# Patient Record
Sex: Female | Born: 1945
Health system: Southern US, Community
[De-identification: ages and names within clinical notes are randomized; demographics above are authoritative.]

## PROBLEM LIST (undated history)

## (undated) DIAGNOSIS — E785 Hyperlipidemia, unspecified: Secondary | ICD-10-CM

## (undated) DIAGNOSIS — M4800 Spinal stenosis, site unspecified: Secondary | ICD-10-CM

## (undated) DIAGNOSIS — I10 Essential (primary) hypertension: Secondary | ICD-10-CM

## (undated) DIAGNOSIS — F419 Anxiety disorder, unspecified: Secondary | ICD-10-CM

## (undated) DIAGNOSIS — E119 Type 2 diabetes mellitus without complications: Secondary | ICD-10-CM

## (undated) DIAGNOSIS — E039 Hypothyroidism, unspecified: Secondary | ICD-10-CM

## (undated) HISTORY — DX: Essential (primary) hypertension: I10

## (undated) HISTORY — DX: Hyperlipidemia, unspecified: E78.5

## (undated) HISTORY — DX: Type 2 diabetes mellitus without complications: E11.9

## (undated) HISTORY — DX: Spinal stenosis, site unspecified: M48.00

## (undated) HISTORY — DX: Hypothyroidism, unspecified: E03.9

## (undated) HISTORY — PX: TONSILLECTOMY: SUR1361

## (undated) HISTORY — DX: Anxiety disorder, unspecified: F41.9

## (undated) HISTORY — PX: APPENDECTOMY: SHX54

## (undated) HISTORY — PX: SALIVARY GLAND SURGERY: SHX768

---

## 1998-07-13 ENCOUNTER — Other Ambulatory Visit: Admission: RE | Admit: 1998-07-13 | Discharge: 1998-07-13 | Payer: Self-pay | Admitting: Obstetrics & Gynecology

## 2000-07-22 ENCOUNTER — Other Ambulatory Visit: Admission: RE | Admit: 2000-07-22 | Discharge: 2000-07-22 | Payer: Self-pay | Admitting: Obstetrics & Gynecology

## 2002-10-07 ENCOUNTER — Other Ambulatory Visit: Admission: RE | Admit: 2002-10-07 | Discharge: 2002-10-07 | Payer: Self-pay | Admitting: Gastroenterology

## 2003-12-04 ENCOUNTER — Other Ambulatory Visit: Admission: RE | Admit: 2003-12-04 | Discharge: 2003-12-04 | Payer: Self-pay | Admitting: Obstetrics & Gynecology

## 2005-12-09 ENCOUNTER — Encounter: Admission: RE | Admit: 2005-12-09 | Discharge: 2005-12-09 | Payer: Self-pay | Admitting: Internal Medicine

## 2006-01-09 ENCOUNTER — Encounter: Admission: RE | Admit: 2006-01-09 | Discharge: 2006-01-09 | Payer: Self-pay | Admitting: Neurosurgery

## 2006-01-27 ENCOUNTER — Encounter: Admission: RE | Admit: 2006-01-27 | Discharge: 2006-01-27 | Payer: Self-pay | Admitting: Internal Medicine

## 2006-02-12 ENCOUNTER — Encounter: Admission: RE | Admit: 2006-02-12 | Discharge: 2006-02-12 | Payer: Self-pay | Admitting: Internal Medicine

## 2006-07-13 ENCOUNTER — Encounter: Admission: RE | Admit: 2006-07-13 | Discharge: 2006-10-11 | Payer: Self-pay | Admitting: Internal Medicine

## 2006-08-26 ENCOUNTER — Encounter: Admission: RE | Admit: 2006-08-26 | Discharge: 2006-08-26 | Payer: Self-pay | Admitting: Neurosurgery

## 2006-10-06 ENCOUNTER — Encounter: Admission: RE | Admit: 2006-10-06 | Discharge: 2006-10-06 | Payer: Self-pay | Admitting: Neurosurgery

## 2007-10-12 ENCOUNTER — Emergency Department (HOSPITAL_COMMUNITY): Admission: EM | Admit: 2007-10-12 | Discharge: 2007-10-12 | Payer: Self-pay | Admitting: Emergency Medicine

## 2009-10-16 ENCOUNTER — Ambulatory Visit (HOSPITAL_COMMUNITY): Admission: RE | Admit: 2009-10-16 | Discharge: 2009-10-16 | Payer: Self-pay | Admitting: Dermatology

## 2010-01-19 ENCOUNTER — Encounter: Admission: RE | Admit: 2010-01-19 | Discharge: 2010-01-19 | Payer: Self-pay | Admitting: Rheumatology

## 2010-06-14 ENCOUNTER — Encounter: Admission: RE | Admit: 2010-06-14 | Discharge: 2010-06-14 | Payer: Self-pay | Admitting: Internal Medicine

## 2010-06-24 ENCOUNTER — Encounter: Admission: RE | Admit: 2010-06-24 | Discharge: 2010-06-24 | Payer: Self-pay | Admitting: Internal Medicine

## 2010-07-17 ENCOUNTER — Ambulatory Visit: Payer: Self-pay | Admitting: Obstetrics and Gynecology

## 2011-05-01 ENCOUNTER — Inpatient Hospital Stay (INDEPENDENT_AMBULATORY_CARE_PROVIDER_SITE_OTHER)
Admission: RE | Admit: 2011-05-01 | Discharge: 2011-05-01 | Disposition: A | Discharge status: Home / Self Care | Payer: Self-pay | Source: Ambulatory Visit | Attending: Emergency Medicine | Admitting: Emergency Medicine

## 2011-05-01 DIAGNOSIS — S139XXA Sprain of joints and ligaments of unspecified parts of neck, initial encounter: Secondary | ICD-10-CM

## 2011-05-01 DIAGNOSIS — M461 Sacroiliitis, not elsewhere classified: Secondary | ICD-10-CM

## 2011-12-18 ENCOUNTER — Other Ambulatory Visit (HOSPITAL_COMMUNITY): Payer: Self-pay | Admitting: Internal Medicine

## 2011-12-18 DIAGNOSIS — J449 Chronic obstructive pulmonary disease, unspecified: Secondary | ICD-10-CM

## 2011-12-25 ENCOUNTER — Ambulatory Visit (HOSPITAL_COMMUNITY)
Admission: RE | Admit: 2011-12-25 | Discharge: 2011-12-25 | Disposition: A | Discharge status: Home / Self Care | Payer: Medicare Other | Source: Ambulatory Visit | Attending: Internal Medicine | Admitting: Internal Medicine

## 2011-12-25 ENCOUNTER — Encounter (HOSPITAL_COMMUNITY): Payer: Self-pay

## 2011-12-25 DIAGNOSIS — J449 Chronic obstructive pulmonary disease, unspecified: Secondary | ICD-10-CM | POA: Insufficient documentation

## 2011-12-25 DIAGNOSIS — J4489 Other specified chronic obstructive pulmonary disease: Secondary | ICD-10-CM | POA: Insufficient documentation

## 2011-12-25 DIAGNOSIS — F17200 Nicotine dependence, unspecified, uncomplicated: Secondary | ICD-10-CM | POA: Insufficient documentation

## 2012-02-24 ENCOUNTER — Other Ambulatory Visit: Payer: Self-pay | Admitting: Gastroenterology

## 2012-08-13 ENCOUNTER — Other Ambulatory Visit: Payer: Self-pay | Admitting: Internal Medicine

## 2012-08-13 DIAGNOSIS — M549 Dorsalgia, unspecified: Secondary | ICD-10-CM

## 2012-08-19 ENCOUNTER — Ambulatory Visit
Admission: RE | Admit: 2012-08-19 | Discharge: 2012-08-19 | Disposition: A | Discharge status: Home / Self Care | Payer: Medicare Other | Source: Ambulatory Visit | Attending: Internal Medicine | Admitting: Internal Medicine

## 2012-08-19 VITALS — BP 114/63 | HR 82

## 2012-08-19 DIAGNOSIS — M5126 Other intervertebral disc displacement, lumbar region: Secondary | ICD-10-CM

## 2012-08-19 DIAGNOSIS — M549 Dorsalgia, unspecified: Secondary | ICD-10-CM

## 2012-08-19 MED ORDER — METHYLPREDNISOLONE ACETATE 40 MG/ML INJ SUSP (RADIOLOG
120.0000 mg | Freq: Once | INTRAMUSCULAR | Status: AC
  Administered 2012-08-19: 120 mg via EPIDURAL

## 2012-08-19 MED ORDER — IOHEXOL 180 MG/ML  SOLN
1.0000 mL | Freq: Once | INTRAMUSCULAR | Status: AC | PRN
  Administered 2012-08-19: 1 mL via EPIDURAL

## 2013-06-20 DIAGNOSIS — I10 Essential (primary) hypertension: Secondary | ICD-10-CM | POA: Insufficient documentation

## 2013-06-20 DIAGNOSIS — M4802 Spinal stenosis, cervical region: Secondary | ICD-10-CM | POA: Insufficient documentation

## 2013-06-20 DIAGNOSIS — E785 Hyperlipidemia, unspecified: Secondary | ICD-10-CM | POA: Insufficient documentation

## 2013-06-20 DIAGNOSIS — M4800 Spinal stenosis, site unspecified: Secondary | ICD-10-CM | POA: Insufficient documentation

## 2013-06-20 DIAGNOSIS — F419 Anxiety disorder, unspecified: Secondary | ICD-10-CM | POA: Insufficient documentation

## 2014-02-02 ENCOUNTER — Other Ambulatory Visit: Payer: Self-pay | Admitting: Orthopedic Surgery

## 2014-02-02 DIAGNOSIS — M545 Low back pain, unspecified: Secondary | ICD-10-CM

## 2014-02-21 ENCOUNTER — Other Ambulatory Visit: Payer: Self-pay | Admitting: Internal Medicine

## 2014-02-21 DIAGNOSIS — M549 Dorsalgia, unspecified: Secondary | ICD-10-CM

## 2014-02-28 ENCOUNTER — Other Ambulatory Visit: Payer: Medicare Other

## 2014-03-03 ENCOUNTER — Ambulatory Visit
Admission: RE | Admit: 2014-03-03 | Discharge: 2014-03-03 | Disposition: A | Discharge status: Home / Self Care | Payer: Commercial Managed Care - HMO | Source: Ambulatory Visit | Attending: Internal Medicine | Admitting: Internal Medicine

## 2014-03-03 DIAGNOSIS — M549 Dorsalgia, unspecified: Secondary | ICD-10-CM

## 2014-03-03 MED ORDER — METHYLPREDNISOLONE ACETATE 40 MG/ML INJ SUSP (RADIOLOG
120.0000 mg | Freq: Once | INTRAMUSCULAR | Status: AC
  Administered 2014-03-03: 120 mg via EPIDURAL

## 2014-03-03 MED ORDER — IOHEXOL 180 MG/ML  SOLN
1.0000 mL | Freq: Once | INTRAMUSCULAR | Status: AC | PRN
  Administered 2014-03-03 (×2): 1 mL via EPIDURAL

## 2014-03-03 NOTE — Discharge Instructions (Signed)

## 2014-04-19 ENCOUNTER — Encounter: Payer: Self-pay | Admitting: *Deleted

## 2014-11-09 ENCOUNTER — Other Ambulatory Visit: Payer: Self-pay | Admitting: Internal Medicine

## 2014-11-09 DIAGNOSIS — M5412 Radiculopathy, cervical region: Secondary | ICD-10-CM

## 2014-11-09 DIAGNOSIS — M545 Low back pain: Secondary | ICD-10-CM | POA: Diagnosis not present

## 2014-11-13 ENCOUNTER — Ambulatory Visit
Admission: RE | Admit: 2014-11-13 | Discharge: 2014-11-13 | Disposition: A | Discharge status: Home / Self Care | Payer: Commercial Managed Care - HMO | Source: Ambulatory Visit | Attending: Internal Medicine | Admitting: Internal Medicine

## 2014-11-13 DIAGNOSIS — M47812 Spondylosis without myelopathy or radiculopathy, cervical region: Secondary | ICD-10-CM | POA: Diagnosis not present

## 2014-11-13 DIAGNOSIS — S199XXA Unspecified injury of neck, initial encounter: Secondary | ICD-10-CM | POA: Diagnosis not present

## 2014-11-13 DIAGNOSIS — M5412 Radiculopathy, cervical region: Secondary | ICD-10-CM

## 2014-11-13 DIAGNOSIS — M5032 Other cervical disc degeneration, mid-cervical region: Secondary | ICD-10-CM | POA: Diagnosis not present

## 2014-11-13 DIAGNOSIS — M5033 Other cervical disc degeneration, cervicothoracic region: Secondary | ICD-10-CM | POA: Diagnosis not present

## 2014-11-16 ENCOUNTER — Other Ambulatory Visit: Payer: Medicare Other

## 2014-11-27 DIAGNOSIS — M542 Cervicalgia: Secondary | ICD-10-CM | POA: Diagnosis not present

## 2014-11-27 DIAGNOSIS — M069 Rheumatoid arthritis, unspecified: Secondary | ICD-10-CM | POA: Diagnosis not present

## 2014-11-27 DIAGNOSIS — I10 Essential (primary) hypertension: Secondary | ICD-10-CM | POA: Diagnosis not present

## 2014-11-29 ENCOUNTER — Other Ambulatory Visit: Payer: Self-pay | Admitting: Internal Medicine

## 2014-11-29 DIAGNOSIS — M48061 Spinal stenosis, lumbar region without neurogenic claudication: Secondary | ICD-10-CM

## 2014-12-01 ENCOUNTER — Ambulatory Visit
Admission: RE | Admit: 2014-12-01 | Discharge: 2014-12-01 | Disposition: A | Discharge status: Home / Self Care | Payer: Commercial Managed Care - HMO | Source: Ambulatory Visit | Attending: Internal Medicine | Admitting: Internal Medicine

## 2014-12-01 DIAGNOSIS — M48061 Spinal stenosis, lumbar region without neurogenic claudication: Secondary | ICD-10-CM

## 2014-12-01 DIAGNOSIS — M4806 Spinal stenosis, lumbar region: Secondary | ICD-10-CM | POA: Diagnosis not present

## 2014-12-01 MED ORDER — IOHEXOL 180 MG/ML  SOLN
1.0000 mL | Freq: Once | INTRAMUSCULAR | Status: AC | PRN
  Administered 2014-12-01: 1 mL via EPIDURAL

## 2014-12-01 MED ORDER — METHYLPREDNISOLONE ACETATE 40 MG/ML INJ SUSP (RADIOLOG
120.0000 mg | Freq: Once | INTRAMUSCULAR | Status: AC
  Administered 2014-12-01: 120 mg via EPIDURAL

## 2014-12-01 NOTE — Discharge Instructions (Signed)

## 2014-12-25 DIAGNOSIS — M205X1 Other deformities of toe(s) (acquired), right foot: Secondary | ICD-10-CM | POA: Diagnosis not present

## 2014-12-25 DIAGNOSIS — M205X2 Other deformities of toe(s) (acquired), left foot: Secondary | ICD-10-CM | POA: Diagnosis not present

## 2014-12-25 DIAGNOSIS — Z131 Encounter for screening for diabetes mellitus: Secondary | ICD-10-CM | POA: Diagnosis not present

## 2014-12-25 DIAGNOSIS — M1990 Unspecified osteoarthritis, unspecified site: Secondary | ICD-10-CM | POA: Diagnosis not present

## 2014-12-25 DIAGNOSIS — L97519 Non-pressure chronic ulcer of other part of right foot with unspecified severity: Secondary | ICD-10-CM | POA: Diagnosis not present

## 2014-12-29 ENCOUNTER — Other Ambulatory Visit: Payer: Self-pay | Admitting: Neurosurgery

## 2014-12-29 DIAGNOSIS — M542 Cervicalgia: Secondary | ICD-10-CM

## 2015-01-05 ENCOUNTER — Ambulatory Visit
Admission: RE | Admit: 2015-01-05 | Discharge: 2015-01-05 | Disposition: A | Discharge status: Home / Self Care | Payer: Commercial Managed Care - HMO | Source: Ambulatory Visit | Attending: Neurosurgery | Admitting: Neurosurgery

## 2015-01-05 DIAGNOSIS — M542 Cervicalgia: Secondary | ICD-10-CM

## 2015-01-05 MED ORDER — IOHEXOL 300 MG/ML  SOLN
1.0000 mL | Freq: Once | INTRAMUSCULAR | Status: AC | PRN
  Administered 2015-01-05: 1 mL via EPIDURAL

## 2015-01-05 MED ORDER — TRIAMCINOLONE ACETONIDE 40 MG/ML IJ SUSP (RADIOLOGY)
60.0000 mg | Freq: Once | INTRAMUSCULAR | Status: AC
  Administered 2015-01-05: 60 mg via EPIDURAL

## 2015-01-05 NOTE — Discharge Instructions (Signed)

## 2015-02-28 DIAGNOSIS — M1990 Unspecified osteoarthritis, unspecified site: Secondary | ICD-10-CM | POA: Diagnosis not present

## 2015-02-28 DIAGNOSIS — Z0000 Encounter for general adult medical examination without abnormal findings: Secondary | ICD-10-CM | POA: Diagnosis not present

## 2015-02-28 DIAGNOSIS — K219 Gastro-esophageal reflux disease without esophagitis: Secondary | ICD-10-CM | POA: Diagnosis not present

## 2015-02-28 DIAGNOSIS — J449 Chronic obstructive pulmonary disease, unspecified: Secondary | ICD-10-CM | POA: Diagnosis not present

## 2015-02-28 DIAGNOSIS — F325 Major depressive disorder, single episode, in full remission: Secondary | ICD-10-CM | POA: Diagnosis not present

## 2015-02-28 DIAGNOSIS — E039 Hypothyroidism, unspecified: Secondary | ICD-10-CM | POA: Diagnosis not present

## 2015-02-28 DIAGNOSIS — M797 Fibromyalgia: Secondary | ICD-10-CM | POA: Diagnosis not present

## 2015-02-28 DIAGNOSIS — I10 Essential (primary) hypertension: Secondary | ICD-10-CM | POA: Diagnosis not present

## 2015-04-11 DIAGNOSIS — H524 Presbyopia: Secondary | ICD-10-CM | POA: Diagnosis not present

## 2015-04-11 DIAGNOSIS — H5210 Myopia, unspecified eye: Secondary | ICD-10-CM | POA: Diagnosis not present

## 2015-05-29 DIAGNOSIS — Z1231 Encounter for screening mammogram for malignant neoplasm of breast: Secondary | ICD-10-CM | POA: Diagnosis not present

## 2015-09-04 DIAGNOSIS — F419 Anxiety disorder, unspecified: Secondary | ICD-10-CM | POA: Diagnosis not present

## 2015-09-04 DIAGNOSIS — E782 Mixed hyperlipidemia: Secondary | ICD-10-CM | POA: Diagnosis not present

## 2015-09-04 DIAGNOSIS — I10 Essential (primary) hypertension: Secondary | ICD-10-CM | POA: Diagnosis not present

## 2015-09-04 DIAGNOSIS — J309 Allergic rhinitis, unspecified: Secondary | ICD-10-CM | POA: Diagnosis not present

## 2015-09-04 DIAGNOSIS — F325 Major depressive disorder, single episode, in full remission: Secondary | ICD-10-CM | POA: Diagnosis not present

## 2015-09-04 DIAGNOSIS — E039 Hypothyroidism, unspecified: Secondary | ICD-10-CM | POA: Diagnosis not present

## 2015-09-04 DIAGNOSIS — J449 Chronic obstructive pulmonary disease, unspecified: Secondary | ICD-10-CM | POA: Diagnosis not present

## 2015-11-01 DIAGNOSIS — M5431 Sciatica, right side: Secondary | ICD-10-CM | POA: Diagnosis not present

## 2015-11-01 DIAGNOSIS — M9903 Segmental and somatic dysfunction of lumbar region: Secondary | ICD-10-CM | POA: Diagnosis not present

## 2015-11-01 DIAGNOSIS — M546 Pain in thoracic spine: Secondary | ICD-10-CM | POA: Diagnosis not present

## 2015-11-01 DIAGNOSIS — M9902 Segmental and somatic dysfunction of thoracic region: Secondary | ICD-10-CM | POA: Diagnosis not present

## 2015-11-08 DIAGNOSIS — M9902 Segmental and somatic dysfunction of thoracic region: Secondary | ICD-10-CM | POA: Diagnosis not present

## 2015-11-08 DIAGNOSIS — M9903 Segmental and somatic dysfunction of lumbar region: Secondary | ICD-10-CM | POA: Diagnosis not present

## 2015-11-08 DIAGNOSIS — M546 Pain in thoracic spine: Secondary | ICD-10-CM | POA: Diagnosis not present

## 2015-11-08 DIAGNOSIS — M5431 Sciatica, right side: Secondary | ICD-10-CM | POA: Diagnosis not present

## 2015-11-15 DIAGNOSIS — M546 Pain in thoracic spine: Secondary | ICD-10-CM | POA: Diagnosis not present

## 2015-11-15 DIAGNOSIS — M9902 Segmental and somatic dysfunction of thoracic region: Secondary | ICD-10-CM | POA: Diagnosis not present

## 2015-11-15 DIAGNOSIS — M5431 Sciatica, right side: Secondary | ICD-10-CM | POA: Diagnosis not present

## 2015-11-15 DIAGNOSIS — M9903 Segmental and somatic dysfunction of lumbar region: Secondary | ICD-10-CM | POA: Diagnosis not present

## 2015-11-22 DIAGNOSIS — M9903 Segmental and somatic dysfunction of lumbar region: Secondary | ICD-10-CM | POA: Diagnosis not present

## 2015-11-22 DIAGNOSIS — M546 Pain in thoracic spine: Secondary | ICD-10-CM | POA: Diagnosis not present

## 2015-11-22 DIAGNOSIS — M5431 Sciatica, right side: Secondary | ICD-10-CM | POA: Diagnosis not present

## 2015-11-22 DIAGNOSIS — M9902 Segmental and somatic dysfunction of thoracic region: Secondary | ICD-10-CM | POA: Diagnosis not present

## 2015-12-06 DIAGNOSIS — M546 Pain in thoracic spine: Secondary | ICD-10-CM | POA: Diagnosis not present

## 2015-12-06 DIAGNOSIS — M9902 Segmental and somatic dysfunction of thoracic region: Secondary | ICD-10-CM | POA: Diagnosis not present

## 2015-12-06 DIAGNOSIS — M5431 Sciatica, right side: Secondary | ICD-10-CM | POA: Diagnosis not present

## 2015-12-06 DIAGNOSIS — M9903 Segmental and somatic dysfunction of lumbar region: Secondary | ICD-10-CM | POA: Diagnosis not present

## 2016-01-02 DIAGNOSIS — M546 Pain in thoracic spine: Secondary | ICD-10-CM | POA: Diagnosis not present

## 2016-01-02 DIAGNOSIS — M9903 Segmental and somatic dysfunction of lumbar region: Secondary | ICD-10-CM | POA: Diagnosis not present

## 2016-01-02 DIAGNOSIS — M5431 Sciatica, right side: Secondary | ICD-10-CM | POA: Diagnosis not present

## 2016-01-02 DIAGNOSIS — M9902 Segmental and somatic dysfunction of thoracic region: Secondary | ICD-10-CM | POA: Diagnosis not present

## 2016-01-17 DIAGNOSIS — M9903 Segmental and somatic dysfunction of lumbar region: Secondary | ICD-10-CM | POA: Diagnosis not present

## 2016-01-17 DIAGNOSIS — M9902 Segmental and somatic dysfunction of thoracic region: Secondary | ICD-10-CM | POA: Diagnosis not present

## 2016-01-17 DIAGNOSIS — M546 Pain in thoracic spine: Secondary | ICD-10-CM | POA: Diagnosis not present

## 2016-01-17 DIAGNOSIS — M5431 Sciatica, right side: Secondary | ICD-10-CM | POA: Diagnosis not present

## 2016-01-23 DIAGNOSIS — M542 Cervicalgia: Secondary | ICD-10-CM | POA: Diagnosis not present

## 2016-01-23 DIAGNOSIS — M5412 Radiculopathy, cervical region: Secondary | ICD-10-CM | POA: Diagnosis not present

## 2016-01-24 ENCOUNTER — Other Ambulatory Visit: Payer: Self-pay | Admitting: Nurse Practitioner

## 2016-01-24 DIAGNOSIS — M5412 Radiculopathy, cervical region: Secondary | ICD-10-CM

## 2016-01-24 DIAGNOSIS — M542 Cervicalgia: Secondary | ICD-10-CM

## 2016-02-04 ENCOUNTER — Ambulatory Visit
Admission: RE | Admit: 2016-02-04 | Discharge: 2016-02-04 | Disposition: A | Discharge status: Home / Self Care | Payer: Commercial Managed Care - HMO | Source: Ambulatory Visit | Attending: Nurse Practitioner | Admitting: Nurse Practitioner

## 2016-02-04 ENCOUNTER — Encounter: Payer: Self-pay | Admitting: Radiology

## 2016-02-04 DIAGNOSIS — M47812 Spondylosis without myelopathy or radiculopathy, cervical region: Secondary | ICD-10-CM | POA: Diagnosis not present

## 2016-02-04 DIAGNOSIS — M542 Cervicalgia: Secondary | ICD-10-CM

## 2016-02-04 DIAGNOSIS — M5412 Radiculopathy, cervical region: Secondary | ICD-10-CM

## 2016-02-04 MED ORDER — IOHEXOL 300 MG/ML  SOLN
1.0000 mL | Freq: Once | INTRAMUSCULAR | Status: AC | PRN
  Administered 2016-02-04: 1 mL via EPIDURAL

## 2016-02-04 MED ORDER — TRIAMCINOLONE ACETONIDE 40 MG/ML IJ SUSP (RADIOLOGY)
60.0000 mg | Freq: Once | INTRAMUSCULAR | Status: AC
  Administered 2016-02-04: 60 mg via EPIDURAL

## 2016-02-04 NOTE — Discharge Instructions (Signed)

## 2016-02-14 DIAGNOSIS — M546 Pain in thoracic spine: Secondary | ICD-10-CM | POA: Diagnosis not present

## 2016-02-14 DIAGNOSIS — M9902 Segmental and somatic dysfunction of thoracic region: Secondary | ICD-10-CM | POA: Diagnosis not present

## 2016-02-14 DIAGNOSIS — M5431 Sciatica, right side: Secondary | ICD-10-CM | POA: Diagnosis not present

## 2016-02-14 DIAGNOSIS — M9903 Segmental and somatic dysfunction of lumbar region: Secondary | ICD-10-CM | POA: Diagnosis not present

## 2016-02-18 ENCOUNTER — Other Ambulatory Visit: Payer: Commercial Managed Care - HMO

## 2016-03-05 DIAGNOSIS — Z0000 Encounter for general adult medical examination without abnormal findings: Secondary | ICD-10-CM | POA: Diagnosis not present

## 2016-03-05 DIAGNOSIS — K219 Gastro-esophageal reflux disease without esophagitis: Secondary | ICD-10-CM | POA: Diagnosis not present

## 2016-03-05 DIAGNOSIS — M5090 Cervical disc disorder, unspecified, unspecified cervical region: Secondary | ICD-10-CM | POA: Diagnosis not present

## 2016-03-05 DIAGNOSIS — M797 Fibromyalgia: Secondary | ICD-10-CM | POA: Diagnosis not present

## 2016-03-05 DIAGNOSIS — R739 Hyperglycemia, unspecified: Secondary | ICD-10-CM | POA: Diagnosis not present

## 2016-03-05 DIAGNOSIS — Z1389 Encounter for screening for other disorder: Secondary | ICD-10-CM | POA: Diagnosis not present

## 2016-03-05 DIAGNOSIS — I10 Essential (primary) hypertension: Secondary | ICD-10-CM | POA: Diagnosis not present

## 2016-03-05 DIAGNOSIS — J449 Chronic obstructive pulmonary disease, unspecified: Secondary | ICD-10-CM | POA: Diagnosis not present

## 2016-03-05 DIAGNOSIS — F325 Major depressive disorder, single episode, in full remission: Secondary | ICD-10-CM | POA: Diagnosis not present

## 2016-03-05 DIAGNOSIS — E782 Mixed hyperlipidemia: Secondary | ICD-10-CM | POA: Diagnosis not present

## 2016-03-05 DIAGNOSIS — E039 Hypothyroidism, unspecified: Secondary | ICD-10-CM | POA: Diagnosis not present

## 2016-03-05 DIAGNOSIS — F419 Anxiety disorder, unspecified: Secondary | ICD-10-CM | POA: Diagnosis not present

## 2016-03-21 DIAGNOSIS — M9903 Segmental and somatic dysfunction of lumbar region: Secondary | ICD-10-CM | POA: Diagnosis not present

## 2016-03-21 DIAGNOSIS — M546 Pain in thoracic spine: Secondary | ICD-10-CM | POA: Diagnosis not present

## 2016-03-21 DIAGNOSIS — M9902 Segmental and somatic dysfunction of thoracic region: Secondary | ICD-10-CM | POA: Diagnosis not present

## 2016-03-21 DIAGNOSIS — M5431 Sciatica, right side: Secondary | ICD-10-CM | POA: Diagnosis not present

## 2016-04-22 DIAGNOSIS — M546 Pain in thoracic spine: Secondary | ICD-10-CM | POA: Diagnosis not present

## 2016-04-22 DIAGNOSIS — M9902 Segmental and somatic dysfunction of thoracic region: Secondary | ICD-10-CM | POA: Diagnosis not present

## 2016-04-22 DIAGNOSIS — M9903 Segmental and somatic dysfunction of lumbar region: Secondary | ICD-10-CM | POA: Diagnosis not present

## 2016-04-22 DIAGNOSIS — M5431 Sciatica, right side: Secondary | ICD-10-CM | POA: Diagnosis not present

## 2016-05-06 DIAGNOSIS — H5210 Myopia, unspecified eye: Secondary | ICD-10-CM | POA: Diagnosis not present

## 2016-05-06 DIAGNOSIS — H524 Presbyopia: Secondary | ICD-10-CM | POA: Diagnosis not present

## 2016-05-09 LAB — GLUCOSE, POCT (MANUAL RESULT ENTRY): POC Glucose: 104 mg/dl — AB (ref 70–99)

## 2016-05-26 DIAGNOSIS — M5431 Sciatica, right side: Secondary | ICD-10-CM | POA: Diagnosis not present

## 2016-05-26 DIAGNOSIS — M9903 Segmental and somatic dysfunction of lumbar region: Secondary | ICD-10-CM | POA: Diagnosis not present

## 2016-05-26 DIAGNOSIS — M546 Pain in thoracic spine: Secondary | ICD-10-CM | POA: Diagnosis not present

## 2016-05-26 DIAGNOSIS — M9902 Segmental and somatic dysfunction of thoracic region: Secondary | ICD-10-CM | POA: Diagnosis not present

## 2016-05-29 DIAGNOSIS — Z1231 Encounter for screening mammogram for malignant neoplasm of breast: Secondary | ICD-10-CM | POA: Diagnosis not present

## 2016-06-01 IMAGING — MR MR CERVICAL SPINE W/O CM
4 of 5 series · 21 of 48 positions shown · non-contrast
Comparison: Cervical radiographs 05/30/2010.  MRI 08/26/2006

CLINICAL DATA: Neck pain.  Recent fall.  Cervical radiculopathy

EXAM:
MRI CERVICAL SPINE WITHOUT CONTRAST
TECHNIQUE: Multiplanar, multisequence MR imaging of the cervical spine was
performed. No intravenous contrast was administered.

[Series 2: T2 · sagittal · 3.0mm · 0.41mm/px · 6 of 12 slices shown (1 of 3)]
[im 1/12]
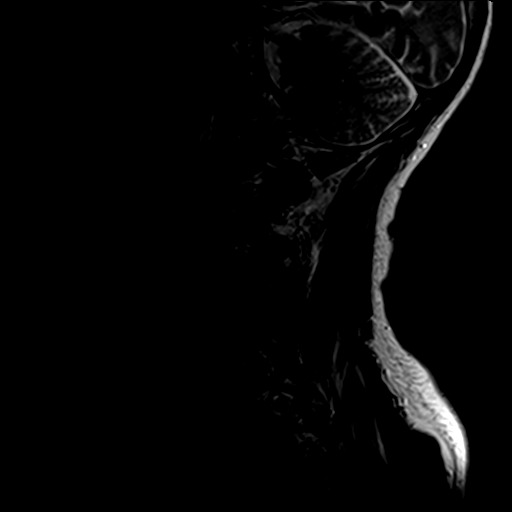
[im 3/12]
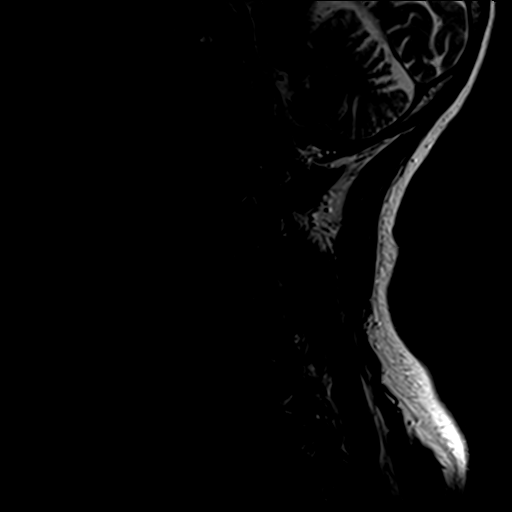
[im 5/12]
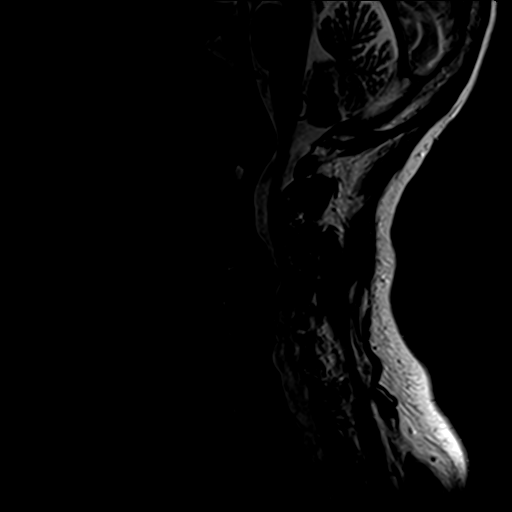
[im 7/12]
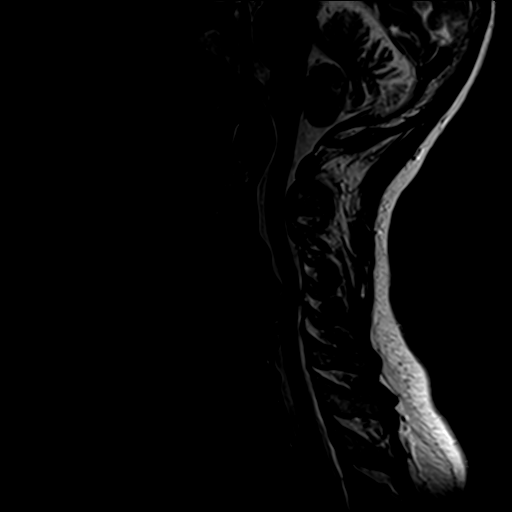
[im 9/12]
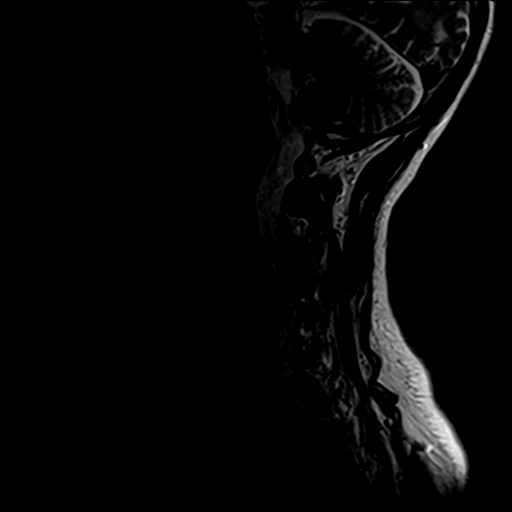
[im 12/12]
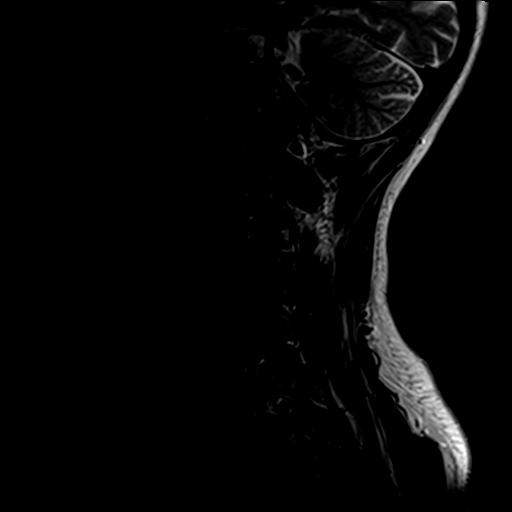

[Series 3: T1 · sagittal · 3.0mm · 0.41mm/px · 3 of 12 slices shown]
[im 3/12]
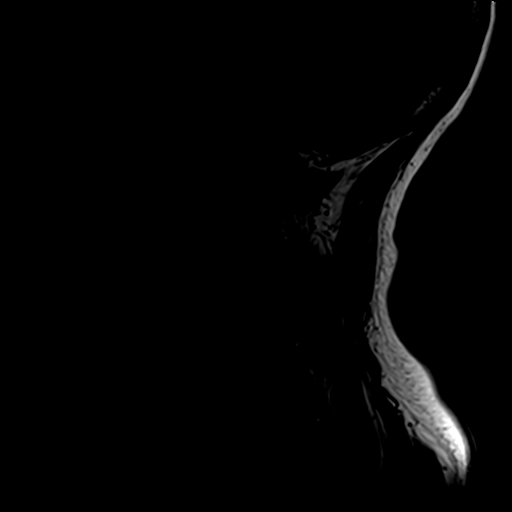
[im 7/12]
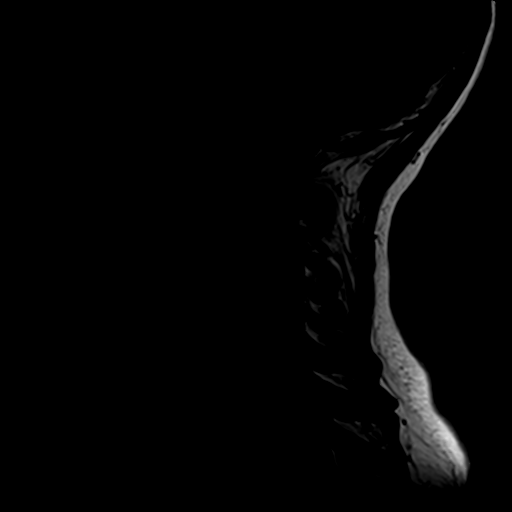
[im 12/12]
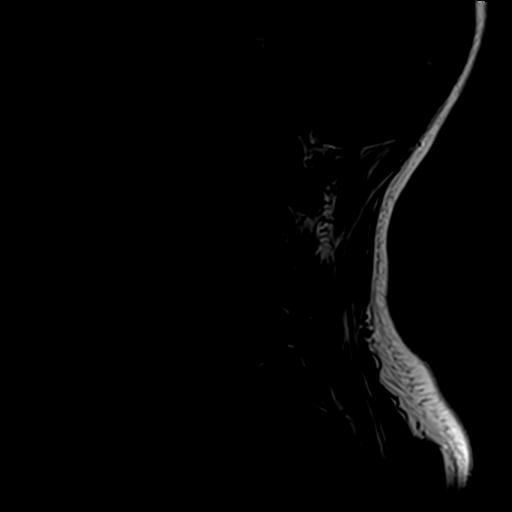

[Series 5: T2 · axial · 3.0mm · 0.28mm/px · z∈[-54,+49]mm · 9 of 30 slices shown (2 of 3)]
[im 1/30]
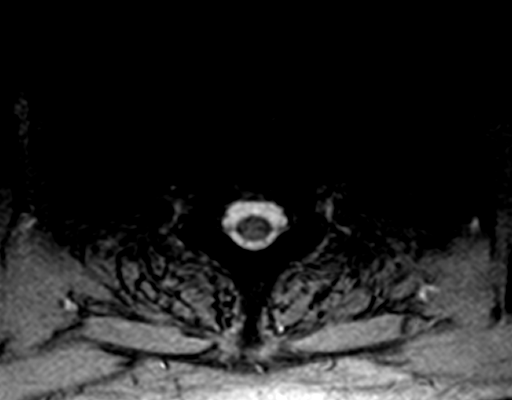
[im 5/30]
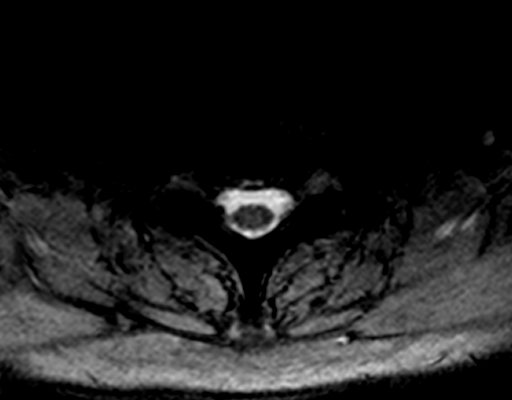
[im 9/30]
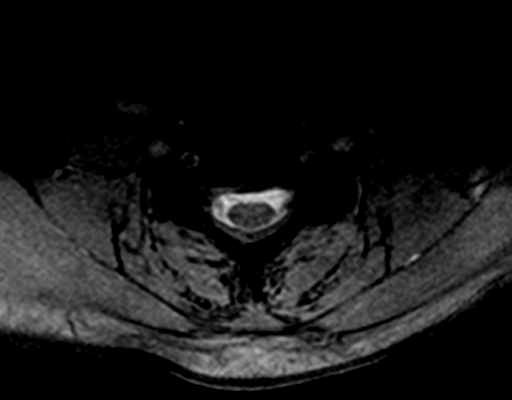
[im 13/30]
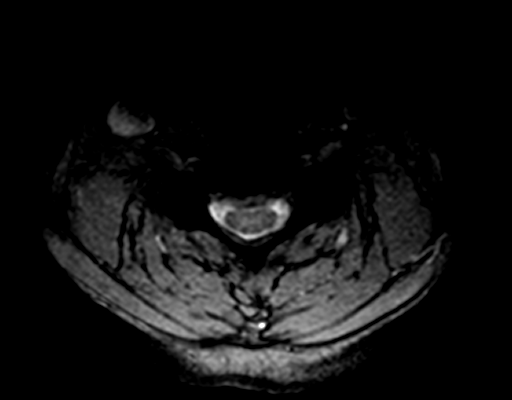
[im 15/30]
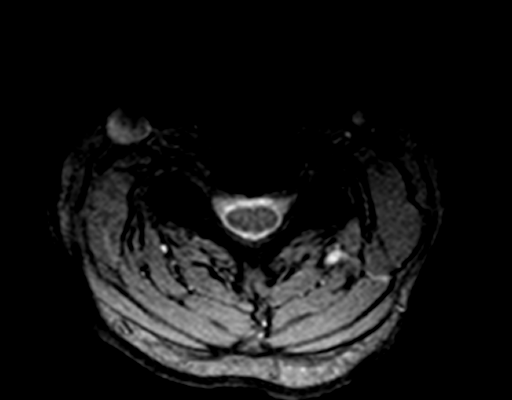
[im 17/30]
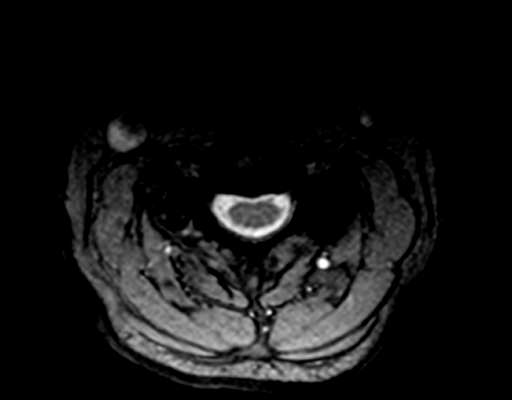
[im 21/30]
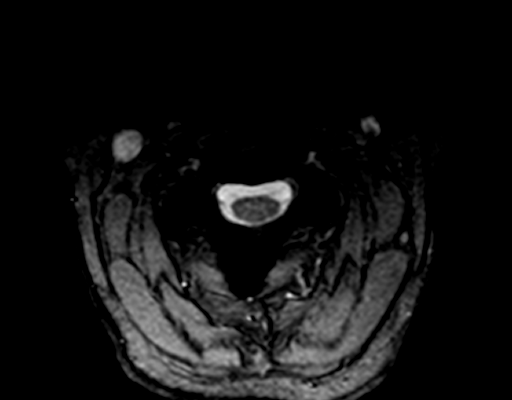
[im 25/30]
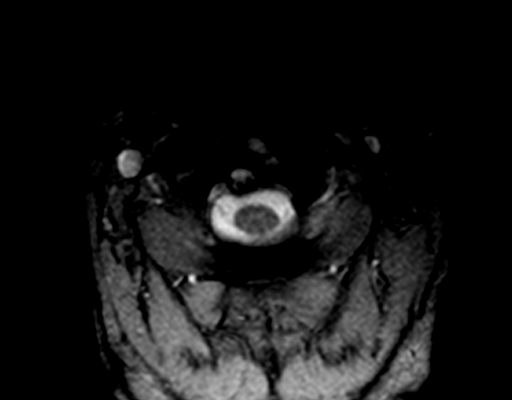
[im 30/30]
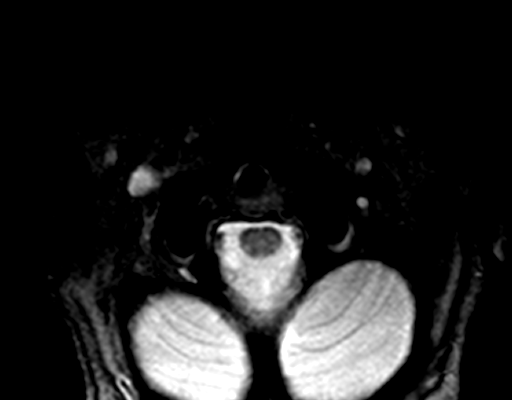

[Series 6: T2 · axial · 3.0mm · 0.28mm/px · z∈[-40,+31]mm · 3 of 30 slices shown (3 of 3)]
[im 5/30]
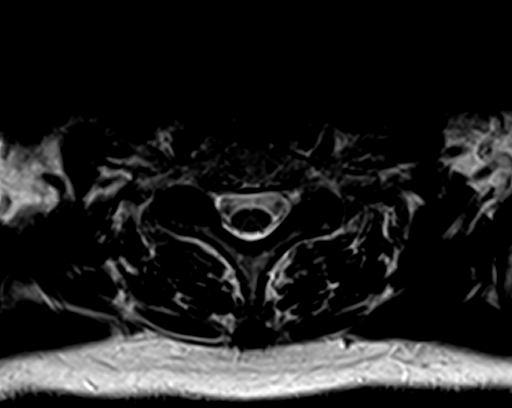
[im 15/30]
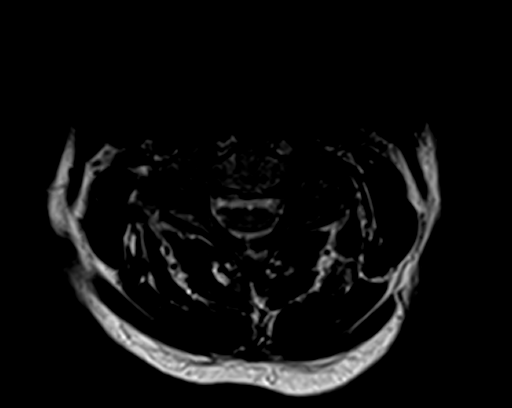
[im 25/30]
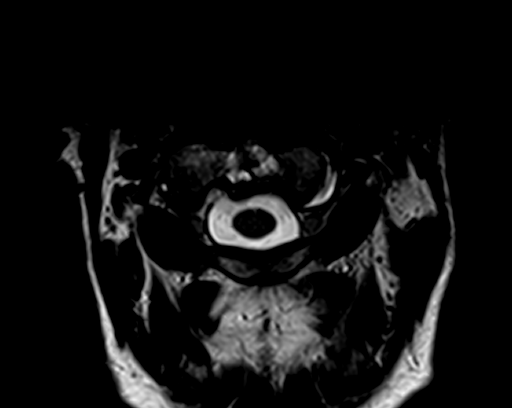

[21 of 48 positions shown; findings below may reference images not displayed]

FINDINGS: 17 mm cystic lesion in the C2 vertebral body at the junction of the
body and dens. This has high signal on T2 and intermediate signal on
T1. Margins are ill-defined. No significant extraosseous component
extending into the canal. However, there is extensive thickening of
the transverse ligament of C1 with posterior displacement of the
cord. No cord compression. Review of the MRI in 4887 reveals 3mm
cyst in this area of C2 with significant interval growth. No other
bone marrow lesions identified.

Cervical disc degeneration and kyphosis.  Cord signal is normal.

C2-3:  Negative

C3-4: 2 mm anterior slip with disc and facet degeneration. No cord
deformity

C4-5: Disc degeneration with disc bulging and spondylosis. Mild to
moderate spinal stenosis and foraminal stenosis bilaterally.

C5-6: Disc degeneration and spondylosis with mild spinal stenosis
and mild foraminal stenosis bilaterally.

C6-7: Moderate disc degeneration and spondylosis. Mild spinal
stenosis and foraminal stenosis bilaterally

C7-T1:  Mild disc degeneration
IMPRESSION: 17 mm cystic lesion in the C2 vertebral body. This may have been
present in 4887 as a very small lesion. Differential includes and
rheumatoid arthritis with bony erosion. Myeloma and metastatic
disease also in the differential. Transverse ligament hypertrophy is
significant and may be related to rheumatoid arthritis. Followup MRI
with intravenous contrast suggested to evaluate for abnormal
enhancement. CT of the cervical spine also suggested to evaluate
bony margins

Multilevel cervical disc degeneration and spondylosis as above.

## 2016-06-05 ENCOUNTER — Other Ambulatory Visit: Payer: Self-pay | Admitting: Internal Medicine

## 2016-06-05 DIAGNOSIS — M519 Unspecified thoracic, thoracolumbar and lumbosacral intervertebral disc disorder: Secondary | ICD-10-CM

## 2016-06-12 ENCOUNTER — Other Ambulatory Visit: Payer: Self-pay | Admitting: Internal Medicine

## 2016-06-12 DIAGNOSIS — M519 Unspecified thoracic, thoracolumbar and lumbosacral intervertebral disc disorder: Secondary | ICD-10-CM

## 2016-06-23 ENCOUNTER — Ambulatory Visit
Admission: RE | Admit: 2016-06-23 | Discharge: 2016-06-23 | Disposition: A | Discharge status: Home / Self Care | Payer: Commercial Managed Care - HMO | Source: Ambulatory Visit | Attending: Internal Medicine | Admitting: Internal Medicine

## 2016-06-23 DIAGNOSIS — M519 Unspecified thoracic, thoracolumbar and lumbosacral intervertebral disc disorder: Secondary | ICD-10-CM

## 2016-06-23 DIAGNOSIS — M4806 Spinal stenosis, lumbar region: Secondary | ICD-10-CM | POA: Diagnosis not present

## 2016-06-23 MED ORDER — IOPAMIDOL (ISOVUE-M 200) INJECTION 41%
1.0000 mL | Freq: Once | INTRAMUSCULAR | Status: AC
  Administered 2016-06-23: 1 mL via EPIDURAL

## 2016-06-23 MED ORDER — METHYLPREDNISOLONE ACETATE 40 MG/ML INJ SUSP (RADIOLOG
120.0000 mg | Freq: Once | INTRAMUSCULAR | Status: AC
  Administered 2016-06-23: 120 mg via EPIDURAL

## 2016-07-10 DIAGNOSIS — Z23 Encounter for immunization: Secondary | ICD-10-CM | POA: Diagnosis not present

## 2016-08-21 DIAGNOSIS — M546 Pain in thoracic spine: Secondary | ICD-10-CM | POA: Diagnosis not present

## 2016-08-21 DIAGNOSIS — M5431 Sciatica, right side: Secondary | ICD-10-CM | POA: Diagnosis not present

## 2016-08-21 DIAGNOSIS — M9903 Segmental and somatic dysfunction of lumbar region: Secondary | ICD-10-CM | POA: Diagnosis not present

## 2016-08-21 DIAGNOSIS — M9902 Segmental and somatic dysfunction of thoracic region: Secondary | ICD-10-CM | POA: Diagnosis not present

## 2016-09-12 DIAGNOSIS — F419 Anxiety disorder, unspecified: Secondary | ICD-10-CM | POA: Diagnosis not present

## 2016-09-12 DIAGNOSIS — M519 Unspecified thoracic, thoracolumbar and lumbosacral intervertebral disc disorder: Secondary | ICD-10-CM | POA: Diagnosis not present

## 2016-09-12 DIAGNOSIS — E039 Hypothyroidism, unspecified: Secondary | ICD-10-CM | POA: Diagnosis not present

## 2016-09-12 DIAGNOSIS — I10 Essential (primary) hypertension: Secondary | ICD-10-CM | POA: Diagnosis not present

## 2016-09-12 DIAGNOSIS — K219 Gastro-esophageal reflux disease without esophagitis: Secondary | ICD-10-CM | POA: Diagnosis not present

## 2016-09-12 DIAGNOSIS — F325 Major depressive disorder, single episode, in full remission: Secondary | ICD-10-CM | POA: Diagnosis not present

## 2016-09-12 DIAGNOSIS — M797 Fibromyalgia: Secondary | ICD-10-CM | POA: Diagnosis not present

## 2016-09-12 DIAGNOSIS — J449 Chronic obstructive pulmonary disease, unspecified: Secondary | ICD-10-CM | POA: Diagnosis not present

## 2016-09-12 DIAGNOSIS — I7300 Raynaud's syndrome without gangrene: Secondary | ICD-10-CM | POA: Diagnosis not present

## 2016-09-16 DIAGNOSIS — M546 Pain in thoracic spine: Secondary | ICD-10-CM | POA: Diagnosis not present

## 2016-09-16 DIAGNOSIS — M9902 Segmental and somatic dysfunction of thoracic region: Secondary | ICD-10-CM | POA: Diagnosis not present

## 2016-09-16 DIAGNOSIS — M9903 Segmental and somatic dysfunction of lumbar region: Secondary | ICD-10-CM | POA: Diagnosis not present

## 2016-09-16 DIAGNOSIS — M5431 Sciatica, right side: Secondary | ICD-10-CM | POA: Diagnosis not present

## 2016-09-23 DIAGNOSIS — M9901 Segmental and somatic dysfunction of cervical region: Secondary | ICD-10-CM | POA: Diagnosis not present

## 2016-09-23 DIAGNOSIS — M542 Cervicalgia: Secondary | ICD-10-CM | POA: Diagnosis not present

## 2016-09-23 DIAGNOSIS — M5030 Other cervical disc degeneration, unspecified cervical region: Secondary | ICD-10-CM | POA: Diagnosis not present

## 2016-09-23 DIAGNOSIS — M47812 Spondylosis without myelopathy or radiculopathy, cervical region: Secondary | ICD-10-CM | POA: Diagnosis not present

## 2016-10-02 DIAGNOSIS — M542 Cervicalgia: Secondary | ICD-10-CM | POA: Diagnosis not present

## 2016-10-02 DIAGNOSIS — M5030 Other cervical disc degeneration, unspecified cervical region: Secondary | ICD-10-CM | POA: Diagnosis not present

## 2016-10-02 DIAGNOSIS — M47812 Spondylosis without myelopathy or radiculopathy, cervical region: Secondary | ICD-10-CM | POA: Diagnosis not present

## 2016-10-02 DIAGNOSIS — M9901 Segmental and somatic dysfunction of cervical region: Secondary | ICD-10-CM | POA: Diagnosis not present

## 2016-10-16 DIAGNOSIS — M9901 Segmental and somatic dysfunction of cervical region: Secondary | ICD-10-CM | POA: Diagnosis not present

## 2016-10-16 DIAGNOSIS — M542 Cervicalgia: Secondary | ICD-10-CM | POA: Diagnosis not present

## 2016-10-16 DIAGNOSIS — M5030 Other cervical disc degeneration, unspecified cervical region: Secondary | ICD-10-CM | POA: Diagnosis not present

## 2016-10-16 DIAGNOSIS — M47812 Spondylosis without myelopathy or radiculopathy, cervical region: Secondary | ICD-10-CM | POA: Diagnosis not present

## 2016-10-30 DIAGNOSIS — R69 Illness, unspecified: Secondary | ICD-10-CM | POA: Diagnosis not present

## 2016-10-31 DIAGNOSIS — R69 Illness, unspecified: Secondary | ICD-10-CM | POA: Diagnosis not present

## 2016-11-04 DIAGNOSIS — R69 Illness, unspecified: Secondary | ICD-10-CM | POA: Diagnosis not present

## 2016-11-06 DIAGNOSIS — M5030 Other cervical disc degeneration, unspecified cervical region: Secondary | ICD-10-CM | POA: Diagnosis not present

## 2016-11-06 DIAGNOSIS — M9901 Segmental and somatic dysfunction of cervical region: Secondary | ICD-10-CM | POA: Diagnosis not present

## 2016-11-06 DIAGNOSIS — M47812 Spondylosis without myelopathy or radiculopathy, cervical region: Secondary | ICD-10-CM | POA: Diagnosis not present

## 2016-11-06 DIAGNOSIS — M542 Cervicalgia: Secondary | ICD-10-CM | POA: Diagnosis not present

## 2016-11-27 DIAGNOSIS — M5030 Other cervical disc degeneration, unspecified cervical region: Secondary | ICD-10-CM | POA: Diagnosis not present

## 2016-11-27 DIAGNOSIS — M542 Cervicalgia: Secondary | ICD-10-CM | POA: Diagnosis not present

## 2016-11-27 DIAGNOSIS — M9901 Segmental and somatic dysfunction of cervical region: Secondary | ICD-10-CM | POA: Diagnosis not present

## 2016-11-27 DIAGNOSIS — M47812 Spondylosis without myelopathy or radiculopathy, cervical region: Secondary | ICD-10-CM | POA: Diagnosis not present

## 2016-12-04 DIAGNOSIS — R69 Illness, unspecified: Secondary | ICD-10-CM | POA: Diagnosis not present

## 2016-12-15 DIAGNOSIS — M797 Fibromyalgia: Secondary | ICD-10-CM | POA: Diagnosis not present

## 2016-12-15 DIAGNOSIS — E782 Mixed hyperlipidemia: Secondary | ICD-10-CM | POA: Diagnosis not present

## 2016-12-15 DIAGNOSIS — R7303 Prediabetes: Secondary | ICD-10-CM | POA: Diagnosis not present

## 2016-12-15 DIAGNOSIS — R69 Illness, unspecified: Secondary | ICD-10-CM | POA: Diagnosis not present

## 2016-12-15 DIAGNOSIS — I10 Essential (primary) hypertension: Secondary | ICD-10-CM | POA: Diagnosis not present

## 2016-12-15 DIAGNOSIS — E039 Hypothyroidism, unspecified: Secondary | ICD-10-CM | POA: Diagnosis not present

## 2016-12-15 DIAGNOSIS — J449 Chronic obstructive pulmonary disease, unspecified: Secondary | ICD-10-CM | POA: Diagnosis not present

## 2017-01-15 DIAGNOSIS — M47812 Spondylosis without myelopathy or radiculopathy, cervical region: Secondary | ICD-10-CM | POA: Diagnosis not present

## 2017-01-15 DIAGNOSIS — M5030 Other cervical disc degeneration, unspecified cervical region: Secondary | ICD-10-CM | POA: Diagnosis not present

## 2017-01-15 DIAGNOSIS — M542 Cervicalgia: Secondary | ICD-10-CM | POA: Diagnosis not present

## 2017-01-15 DIAGNOSIS — M9901 Segmental and somatic dysfunction of cervical region: Secondary | ICD-10-CM | POA: Diagnosis not present

## 2017-02-12 DIAGNOSIS — M9901 Segmental and somatic dysfunction of cervical region: Secondary | ICD-10-CM | POA: Diagnosis not present

## 2017-02-12 DIAGNOSIS — M542 Cervicalgia: Secondary | ICD-10-CM | POA: Diagnosis not present

## 2017-02-12 DIAGNOSIS — M5030 Other cervical disc degeneration, unspecified cervical region: Secondary | ICD-10-CM | POA: Diagnosis not present

## 2017-02-12 DIAGNOSIS — M47812 Spondylosis without myelopathy or radiculopathy, cervical region: Secondary | ICD-10-CM | POA: Diagnosis not present

## 2017-03-09 DIAGNOSIS — J449 Chronic obstructive pulmonary disease, unspecified: Secondary | ICD-10-CM | POA: Diagnosis not present

## 2017-03-09 DIAGNOSIS — E782 Mixed hyperlipidemia: Secondary | ICD-10-CM | POA: Diagnosis not present

## 2017-03-09 DIAGNOSIS — K219 Gastro-esophageal reflux disease without esophagitis: Secondary | ICD-10-CM | POA: Diagnosis not present

## 2017-03-09 DIAGNOSIS — R7309 Other abnormal glucose: Secondary | ICD-10-CM | POA: Diagnosis not present

## 2017-03-09 DIAGNOSIS — E039 Hypothyroidism, unspecified: Secondary | ICD-10-CM | POA: Diagnosis not present

## 2017-03-09 DIAGNOSIS — I10 Essential (primary) hypertension: Secondary | ICD-10-CM | POA: Diagnosis not present

## 2017-03-09 DIAGNOSIS — Z0000 Encounter for general adult medical examination without abnormal findings: Secondary | ICD-10-CM | POA: Diagnosis not present

## 2017-03-09 DIAGNOSIS — R69 Illness, unspecified: Secondary | ICD-10-CM | POA: Diagnosis not present

## 2017-03-09 DIAGNOSIS — M797 Fibromyalgia: Secondary | ICD-10-CM | POA: Diagnosis not present

## 2017-03-26 DIAGNOSIS — M5030 Other cervical disc degeneration, unspecified cervical region: Secondary | ICD-10-CM | POA: Diagnosis not present

## 2017-03-26 DIAGNOSIS — M9901 Segmental and somatic dysfunction of cervical region: Secondary | ICD-10-CM | POA: Diagnosis not present

## 2017-03-26 DIAGNOSIS — M542 Cervicalgia: Secondary | ICD-10-CM | POA: Diagnosis not present

## 2017-03-26 DIAGNOSIS — M47812 Spondylosis without myelopathy or radiculopathy, cervical region: Secondary | ICD-10-CM | POA: Diagnosis not present

## 2017-04-21 DIAGNOSIS — Z780 Asymptomatic menopausal state: Secondary | ICD-10-CM | POA: Diagnosis not present

## 2017-04-21 DIAGNOSIS — M8588 Other specified disorders of bone density and structure, other site: Secondary | ICD-10-CM | POA: Diagnosis not present

## 2017-05-12 DIAGNOSIS — N951 Menopausal and female climacteric states: Secondary | ICD-10-CM | POA: Diagnosis not present

## 2017-05-12 DIAGNOSIS — Z01411 Encounter for gynecological examination (general) (routine) with abnormal findings: Secondary | ICD-10-CM | POA: Diagnosis not present

## 2017-05-12 DIAGNOSIS — Z79890 Hormone replacement therapy (postmenopausal): Secondary | ICD-10-CM | POA: Diagnosis not present

## 2017-05-14 DIAGNOSIS — M9901 Segmental and somatic dysfunction of cervical region: Secondary | ICD-10-CM | POA: Diagnosis not present

## 2017-05-14 DIAGNOSIS — M5030 Other cervical disc degeneration, unspecified cervical region: Secondary | ICD-10-CM | POA: Diagnosis not present

## 2017-05-14 DIAGNOSIS — M542 Cervicalgia: Secondary | ICD-10-CM | POA: Diagnosis not present

## 2017-05-14 DIAGNOSIS — M47812 Spondylosis without myelopathy or radiculopathy, cervical region: Secondary | ICD-10-CM | POA: Diagnosis not present

## 2017-05-26 ENCOUNTER — Other Ambulatory Visit: Payer: Self-pay | Admitting: Internal Medicine

## 2017-05-26 DIAGNOSIS — M545 Low back pain: Principal | ICD-10-CM

## 2017-05-26 DIAGNOSIS — G8929 Other chronic pain: Secondary | ICD-10-CM

## 2017-05-29 DIAGNOSIS — H524 Presbyopia: Secondary | ICD-10-CM | POA: Diagnosis not present

## 2017-06-01 DIAGNOSIS — Z1231 Encounter for screening mammogram for malignant neoplasm of breast: Secondary | ICD-10-CM | POA: Diagnosis not present

## 2017-06-03 ENCOUNTER — Other Ambulatory Visit: Payer: Commercial Managed Care - HMO

## 2017-06-04 ENCOUNTER — Ambulatory Visit
Admission: RE | Admit: 2017-06-04 | Discharge: 2017-06-04 | Disposition: A | Discharge status: Home / Self Care | Payer: Medicare HMO | Source: Ambulatory Visit | Attending: Internal Medicine | Admitting: Internal Medicine

## 2017-06-04 DIAGNOSIS — G8929 Other chronic pain: Secondary | ICD-10-CM

## 2017-06-04 DIAGNOSIS — M545 Low back pain: Principal | ICD-10-CM

## 2017-06-04 DIAGNOSIS — M4316 Spondylolisthesis, lumbar region: Secondary | ICD-10-CM | POA: Diagnosis not present

## 2017-06-04 MED ORDER — METHYLPREDNISOLONE ACETATE 40 MG/ML INJ SUSP (RADIOLOG
120.0000 mg | Freq: Once | INTRAMUSCULAR | Status: AC
  Administered 2017-06-04: 120 mg via EPIDURAL

## 2017-06-04 MED ORDER — IOPAMIDOL (ISOVUE-M 200) INJECTION 41%
1.0000 mL | Freq: Once | INTRAMUSCULAR | Status: AC
  Administered 2017-06-04: 1 mL via EPIDURAL

## 2017-06-04 NOTE — Discharge Instructions (Signed)

## 2017-07-02 DIAGNOSIS — M9901 Segmental and somatic dysfunction of cervical region: Secondary | ICD-10-CM | POA: Diagnosis not present

## 2017-07-02 DIAGNOSIS — M9903 Segmental and somatic dysfunction of lumbar region: Secondary | ICD-10-CM | POA: Diagnosis not present

## 2017-07-02 DIAGNOSIS — M62830 Muscle spasm of back: Secondary | ICD-10-CM | POA: Diagnosis not present

## 2017-07-02 DIAGNOSIS — M545 Low back pain: Secondary | ICD-10-CM | POA: Diagnosis not present

## 2017-07-26 DIAGNOSIS — Z23 Encounter for immunization: Secondary | ICD-10-CM | POA: Diagnosis not present

## 2017-08-26 DIAGNOSIS — E782 Mixed hyperlipidemia: Secondary | ICD-10-CM | POA: Diagnosis not present

## 2017-08-26 DIAGNOSIS — R69 Illness, unspecified: Secondary | ICD-10-CM | POA: Diagnosis not present

## 2017-08-26 DIAGNOSIS — E039 Hypothyroidism, unspecified: Secondary | ICD-10-CM | POA: Diagnosis not present

## 2017-08-26 DIAGNOSIS — J449 Chronic obstructive pulmonary disease, unspecified: Secondary | ICD-10-CM | POA: Diagnosis not present

## 2017-08-26 DIAGNOSIS — I10 Essential (primary) hypertension: Secondary | ICD-10-CM | POA: Diagnosis not present

## 2017-08-26 DIAGNOSIS — M1990 Unspecified osteoarthritis, unspecified site: Secondary | ICD-10-CM | POA: Diagnosis not present

## 2017-09-15 ENCOUNTER — Other Ambulatory Visit: Payer: Self-pay | Admitting: Internal Medicine

## 2017-09-15 DIAGNOSIS — R7303 Prediabetes: Secondary | ICD-10-CM | POA: Diagnosis not present

## 2017-09-15 DIAGNOSIS — R51 Headache: Principal | ICD-10-CM

## 2017-09-15 DIAGNOSIS — I7300 Raynaud's syndrome without gangrene: Secondary | ICD-10-CM | POA: Diagnosis not present

## 2017-09-15 DIAGNOSIS — E039 Hypothyroidism, unspecified: Secondary | ICD-10-CM | POA: Diagnosis not present

## 2017-09-15 DIAGNOSIS — E782 Mixed hyperlipidemia: Secondary | ICD-10-CM | POA: Diagnosis not present

## 2017-09-15 DIAGNOSIS — K219 Gastro-esophageal reflux disease without esophagitis: Secondary | ICD-10-CM | POA: Diagnosis not present

## 2017-09-15 DIAGNOSIS — R519 Headache, unspecified: Secondary | ICD-10-CM

## 2017-09-15 DIAGNOSIS — I10 Essential (primary) hypertension: Secondary | ICD-10-CM | POA: Diagnosis not present

## 2017-09-15 DIAGNOSIS — R69 Illness, unspecified: Secondary | ICD-10-CM | POA: Diagnosis not present

## 2017-09-15 DIAGNOSIS — J449 Chronic obstructive pulmonary disease, unspecified: Secondary | ICD-10-CM | POA: Diagnosis not present

## 2017-09-29 ENCOUNTER — Ambulatory Visit
Admission: RE | Admit: 2017-09-29 | Discharge: 2017-09-29 | Disposition: A | Discharge status: Home / Self Care | Payer: Medicare HMO | Source: Ambulatory Visit | Attending: Internal Medicine | Admitting: Internal Medicine

## 2017-09-29 DIAGNOSIS — R51 Headache: Secondary | ICD-10-CM | POA: Diagnosis not present

## 2017-09-29 DIAGNOSIS — R519 Headache, unspecified: Secondary | ICD-10-CM

## 2017-10-06 DIAGNOSIS — M722 Plantar fascial fibromatosis: Secondary | ICD-10-CM | POA: Diagnosis not present

## 2017-10-06 DIAGNOSIS — M2012 Hallux valgus (acquired), left foot: Secondary | ICD-10-CM | POA: Diagnosis not present

## 2017-10-06 DIAGNOSIS — M2011 Hallux valgus (acquired), right foot: Secondary | ICD-10-CM | POA: Diagnosis not present

## 2017-10-06 DIAGNOSIS — M7752 Other enthesopathy of left foot: Secondary | ICD-10-CM | POA: Diagnosis not present

## 2017-10-06 DIAGNOSIS — M7751 Other enthesopathy of right foot: Secondary | ICD-10-CM | POA: Diagnosis not present

## 2017-10-12 DIAGNOSIS — R69 Illness, unspecified: Secondary | ICD-10-CM | POA: Diagnosis not present

## 2017-11-06 DIAGNOSIS — R69 Illness, unspecified: Secondary | ICD-10-CM | POA: Diagnosis not present

## 2018-02-18 DIAGNOSIS — R69 Illness, unspecified: Secondary | ICD-10-CM | POA: Diagnosis not present

## 2018-02-18 DIAGNOSIS — E039 Hypothyroidism, unspecified: Secondary | ICD-10-CM | POA: Diagnosis not present

## 2018-02-18 DIAGNOSIS — I10 Essential (primary) hypertension: Secondary | ICD-10-CM | POA: Diagnosis not present

## 2018-02-18 DIAGNOSIS — J449 Chronic obstructive pulmonary disease, unspecified: Secondary | ICD-10-CM | POA: Diagnosis not present

## 2018-02-18 DIAGNOSIS — M1990 Unspecified osteoarthritis, unspecified site: Secondary | ICD-10-CM | POA: Diagnosis not present

## 2018-02-18 DIAGNOSIS — E782 Mixed hyperlipidemia: Secondary | ICD-10-CM | POA: Diagnosis not present

## 2018-04-21 DIAGNOSIS — Z0000 Encounter for general adult medical examination without abnormal findings: Secondary | ICD-10-CM | POA: Diagnosis not present

## 2018-04-21 DIAGNOSIS — J449 Chronic obstructive pulmonary disease, unspecified: Secondary | ICD-10-CM | POA: Diagnosis not present

## 2018-04-21 DIAGNOSIS — I7300 Raynaud's syndrome without gangrene: Secondary | ICD-10-CM | POA: Diagnosis not present

## 2018-04-21 DIAGNOSIS — E039 Hypothyroidism, unspecified: Secondary | ICD-10-CM | POA: Diagnosis not present

## 2018-04-21 DIAGNOSIS — M5090 Cervical disc disorder, unspecified, unspecified cervical region: Secondary | ICD-10-CM | POA: Diagnosis not present

## 2018-04-21 DIAGNOSIS — Z1389 Encounter for screening for other disorder: Secondary | ICD-10-CM | POA: Diagnosis not present

## 2018-04-21 DIAGNOSIS — M797 Fibromyalgia: Secondary | ICD-10-CM | POA: Diagnosis not present

## 2018-04-21 DIAGNOSIS — M519 Unspecified thoracic, thoracolumbar and lumbosacral intervertebral disc disorder: Secondary | ICD-10-CM | POA: Diagnosis not present

## 2018-04-21 DIAGNOSIS — E782 Mixed hyperlipidemia: Secondary | ICD-10-CM | POA: Diagnosis not present

## 2018-04-21 DIAGNOSIS — R7303 Prediabetes: Secondary | ICD-10-CM | POA: Diagnosis not present

## 2018-04-21 DIAGNOSIS — R69 Illness, unspecified: Secondary | ICD-10-CM | POA: Diagnosis not present

## 2018-04-21 DIAGNOSIS — I10 Essential (primary) hypertension: Secondary | ICD-10-CM | POA: Diagnosis not present

## 2018-04-21 DIAGNOSIS — Z1159 Encounter for screening for other viral diseases: Secondary | ICD-10-CM | POA: Diagnosis not present

## 2018-05-04 DIAGNOSIS — H524 Presbyopia: Secondary | ICD-10-CM | POA: Diagnosis not present

## 2018-05-24 DIAGNOSIS — Z79890 Hormone replacement therapy (postmenopausal): Secondary | ICD-10-CM | POA: Diagnosis not present

## 2018-05-24 DIAGNOSIS — N951 Menopausal and female climacteric states: Secondary | ICD-10-CM | POA: Diagnosis not present

## 2018-05-31 DIAGNOSIS — J309 Allergic rhinitis, unspecified: Secondary | ICD-10-CM | POA: Diagnosis not present

## 2018-06-02 DIAGNOSIS — Z1231 Encounter for screening mammogram for malignant neoplasm of breast: Secondary | ICD-10-CM | POA: Diagnosis not present

## 2018-07-13 DIAGNOSIS — R69 Illness, unspecified: Secondary | ICD-10-CM | POA: Diagnosis not present

## 2018-08-24 ENCOUNTER — Other Ambulatory Visit: Payer: Self-pay | Admitting: Internal Medicine

## 2018-08-24 DIAGNOSIS — M48062 Spinal stenosis, lumbar region with neurogenic claudication: Secondary | ICD-10-CM

## 2018-09-02 ENCOUNTER — Other Ambulatory Visit: Payer: Medicare HMO

## 2018-09-02 ENCOUNTER — Ambulatory Visit
Admission: RE | Admit: 2018-09-02 | Discharge: 2018-09-02 | Disposition: A | Discharge status: Home / Self Care | Payer: Medicare HMO | Source: Ambulatory Visit | Attending: Internal Medicine | Admitting: Internal Medicine

## 2018-09-02 DIAGNOSIS — M545 Low back pain: Secondary | ICD-10-CM | POA: Diagnosis not present

## 2018-09-02 DIAGNOSIS — M48062 Spinal stenosis, lumbar region with neurogenic claudication: Secondary | ICD-10-CM

## 2018-09-02 MED ORDER — METHYLPREDNISOLONE ACETATE 40 MG/ML INJ SUSP (RADIOLOG
120.0000 mg | Freq: Once | INTRAMUSCULAR | Status: AC
  Administered 2018-09-02: 120 mg via EPIDURAL

## 2018-09-02 MED ORDER — IOPAMIDOL (ISOVUE-M 200) INJECTION 41%
1.0000 mL | Freq: Once | INTRAMUSCULAR | Status: AC
  Administered 2018-09-02: 1 mL via EPIDURAL

## 2018-09-02 NOTE — Discharge Instructions (Signed)

## 2018-10-13 DIAGNOSIS — J449 Chronic obstructive pulmonary disease, unspecified: Secondary | ICD-10-CM | POA: Diagnosis not present

## 2018-10-13 DIAGNOSIS — J309 Allergic rhinitis, unspecified: Secondary | ICD-10-CM | POA: Diagnosis not present

## 2018-10-13 DIAGNOSIS — R69 Illness, unspecified: Secondary | ICD-10-CM | POA: Diagnosis not present

## 2018-10-13 DIAGNOSIS — E039 Hypothyroidism, unspecified: Secondary | ICD-10-CM | POA: Diagnosis not present

## 2018-10-13 DIAGNOSIS — I7300 Raynaud's syndrome without gangrene: Secondary | ICD-10-CM | POA: Diagnosis not present

## 2018-10-13 DIAGNOSIS — K219 Gastro-esophageal reflux disease without esophagitis: Secondary | ICD-10-CM | POA: Diagnosis not present

## 2018-10-13 DIAGNOSIS — I10 Essential (primary) hypertension: Secondary | ICD-10-CM | POA: Diagnosis not present

## 2018-10-13 DIAGNOSIS — M519 Unspecified thoracic, thoracolumbar and lumbosacral intervertebral disc disorder: Secondary | ICD-10-CM | POA: Diagnosis not present

## 2018-10-13 DIAGNOSIS — R7303 Prediabetes: Secondary | ICD-10-CM | POA: Diagnosis not present

## 2018-10-18 DIAGNOSIS — H25013 Cortical age-related cataract, bilateral: Secondary | ICD-10-CM | POA: Diagnosis not present

## 2018-10-18 DIAGNOSIS — H2512 Age-related nuclear cataract, left eye: Secondary | ICD-10-CM | POA: Diagnosis not present

## 2018-10-18 DIAGNOSIS — H2513 Age-related nuclear cataract, bilateral: Secondary | ICD-10-CM | POA: Diagnosis not present

## 2018-10-18 DIAGNOSIS — H25043 Posterior subcapsular polar age-related cataract, bilateral: Secondary | ICD-10-CM | POA: Diagnosis not present

## 2018-11-04 DIAGNOSIS — H2512 Age-related nuclear cataract, left eye: Secondary | ICD-10-CM | POA: Diagnosis not present

## 2018-11-04 DIAGNOSIS — H2513 Age-related nuclear cataract, bilateral: Secondary | ICD-10-CM | POA: Diagnosis not present

## 2018-11-05 DIAGNOSIS — H25043 Posterior subcapsular polar age-related cataract, bilateral: Secondary | ICD-10-CM | POA: Diagnosis not present

## 2018-11-05 DIAGNOSIS — H2511 Age-related nuclear cataract, right eye: Secondary | ICD-10-CM | POA: Diagnosis not present

## 2018-11-05 DIAGNOSIS — H2512 Age-related nuclear cataract, left eye: Secondary | ICD-10-CM | POA: Diagnosis not present

## 2018-11-05 DIAGNOSIS — H2513 Age-related nuclear cataract, bilateral: Secondary | ICD-10-CM | POA: Diagnosis not present

## 2018-11-05 DIAGNOSIS — H25013 Cortical age-related cataract, bilateral: Secondary | ICD-10-CM | POA: Diagnosis not present

## 2018-11-25 DIAGNOSIS — H2511 Age-related nuclear cataract, right eye: Secondary | ICD-10-CM | POA: Diagnosis not present

## 2018-12-14 DIAGNOSIS — R69 Illness, unspecified: Secondary | ICD-10-CM | POA: Diagnosis not present

## 2019-02-01 DIAGNOSIS — H43393 Other vitreous opacities, bilateral: Secondary | ICD-10-CM | POA: Diagnosis not present

## 2019-02-07 DIAGNOSIS — Z0100 Encounter for examination of eyes and vision without abnormal findings: Secondary | ICD-10-CM | POA: Diagnosis not present

## 2019-02-16 MED FILL — LORazepam 0.5 MG TABS: 0.5 | 20 days supply | Qty: 60 | Fill #0

## 2019-05-12 DIAGNOSIS — M5090 Cervical disc disorder, unspecified, unspecified cervical region: Secondary | ICD-10-CM | POA: Diagnosis not present

## 2019-05-12 DIAGNOSIS — J449 Chronic obstructive pulmonary disease, unspecified: Secondary | ICD-10-CM | POA: Diagnosis not present

## 2019-05-12 DIAGNOSIS — E039 Hypothyroidism, unspecified: Secondary | ICD-10-CM | POA: Diagnosis not present

## 2019-05-12 DIAGNOSIS — E782 Mixed hyperlipidemia: Secondary | ICD-10-CM | POA: Diagnosis not present

## 2019-05-12 DIAGNOSIS — M797 Fibromyalgia: Secondary | ICD-10-CM | POA: Diagnosis not present

## 2019-05-12 DIAGNOSIS — I10 Essential (primary) hypertension: Secondary | ICD-10-CM | POA: Diagnosis not present

## 2019-05-12 DIAGNOSIS — R69 Illness, unspecified: Secondary | ICD-10-CM | POA: Diagnosis not present

## 2019-05-12 DIAGNOSIS — M519 Unspecified thoracic, thoracolumbar and lumbosacral intervertebral disc disorder: Secondary | ICD-10-CM | POA: Diagnosis not present

## 2019-05-12 DIAGNOSIS — R7303 Prediabetes: Secondary | ICD-10-CM | POA: Diagnosis not present

## 2019-06-08 DIAGNOSIS — Z1231 Encounter for screening mammogram for malignant neoplasm of breast: Secondary | ICD-10-CM | POA: Diagnosis not present

## 2019-07-02 DIAGNOSIS — R69 Illness, unspecified: Secondary | ICD-10-CM | POA: Diagnosis not present

## 2019-11-16 DIAGNOSIS — J449 Chronic obstructive pulmonary disease, unspecified: Secondary | ICD-10-CM | POA: Diagnosis not present

## 2019-11-16 DIAGNOSIS — M519 Unspecified thoracic, thoracolumbar and lumbosacral intervertebral disc disorder: Secondary | ICD-10-CM | POA: Diagnosis not present

## 2019-11-16 DIAGNOSIS — F325 Major depressive disorder, single episode, in full remission: Secondary | ICD-10-CM | POA: Diagnosis not present

## 2019-11-16 DIAGNOSIS — K219 Gastro-esophageal reflux disease without esophagitis: Secondary | ICD-10-CM | POA: Diagnosis not present

## 2019-11-16 DIAGNOSIS — L97529 Non-pressure chronic ulcer of other part of left foot with unspecified severity: Secondary | ICD-10-CM | POA: Diagnosis not present

## 2019-11-16 DIAGNOSIS — E782 Mixed hyperlipidemia: Secondary | ICD-10-CM | POA: Diagnosis not present

## 2019-11-16 DIAGNOSIS — R7303 Prediabetes: Secondary | ICD-10-CM | POA: Diagnosis not present

## 2019-11-16 DIAGNOSIS — M797 Fibromyalgia: Secondary | ICD-10-CM | POA: Diagnosis not present

## 2019-11-16 DIAGNOSIS — E039 Hypothyroidism, unspecified: Secondary | ICD-10-CM | POA: Diagnosis not present

## 2019-11-16 DIAGNOSIS — I7300 Raynaud's syndrome without gangrene: Secondary | ICD-10-CM | POA: Diagnosis not present

## 2019-11-16 DIAGNOSIS — I10 Essential (primary) hypertension: Secondary | ICD-10-CM | POA: Diagnosis not present

## 2019-11-16 DIAGNOSIS — F419 Anxiety disorder, unspecified: Secondary | ICD-10-CM | POA: Diagnosis not present

## 2019-11-18 ENCOUNTER — Telehealth: Payer: Self-pay

## 2019-11-18 NOTE — Telephone Encounter (Signed)
Patient called concerning a referral to Dr. Sharol Given.  Stated that it was urgent and that she needs an appointment soon.  Cb# [phone].  Please advise.  Thank you.

## 2019-11-24 ENCOUNTER — Ambulatory Visit (INDEPENDENT_AMBULATORY_CARE_PROVIDER_SITE_OTHER): Payer: PPO | Admitting: Orthopedic Surgery

## 2019-11-24 ENCOUNTER — Other Ambulatory Visit: Payer: Self-pay

## 2019-11-24 ENCOUNTER — Ambulatory Visit (INDEPENDENT_AMBULATORY_CARE_PROVIDER_SITE_OTHER): Payer: PPO

## 2019-11-24 ENCOUNTER — Encounter: Payer: Self-pay | Admitting: Orthopedic Surgery

## 2019-11-24 VITALS — Ht 62.0 in | Wt 125.0 lb

## 2019-11-24 DIAGNOSIS — M79672 Pain in left foot: Secondary | ICD-10-CM

## 2019-11-25 LAB — RHEUMATOID FACTOR: Rhuematoid fact SerPl-aCnc: <14 IU/mL (ref <14)

## 2019-11-25 LAB — HEMOGLOBIN A1C
Hgb A1c MFr Bld: 6 % of total Hgb — ABNORMAL HIGH (ref <5.7)
Mean Plasma Glucose: 126 (calc)
eAG (mmol/L): 7 (calc)

## 2019-11-25 LAB — URIC ACID: Uric Acid, Serum: 4.8 mg/dL (ref 2.5–7.0)

## 2019-11-25 LAB — SEDIMENTATION RATE: Sed Rate: 6 mm/h (ref 0–30)

## 2019-11-28 ENCOUNTER — Encounter: Payer: Self-pay | Admitting: Orthopedic Surgery

## 2019-11-28 NOTE — Progress Notes (Signed)
Office Visit Note   Patient: Stefanie Carter           Date of Birth: 23-Feb-1946           MRN: [id number] Visit Date: 11/24/2019              Requested by: No referring provider defined for this encounter. PCP: System, Pcp Not In  Chief Complaint  Patient presents with  . Left Foot - Pain, New Patient (Initial Visit)      HPI: Patient is a 74 year old woman who presents complaining of a painful ulcer beneath the left forefoot.  She states that this started back in December.  She states she is not diabetic she states she has had bleeding from the ulcer uses Band-Aids.  Assessment & Plan: Visit Diagnoses:  1. Left foot pain     Plan: Patient does have what appears to be a insensate neuropathic ulcer will check a hemoglobin A1c a uric acid and a rheumatology panel.  She will bring her shoes at follow-up.  Follow-Up Instructions: Return in about 2 weeks (around 12/08/2019).   Ortho Exam  Patient is alert, oriented, no adenopathy, well-dressed, normal affect, normal respiratory effort. Examination patient has Heberden's and Bouchard's nodes in her fingers consistent with osteoarthritis she is a former smoker she has a good dorsalis pedis and posterior tibial pulse.  She has a Wagner grade 1 ulcer beneath the third metatarsal head left foot.  Patient does complain of Raynaud's symptoms in both hands and feet she has dry cracked skin in her feet but no cellulitis her shoe was smaller than her foot.  Imaging: No results found. No images are attached to the encounter.  Labs: Lab Results  Component Value Date   HGBA1C 6.0 (H) 11/24/2019   ESRSEDRATE 6 11/24/2019   LABURIC 4.8 11/24/2019     Lab Results  Component Value Date   LABURIC 4.8 11/24/2019    No results found for: MG No results found for: VD25OH  No results found for: PREALBUMIN No flowsheet data found.   Body mass index is 22.86 kg/m.  Orders:  Orders Placed This Encounter  Procedures  . XR Foot  2 Views Left  . Rheumatoid Factor  . Uric acid  . HgB A1c  . Sed Rate (ESR)  . ANA   No orders of the defined types were placed in this encounter.    Procedures: No procedures performed  Clinical Data: No additional findings.  ROS:  All other systems negative, except as noted in the HPI. Review of Systems  Objective: Vital Signs: Ht '5\' 2"'$  (1.575 m)   Wt 125 lb (56.7 kg)   BMI 22.86 kg/m   Specialty Comments:  No specialty comments available.  PMFS History: Patient Active Problem List   Diagnosis Date Noted  . Spinal stenosis 06/20/2013  . HLD (hyperlipidemia) 06/20/2013  . BP (high blood pressure) 06/20/2013  . Anxiety 06/20/2013   Past Medical History:  Diagnosis Date  . Anxiety   . HLD (hyperlipidemia)   . HTN (hypertension)   . Hypothyroid   . Spinal stenosis     History reviewed. No pertinent family history.  Past Surgical History:  Procedure Laterality Date  . APPENDECTOMY    . SALIVARY GLAND SURGERY    . TONSILLECTOMY     Social History   Occupational History  . Not on file  Tobacco Use  . Smoking status: Former Smoker    Packs/day: 1.00    Years: 30.00  Pack years: 30.00    Types: Cigarettes    Quit date: 11/04/2011    Years since quitting: 8.0  . Smokeless tobacco: Never Used  Substance and Sexual Activity  . Alcohol use: Never  . Drug use: Never  . Sexual activity: Not on file

## 2019-11-29 LAB — ANA: ANA Titer 1: NEGATIVE

## 2019-12-08 ENCOUNTER — Other Ambulatory Visit: Payer: Self-pay

## 2019-12-08 ENCOUNTER — Encounter: Payer: Self-pay | Admitting: Orthopedic Surgery

## 2019-12-08 ENCOUNTER — Ambulatory Visit (INDEPENDENT_AMBULATORY_CARE_PROVIDER_SITE_OTHER): Payer: PPO | Admitting: Orthopedic Surgery

## 2019-12-08 VITALS — Ht 62.0 in | Wt 125.0 lb

## 2019-12-08 DIAGNOSIS — L97521 Non-pressure chronic ulcer of other part of left foot limited to breakdown of skin: Secondary | ICD-10-CM

## 2019-12-08 DIAGNOSIS — M79672 Pain in left foot: Secondary | ICD-10-CM

## 2019-12-09 ENCOUNTER — Encounter: Payer: Self-pay | Admitting: Orthopedic Surgery

## 2019-12-09 NOTE — Progress Notes (Signed)
Office Visit Note   Patient: Stefanie Carter           Date of Birth: 02/12/1946           MRN: [id number] Visit Date: 12/08/2019              Requested by: No referring provider defined for this encounter. PCP: System, Pcp Not In  Chief Complaint  Patient presents with  . Left Foot - Follow-up      HPI: Patient is a 74 year old woman who presents in follow-up for ulceration second metatarsal head left foot.  Patient's laboratory studies from her last appointment showed a hemoglobin A1c of 6.0 and the rheumatoid panel was negative.  Patient is sitting in examining room digging into the wound on the plantar aspect of her foot with her nails peeling away the skin.  Assessment & Plan: Visit Diagnoses:  1. Left foot pain   2. Non-pressure chronic ulcer of other part of left foot limited to breakdown of skin ([place])     Plan: Discussed with the patient the importance of keeping the wound clean should only touch it with soap and water and dry dressing changes.  Recommended Achilles stretching and this was demonstrated to her to unload the pressure on the metatarsal heads recommended she do this 5 times a day a minute at a time.  Patient states that she does have wall socks.  Recommend that she wear the wall socks she is currently barefoot with a fungal rash on her foot.  Patient states she is currently going to exercise classes and recommended minimizing the weightbearing on her feet.  Also discussed the importance of a stiff soled athletic shoe to unload pressure from the forefoot.  Patient's current shoes are soft pliable and overloading the metatarsal heads.  Follow-Up Instructions: Return in about 4 weeks (around 01/05/2020).   Ortho Exam  Patient is alert, oriented, no adenopathy, well-dressed, normal affect, normal respiratory effort. Examination patient has a good dorsalis pedis and posterior tibial pulse bilaterally.  She does have Achilles contracture bilaterally with  dorsiflexion only to neutral.  She has prominent metatarsal heads with the fat pad displaced distally.  Patient has a 30-pack-year history of tobacco use and there is venous stasis swelling in the legs without ulcers.  She does have arthritic changes of the PIP and DIP joints of both hands.  Her feet show no evidence of a paronychial infection but she does have what appears to be a fungal rash in her feet from wearing the sneakers barefoot she complains of burning across the forefoot at the end of the day and this is consistent with overloading the forefoot.  She has a small ulcer beneath the second metatarsal head left foot which is 2 mm in diameter 5 mm in length and 1 mm deep.  There is no exposed bone or tendon.  Radiographs shows arthritic change of the great toe MTP joint left foot and old fracture of the proximal phalanx left little toe and a long second metatarsal.  The long second metatarsal does correlate with the ulcer beneath the second metatarsal head.  Imaging: No results found. No images are attached to the encounter.  Labs: Lab Results  Component Value Date   HGBA1C 6.0 (H) 11/24/2019   ESRSEDRATE 6 11/24/2019   LABURIC 4.8 11/24/2019     Lab Results  Component Value Date   LABURIC 4.8 11/24/2019    No results found for: MG No results found for: [facility]  No results found for: PREALBUMIN No flowsheet data found.   Body mass index is 22.86 kg/m.  Orders:  No orders of the defined types were placed in this encounter.  No orders of the defined types were placed in this encounter.    Procedures: No procedures performed  Clinical Data: No additional findings.  ROS:  All other systems negative, except as noted in the HPI. Review of Systems  Objective: Vital Signs: Ht 5\' 2"  (1.575 m)   Wt 125 lb (56.7 kg)   BMI 22.86 kg/m   Specialty Comments:  No specialty comments available.  PMFS History: Patient Active Problem List   Diagnosis Date Noted  .  Spinal stenosis 06/20/2013  . HLD (hyperlipidemia) 06/20/2013  . BP (high blood pressure) 06/20/2013  . Anxiety 06/20/2013   Past Medical History:  Diagnosis Date  . Anxiety   . HLD (hyperlipidemia)   . HTN (hypertension)   . Hypothyroid   . Spinal stenosis     History reviewed. No pertinent family history.  Past Surgical History:  Procedure Laterality Date  . APPENDECTOMY    . SALIVARY GLAND SURGERY    . TONSILLECTOMY     Social History   Occupational History  . Not on file  Tobacco Use  . Smoking status: Former Smoker    Packs/day: 1.00    Years: 30.00    Pack years: 30.00    Types: Cigarettes    Quit date: 11/04/2011    Years since quitting: 8.1  . Smokeless tobacco: Never Used  Substance and Sexual Activity  . Alcohol use: Never  . Drug use: Never  . Sexual activity: Not on file

## 2019-12-12 DIAGNOSIS — E782 Mixed hyperlipidemia: Secondary | ICD-10-CM | POA: Diagnosis not present

## 2019-12-12 DIAGNOSIS — F325 Major depressive disorder, single episode, in full remission: Secondary | ICD-10-CM | POA: Diagnosis not present

## 2019-12-12 DIAGNOSIS — J449 Chronic obstructive pulmonary disease, unspecified: Secondary | ICD-10-CM | POA: Diagnosis not present

## 2019-12-12 DIAGNOSIS — M1990 Unspecified osteoarthritis, unspecified site: Secondary | ICD-10-CM | POA: Diagnosis not present

## 2019-12-12 DIAGNOSIS — E039 Hypothyroidism, unspecified: Secondary | ICD-10-CM | POA: Diagnosis not present

## 2019-12-12 DIAGNOSIS — I10 Essential (primary) hypertension: Secondary | ICD-10-CM | POA: Diagnosis not present

## 2019-12-29 ENCOUNTER — Telehealth: Payer: Self-pay | Admitting: Orthopedic Surgery

## 2019-12-29 NOTE — Telephone Encounter (Signed)
[facility] notes faxed [facility]/referring office [phone]

## 2020-01-05 ENCOUNTER — Ambulatory Visit: Payer: PPO | Admitting: Orthopedic Surgery

## 2020-01-12 ENCOUNTER — Other Ambulatory Visit: Payer: Self-pay | Admitting: Internal Medicine

## 2020-01-16 ENCOUNTER — Other Ambulatory Visit: Payer: Self-pay | Admitting: Internal Medicine

## 2020-01-16 DIAGNOSIS — M519 Unspecified thoracic, thoracolumbar and lumbosacral intervertebral disc disorder: Secondary | ICD-10-CM

## 2020-01-24 ENCOUNTER — Ambulatory Visit
Admission: RE | Admit: 2020-01-24 | Discharge: 2020-01-24 | Disposition: A | Discharge status: Home / Self Care | Payer: Medicare HMO | Source: Ambulatory Visit | Attending: Internal Medicine | Admitting: Internal Medicine

## 2020-01-24 ENCOUNTER — Other Ambulatory Visit: Payer: Self-pay

## 2020-01-24 DIAGNOSIS — M47816 Spondylosis without myelopathy or radiculopathy, lumbar region: Secondary | ICD-10-CM | POA: Diagnosis not present

## 2020-01-24 DIAGNOSIS — M519 Unspecified thoracic, thoracolumbar and lumbosacral intervertebral disc disorder: Secondary | ICD-10-CM

## 2020-01-24 MED ORDER — METHYLPREDNISOLONE ACETATE 40 MG/ML INJ SUSP (RADIOLOG
120.0000 mg | Freq: Once | INTRAMUSCULAR | Status: AC
  Administered 2020-01-24: 120 mg via EPIDURAL

## 2020-01-24 MED ORDER — IOPAMIDOL (ISOVUE-M 200) INJECTION 41%
1.0000 mL | Freq: Once | INTRAMUSCULAR | Status: AC
  Administered 2020-01-24: 1 mL via EPIDURAL

## 2020-01-24 NOTE — Discharge Instructions (Signed)

## 2020-02-23 DIAGNOSIS — F325 Major depressive disorder, single episode, in full remission: Secondary | ICD-10-CM | POA: Diagnosis not present

## 2020-02-23 DIAGNOSIS — E039 Hypothyroidism, unspecified: Secondary | ICD-10-CM | POA: Diagnosis not present

## 2020-02-23 DIAGNOSIS — I10 Essential (primary) hypertension: Secondary | ICD-10-CM | POA: Diagnosis not present

## 2020-02-23 DIAGNOSIS — J449 Chronic obstructive pulmonary disease, unspecified: Secondary | ICD-10-CM | POA: Diagnosis not present

## 2020-02-23 DIAGNOSIS — E782 Mixed hyperlipidemia: Secondary | ICD-10-CM | POA: Diagnosis not present

## 2020-02-23 DIAGNOSIS — M1990 Unspecified osteoarthritis, unspecified site: Secondary | ICD-10-CM | POA: Diagnosis not present

## 2020-03-22 DIAGNOSIS — E782 Mixed hyperlipidemia: Secondary | ICD-10-CM | POA: Diagnosis not present

## 2020-03-22 DIAGNOSIS — M1990 Unspecified osteoarthritis, unspecified site: Secondary | ICD-10-CM | POA: Diagnosis not present

## 2020-03-22 DIAGNOSIS — E039 Hypothyroidism, unspecified: Secondary | ICD-10-CM | POA: Diagnosis not present

## 2020-03-22 DIAGNOSIS — I10 Essential (primary) hypertension: Secondary | ICD-10-CM | POA: Diagnosis not present

## 2020-03-22 DIAGNOSIS — J449 Chronic obstructive pulmonary disease, unspecified: Secondary | ICD-10-CM | POA: Diagnosis not present

## 2020-03-22 DIAGNOSIS — F325 Major depressive disorder, single episode, in full remission: Secondary | ICD-10-CM | POA: Diagnosis not present

## 2020-05-16 DIAGNOSIS — R69 Illness, unspecified: Secondary | ICD-10-CM | POA: Diagnosis not present

## 2020-05-18 DIAGNOSIS — R000 Tachycardia, unspecified: Secondary | ICD-10-CM | POA: Diagnosis not present

## 2020-05-18 DIAGNOSIS — N951 Menopausal and female climacteric states: Secondary | ICD-10-CM | POA: Diagnosis not present

## 2020-05-18 DIAGNOSIS — M542 Cervicalgia: Secondary | ICD-10-CM | POA: Diagnosis not present

## 2020-05-18 DIAGNOSIS — Z79890 Hormone replacement therapy (postmenopausal): Secondary | ICD-10-CM | POA: Diagnosis not present

## 2020-05-21 DIAGNOSIS — I10 Essential (primary) hypertension: Secondary | ICD-10-CM | POA: Diagnosis not present

## 2020-05-21 DIAGNOSIS — E782 Mixed hyperlipidemia: Secondary | ICD-10-CM | POA: Diagnosis not present

## 2020-05-21 DIAGNOSIS — K219 Gastro-esophageal reflux disease without esophagitis: Secondary | ICD-10-CM | POA: Diagnosis not present

## 2020-05-21 DIAGNOSIS — R000 Tachycardia, unspecified: Secondary | ICD-10-CM | POA: Diagnosis not present

## 2020-05-21 DIAGNOSIS — E039 Hypothyroidism, unspecified: Secondary | ICD-10-CM | POA: Diagnosis not present

## 2020-05-21 DIAGNOSIS — K047 Periapical abscess without sinus: Secondary | ICD-10-CM | POA: Diagnosis not present

## 2020-05-21 DIAGNOSIS — F325 Major depressive disorder, single episode, in full remission: Secondary | ICD-10-CM | POA: Diagnosis not present

## 2020-05-25 DIAGNOSIS — F325 Major depressive disorder, single episode, in full remission: Secondary | ICD-10-CM | POA: Diagnosis not present

## 2020-05-25 DIAGNOSIS — J449 Chronic obstructive pulmonary disease, unspecified: Secondary | ICD-10-CM | POA: Diagnosis not present

## 2020-05-25 DIAGNOSIS — I10 Essential (primary) hypertension: Secondary | ICD-10-CM | POA: Diagnosis not present

## 2020-05-25 DIAGNOSIS — E782 Mixed hyperlipidemia: Secondary | ICD-10-CM | POA: Diagnosis not present

## 2020-05-25 DIAGNOSIS — E039 Hypothyroidism, unspecified: Secondary | ICD-10-CM | POA: Diagnosis not present

## 2020-05-25 DIAGNOSIS — M1990 Unspecified osteoarthritis, unspecified site: Secondary | ICD-10-CM | POA: Diagnosis not present

## 2020-06-06 DIAGNOSIS — E039 Hypothyroidism, unspecified: Secondary | ICD-10-CM | POA: Diagnosis not present

## 2020-06-06 DIAGNOSIS — K219 Gastro-esophageal reflux disease without esophagitis: Secondary | ICD-10-CM | POA: Diagnosis not present

## 2020-06-06 DIAGNOSIS — E782 Mixed hyperlipidemia: Secondary | ICD-10-CM | POA: Diagnosis not present

## 2020-06-06 DIAGNOSIS — Z0000 Encounter for general adult medical examination without abnormal findings: Secondary | ICD-10-CM | POA: Diagnosis not present

## 2020-06-06 DIAGNOSIS — I7300 Raynaud's syndrome without gangrene: Secondary | ICD-10-CM | POA: Diagnosis not present

## 2020-06-06 DIAGNOSIS — M519 Unspecified thoracic, thoracolumbar and lumbosacral intervertebral disc disorder: Secondary | ICD-10-CM | POA: Diagnosis not present

## 2020-06-06 DIAGNOSIS — R000 Tachycardia, unspecified: Secondary | ICD-10-CM | POA: Diagnosis not present

## 2020-06-06 DIAGNOSIS — M5090 Cervical disc disorder, unspecified, unspecified cervical region: Secondary | ICD-10-CM | POA: Diagnosis not present

## 2020-06-06 DIAGNOSIS — R7309 Other abnormal glucose: Secondary | ICD-10-CM | POA: Diagnosis not present

## 2020-06-06 DIAGNOSIS — I10 Essential (primary) hypertension: Secondary | ICD-10-CM | POA: Diagnosis not present

## 2020-06-06 DIAGNOSIS — Z1389 Encounter for screening for other disorder: Secondary | ICD-10-CM | POA: Diagnosis not present

## 2020-06-07 ENCOUNTER — Other Ambulatory Visit: Payer: Self-pay | Admitting: Internal Medicine

## 2020-06-07 DIAGNOSIS — M519 Unspecified thoracic, thoracolumbar and lumbosacral intervertebral disc disorder: Secondary | ICD-10-CM

## 2020-06-08 ENCOUNTER — Other Ambulatory Visit: Payer: Self-pay

## 2020-06-08 ENCOUNTER — Ambulatory Visit
Admission: RE | Admit: 2020-06-08 | Discharge: 2020-06-08 | Disposition: A | Discharge status: Home / Self Care | Payer: Medicare HMO | Source: Ambulatory Visit | Attending: Internal Medicine | Admitting: Internal Medicine

## 2020-06-08 DIAGNOSIS — M519 Unspecified thoracic, thoracolumbar and lumbosacral intervertebral disc disorder: Secondary | ICD-10-CM

## 2020-06-08 DIAGNOSIS — M545 Low back pain: Secondary | ICD-10-CM | POA: Diagnosis not present

## 2020-06-08 MED ORDER — IOPAMIDOL (ISOVUE-M 200) INJECTION 41%
1.0000 mL | Freq: Once | INTRAMUSCULAR | Status: AC
  Administered 2020-06-08: 1 mL via EPIDURAL

## 2020-06-08 MED ORDER — METHYLPREDNISOLONE ACETATE 40 MG/ML INJ SUSP (RADIOLOG
120.0000 mg | Freq: Once | INTRAMUSCULAR | Status: AC
  Administered 2020-06-08: 120 mg via EPIDURAL

## 2020-06-08 NOTE — Discharge Instructions (Signed)

## 2020-06-11 ENCOUNTER — Other Ambulatory Visit: Payer: Medicare HMO

## 2020-06-13 DIAGNOSIS — Z1231 Encounter for screening mammogram for malignant neoplasm of breast: Secondary | ICD-10-CM | POA: Diagnosis not present

## 2020-06-15 DIAGNOSIS — H18593 Other hereditary corneal dystrophies, bilateral: Secondary | ICD-10-CM | POA: Diagnosis not present

## 2020-06-15 DIAGNOSIS — H524 Presbyopia: Secondary | ICD-10-CM | POA: Diagnosis not present

## 2020-06-15 DIAGNOSIS — H353112 Nonexudative age-related macular degeneration, right eye, intermediate dry stage: Secondary | ICD-10-CM | POA: Diagnosis not present

## 2020-06-15 DIAGNOSIS — H0100A Unspecified blepharitis right eye, upper and lower eyelids: Secondary | ICD-10-CM | POA: Diagnosis not present

## 2020-06-26 DIAGNOSIS — F325 Major depressive disorder, single episode, in full remission: Secondary | ICD-10-CM | POA: Diagnosis not present

## 2020-06-26 DIAGNOSIS — E782 Mixed hyperlipidemia: Secondary | ICD-10-CM | POA: Diagnosis not present

## 2020-06-26 DIAGNOSIS — J449 Chronic obstructive pulmonary disease, unspecified: Secondary | ICD-10-CM | POA: Diagnosis not present

## 2020-06-26 DIAGNOSIS — E039 Hypothyroidism, unspecified: Secondary | ICD-10-CM | POA: Diagnosis not present

## 2020-06-26 DIAGNOSIS — M1990 Unspecified osteoarthritis, unspecified site: Secondary | ICD-10-CM | POA: Diagnosis not present

## 2020-06-26 DIAGNOSIS — I10 Essential (primary) hypertension: Secondary | ICD-10-CM | POA: Diagnosis not present

## 2020-07-10 ENCOUNTER — Other Ambulatory Visit (HOSPITAL_COMMUNITY): Payer: Self-pay | Admitting: Internal Medicine

## 2020-07-12 ENCOUNTER — Other Ambulatory Visit: Payer: Self-pay

## 2020-07-12 ENCOUNTER — Ambulatory Visit: Payer: Self-pay

## 2020-07-12 ENCOUNTER — Ambulatory Visit (INDEPENDENT_AMBULATORY_CARE_PROVIDER_SITE_OTHER): Payer: Medicare HMO | Admitting: Surgery

## 2020-07-12 ENCOUNTER — Encounter: Payer: Self-pay | Admitting: Surgery

## 2020-07-12 VITALS — BP 114/68 | HR 74 | Ht 62.0 in | Wt 125.0 lb

## 2020-07-12 DIAGNOSIS — M5442 Lumbago with sciatica, left side: Secondary | ICD-10-CM | POA: Diagnosis not present

## 2020-07-12 DIAGNOSIS — M5416 Radiculopathy, lumbar region: Secondary | ICD-10-CM | POA: Diagnosis not present

## 2020-07-12 DIAGNOSIS — M4316 Spondylolisthesis, lumbar region: Secondary | ICD-10-CM

## 2020-07-12 NOTE — Progress Notes (Signed)
Office Visit Note   Patient: Stefanie Carter           Date of Birth: 08-Sep-1946           MRN: [id number] Visit Date: 07/12/2020              Requested by: No referring provider defined for this encounter. PCP: Pcp, No   Assessment & Plan: Visit Diagnoses:  1. Acute midline low back pain with left-sided sciatica   2. Spondylolisthesis of lumbar region   3. Radiculopathy, lumbar region     Plan: With patient's complaint of worsening pain I recommended getting a lumbar MRI to rule out HNP/gnosis.  She has failed conservative treatment with multiple lumbar ESI's over the last several years.  She has developed some of her pain and I advised her that she should speak with her primary care physician about this since he has been managing her back issues for all this time.  Follow-up with Dr. Lorin Mercy to discuss results.  Follow-Up Instructions: Return in about 3 weeks (around 08/02/2020) for with dr yates to review lumbar mri scan.   Orders:  Orders Placed This Encounter  Procedures  . XR Lumbar Spine 2-3 Views  . MR Lumbar Spine w/o contrast   No orders of the defined types were placed in this encounter.     Procedures: No procedures performed   Clinical Data: No additional findings.   Subjective: Chief Complaint  Patient presents with  . Lower Back - Pain    HPI 74 year old female comes in today with complaints of worsening low back pain and bilateral lower extremity radiculopathy.  She has had problems with her low back for several years.  Since 2013 she has a documented history of multiple lumbar ESI's 2013, 2015, 2016, 2017, 2018, 2019 and most recently March and August 2021.  Patient states that these have been ordered by her primary care physician who has been managing this problem.  Patient was seen by Dr. Vertell Limber neurosurgeon in 2016.  No surgical intervention.  States that she hurts all the time and the pain never stops.  No complaints of bowel or bladder  incontinence.  Objective: Vital Signs: BP 114/68   Pulse 74   Ht 5\' 2"  (1.575 m)   Wt 125 lb (56.7 kg)   BMI 22.86 kg/m   Physical Exam Gait is antalgic.  When I attempted to examine patient and moved her right leg in attempt to do a straight leg raise  she screamed.  I was not able to continue my exam. Ortho Exam  Specialty Comments:  No specialty comments available.  Imaging: No results found.   PMFS History: Patient Active Problem List   Diagnosis Date Noted  . Spinal stenosis 06/20/2013  . HLD (hyperlipidemia) 06/20/2013  . BP (high blood pressure) 06/20/2013  . Anxiety 06/20/2013   Past Medical History:  Diagnosis Date  . Anxiety   . HLD (hyperlipidemia)   . HTN (hypertension)   . Hypothyroid   . Spinal stenosis     History reviewed. No pertinent family history.  Past Surgical History:  Procedure Laterality Date  . APPENDECTOMY    . SALIVARY GLAND SURGERY    . TONSILLECTOMY     Social History   Occupational History  . Not on file  Tobacco Use  . Smoking status: Former Smoker    Packs/day: 1.00    Years: 30.00    Pack years: 30.00    Types: Cigarettes    Quit  date: 11/04/2011    Years since quitting: 8.7  . Smokeless tobacco: Never Used  Substance and Sexual Activity  . Alcohol use: Never  . Drug use: Never  . Sexual activity: Not on file

## 2020-07-13 ENCOUNTER — Other Ambulatory Visit: Payer: Self-pay | Admitting: Surgery

## 2020-07-13 DIAGNOSIS — M4316 Spondylolisthesis, lumbar region: Secondary | ICD-10-CM

## 2020-07-17 ENCOUNTER — Encounter: Payer: Self-pay | Admitting: Surgery

## 2020-07-24 DIAGNOSIS — F325 Major depressive disorder, single episode, in full remission: Secondary | ICD-10-CM | POA: Diagnosis not present

## 2020-07-24 DIAGNOSIS — M1990 Unspecified osteoarthritis, unspecified site: Secondary | ICD-10-CM | POA: Diagnosis not present

## 2020-07-24 DIAGNOSIS — E782 Mixed hyperlipidemia: Secondary | ICD-10-CM | POA: Diagnosis not present

## 2020-07-24 DIAGNOSIS — I10 Essential (primary) hypertension: Secondary | ICD-10-CM | POA: Diagnosis not present

## 2020-07-24 DIAGNOSIS — J449 Chronic obstructive pulmonary disease, unspecified: Secondary | ICD-10-CM | POA: Diagnosis not present

## 2020-07-24 DIAGNOSIS — E039 Hypothyroidism, unspecified: Secondary | ICD-10-CM | POA: Diagnosis not present

## 2020-07-27 DIAGNOSIS — F325 Major depressive disorder, single episode, in full remission: Secondary | ICD-10-CM | POA: Diagnosis not present

## 2020-07-27 DIAGNOSIS — E782 Mixed hyperlipidemia: Secondary | ICD-10-CM | POA: Diagnosis not present

## 2020-07-27 DIAGNOSIS — M1990 Unspecified osteoarthritis, unspecified site: Secondary | ICD-10-CM | POA: Diagnosis not present

## 2020-07-27 DIAGNOSIS — E039 Hypothyroidism, unspecified: Secondary | ICD-10-CM | POA: Diagnosis not present

## 2020-07-27 DIAGNOSIS — I10 Essential (primary) hypertension: Secondary | ICD-10-CM | POA: Diagnosis not present

## 2020-07-27 DIAGNOSIS — J449 Chronic obstructive pulmonary disease, unspecified: Secondary | ICD-10-CM | POA: Diagnosis not present

## 2020-08-03 ENCOUNTER — Ambulatory Visit
Admission: RE | Admit: 2020-08-03 | Discharge: 2020-08-03 | Disposition: A | Discharge status: Home / Self Care | Payer: Medicare HMO | Source: Ambulatory Visit | Attending: Surgery | Admitting: Surgery

## 2020-08-03 ENCOUNTER — Other Ambulatory Visit: Payer: Self-pay

## 2020-08-03 DIAGNOSIS — M48061 Spinal stenosis, lumbar region without neurogenic claudication: Secondary | ICD-10-CM | POA: Diagnosis not present

## 2020-08-03 DIAGNOSIS — M4316 Spondylolisthesis, lumbar region: Secondary | ICD-10-CM

## 2020-08-07 ENCOUNTER — Ambulatory Visit: Payer: Medicare HMO | Admitting: Orthopaedic Surgery

## 2020-08-07 ENCOUNTER — Encounter: Payer: Self-pay | Admitting: Orthopaedic Surgery

## 2020-08-07 VITALS — Ht 61.5 in | Wt 115.0 lb

## 2020-08-07 DIAGNOSIS — M48062 Spinal stenosis, lumbar region with neurogenic claudication: Secondary | ICD-10-CM | POA: Diagnosis not present

## 2020-08-14 NOTE — Progress Notes (Addendum)
Office Visit Note   Patient: Stefanie Carter           Date of Birth: 02-23-1946           MRN: [id number] Visit Date: 08/07/2020              Requested by: No referring provider defined for this encounter. PCP: Pcp, No   Assessment & Plan: Visit Diagnoses:  1. Spinal stenosis of lumbar region with neurogenic claudication     Plan: Patient has severe spinal stenosis at L4-5. Although she has anterolisthesis is not shifting on flexion-extension images. Her stenosis has progressed since 2011 and is now severe stenosis. Plan would be single level decompression she understands the risk of progression or further shifting she might require instrumented fusion later but currently is not having significant shifting and should get relief of her neurogenic claudication symptoms with simple decompression alone. Procedure discussed she can continue Tylenol and ibuprofen as she is doing. Plan would be overnight stay. We discussed potential for progression of possible need for later fusion. Questions were elicited and answered she requests we proceed. Follow-Up Instructions: No follow-ups on file.   Orders:  No orders of the defined types were placed in this encounter.  No orders of the defined types were placed in this encounter.     Procedures: No procedures performed   Clinical Data: No additional findings.   Subjective: Chief Complaint  Patient presents with  . Lower Back - Pain    MRI review    HPI 74 year old female returns with ongoing problems with back pain and lower extremity radicular symptoms. She has had epidurals from 2013-2019 at least one a year most recently in August 2021. She has been seen by Dr. Vertell Limber in 2016. She is not had any surgery. Patient has some spinal stenosis and MRI scan is available for review. No fever chills no bowel or bladder symptoms. X-rays showed collapse at L5-S1 without listhesis and grade 1-1.5 anterolisthesis L4-5 which did not change with  flexion-extension images. She states that time she can only walk 20 feet , increased pain with standing.. She states if she walks further her legs gives way and she can fall. Previous flexion-extension images show no change in anterolithesis.   MRI shows some sacralization transitional vertebrae lumbar sacral. Severe multifactorial stenosis at L4-5 slightly worse since 2011 with facet arthropathy 6 mm anterolisthesis. Bilateral foraminal stenosis. Mild narrowing L2-3 and L3-4.  Review of Systems All other systems are noncontributory. She has had problems with hyperlipidemia blood pressure problems and chronic pressure ulcer on her foot managed by Dr. Sharol Given in the past.  Objective: Vital Signs: Ht 5' 1.5" (1.562 m)   Wt 115 lb (52.2 kg)   BMI 21.38 kg/m   Physical Exam Constitutional:      Appearance: She is well-developed.  HENT:     Head: Normocephalic.     Right Ear: External ear normal.     Left Ear: External ear normal.  Eyes:     Pupils: Pupils are equal, round, and reactive to light.  Neck:     Thyroid: No thyromegaly.     Trachea: No tracheal deviation.  Cardiovascular:     Rate and Rhythm: Normal rate.  Pulmonary:     Effort: Pulmonary effort is normal.  Abdominal:     Palpations: Abdomen is soft.  Skin:    General: Skin is warm and dry.  Neurological:     Mental Status: She is alert and oriented to  person, place, and time.  Psychiatric:        Behavior: Behavior normal.     Ortho Exam patient has pain with straight leg raising on the right side at 60 degrees less severe on the left. Distal pulses palpable. Specialty Comments:  No specialty comments available.  Imaging: CLINICAL DATA:  Low back pain. Spondylolisthesis. Symptoms worsening over the last several years. Numbness and weakness in the left leg and foot.  EXAM: MRI LUMBAR SPINE WITHOUT CONTRAST  TECHNIQUE: Multiplanar, multisequence MR imaging of the lumbar spine was performed. No intravenous  contrast was administered.  COMPARISON:  MRI 06/14/2010.  Radiography 07/12/2020.  FINDINGS: Segmentation: 5 lumbar type vertebral bodies numbered as previous, with L5 being sacralized and transitional.  Alignment: 6 mm anterolisthesis L4-5. This is similar to the study of 2011.  Vertebrae:  No fracture or primary bone lesion.  Conus medullaris and cauda equina: Conus extends to the L1-2 level. Conus and cauda equina appear normal.  Paraspinal and other soft tissues: Negative  Disc levels:  Disc bulges at every level from T8-9 through L1-2. No apparent compressive stenosis of the canal or foramina however.  L2-3: Bulging of the disc. Facet and ligamentous hypertrophy. Mild multifactorial stenosis but without focal neural compression.  L3-4: Mild bulging of the disc. Mild facet and ligamentous prominence. Mild narrowing of the lateral recesses and foramina but without definite neural compression.  L4-5: Chronic facet arthropathy with 6 mm of anterolisthesis. Pseudo disc herniation. Severe stenosis of the canal at and just below the disc level. Neural compression quite likely at this level. Bilateral foraminal stenosis could compress either or both exiting L4 nerves. This stenosis has probably worsened slightly compared the study of 2011.  L5-S1: Transitional level.  No canal or foraminal stenosis.  IMPRESSION: 1. L5 is a sacralized and transitional vertebra. 2. Severe multifactorial spinal stenosis at the L4-5 level, slightly worsened compared to the study of 2011. Chronic facet arthropathy with 6 mm of anterolisthesis. Pseudo disc herniation. Bilateral foraminal stenosis could compress either or both exiting L4 nerves. 3. Mild multifactorial stenosis at L2-3 and L3-4 but without distinct neural compression.   Electronically Signed   By: Nelson Chimes M.D.   On: 08/04/2020 12:47    PMFS History: Patient Active Problem List   Diagnosis Date Noted    . Spinal stenosis 06/20/2013  . HLD (hyperlipidemia) 06/20/2013  . BP (high blood pressure) 06/20/2013  . Anxiety 06/20/2013   Past Medical History:  Diagnosis Date  . Anxiety   . HLD (hyperlipidemia)   . HTN (hypertension)   . Hypothyroid   . Spinal stenosis     No family history on file.  Past Surgical History:  Procedure Laterality Date  . APPENDECTOMY    . SALIVARY GLAND SURGERY    . TONSILLECTOMY     Social History   Occupational History  . Not on file  Tobacco Use  . Smoking status: Former Smoker    Packs/day: 1.00    Years: 30.00    Pack years: 30.00    Types: Cigarettes    Quit date: 11/04/2011    Years since quitting: 8.7  . Smokeless tobacco: Never Used  Substance and Sexual Activity  . Alcohol use: Never  . Drug use: Never  . Sexual activity: Not on file

## 2020-08-16 DIAGNOSIS — G834 Cauda equina syndrome: Secondary | ICD-10-CM | POA: Diagnosis not present

## 2020-08-16 DIAGNOSIS — M48062 Spinal stenosis, lumbar region with neurogenic claudication: Secondary | ICD-10-CM | POA: Diagnosis not present

## 2020-08-16 DIAGNOSIS — Z6822 Body mass index (BMI) 22.0-22.9, adult: Secondary | ICD-10-CM | POA: Diagnosis not present

## 2020-08-17 DIAGNOSIS — M4316 Spondylolisthesis, lumbar region: Secondary | ICD-10-CM | POA: Diagnosis not present

## 2020-08-17 DIAGNOSIS — M48062 Spinal stenosis, lumbar region with neurogenic claudication: Secondary | ICD-10-CM | POA: Diagnosis not present

## 2020-08-17 DIAGNOSIS — G834 Cauda equina syndrome: Secondary | ICD-10-CM | POA: Diagnosis not present

## 2020-08-23 ENCOUNTER — Telehealth: Payer: Self-pay | Admitting: Orthopaedic Surgery

## 2020-08-23 DIAGNOSIS — M48062 Spinal stenosis, lumbar region with neurogenic claudication: Secondary | ICD-10-CM

## 2020-08-23 NOTE — Telephone Encounter (Signed)
Patient called asked if she can be referred over to Dr. Ernestina Patches for RFA. The number to contact patient is [phone]

## 2020-08-23 NOTE — Telephone Encounter (Signed)
Referral placed.

## 2020-08-23 NOTE — Telephone Encounter (Signed)
[place] referral to [facility] to discuss. She has stenosis with claudication but is wanting to find something besides surgery to get better.

## 2020-08-23 NOTE — Telephone Encounter (Signed)
Please advise 

## 2020-09-04 ENCOUNTER — Encounter: Payer: Self-pay | Admitting: Physical Medicine and Rehabilitation

## 2020-09-04 ENCOUNTER — Ambulatory Visit (INDEPENDENT_AMBULATORY_CARE_PROVIDER_SITE_OTHER): Payer: Medicare HMO | Admitting: Physical Medicine and Rehabilitation

## 2020-09-04 ENCOUNTER — Telehealth: Payer: Self-pay | Admitting: Physical Medicine and Rehabilitation

## 2020-09-04 ENCOUNTER — Other Ambulatory Visit: Payer: Self-pay

## 2020-09-04 VITALS — BP 121/74 | HR 73

## 2020-09-04 DIAGNOSIS — M4316 Spondylolisthesis, lumbar region: Secondary | ICD-10-CM

## 2020-09-04 DIAGNOSIS — M5416 Radiculopathy, lumbar region: Secondary | ICD-10-CM | POA: Diagnosis not present

## 2020-09-04 DIAGNOSIS — M48062 Spinal stenosis, lumbar region with neurogenic claudication: Secondary | ICD-10-CM

## 2020-09-04 NOTE — Telephone Encounter (Signed)
Need auth for bilateral L4 TF. Scheduled for 11/11.

## 2020-09-04 NOTE — Progress Notes (Signed)
Bilateral low back and buttock pain with left posterior leg pain. Worst pain is in left posterior thigh. Cannot stand up straight. Numeric Pain Rating Scale and Functional Assessment Average Pain 10 Pain Right Now 9 My pain is constant, sharp, dull and aching Pain is worse with: walking, sitting and standing Pain improves with: rest   In the last MONTH (on 0-10 scale) has pain interfered with the following?  1. General activity like being  able to carry out your everyday physical activities such as walking, climbing stairs, carrying groceries, or moving a chair?  Rating(8)  2. Relation with others like being able to carry out your usual social activities and roles such as  activities at home, at work and in your community. Rating(8)  3. Enjoyment of life such that you have  been bothered by emotional problems such as feeling anxious, depressed or irritable?  Rating(8)

## 2020-09-04 NOTE — Telephone Encounter (Signed)
Pt was approve  #[id number]

## 2020-09-05 ENCOUNTER — Encounter: Payer: Self-pay | Admitting: Physical Medicine and Rehabilitation

## 2020-09-05 NOTE — Progress Notes (Signed)
Stefanie Carter - 74 y.o. female MRN [id number]  Date of birth: 1946-05-02  Office Visit Note: Visit Date: 09/04/2020 PCP: Pcp, No Referred by: Marybelle Killings, MD  Subjective: Chief Complaint  Patient presents with  . Lower Back - Pain  . Left Leg - Pain   HPI: Stefanie Carter is a 74 y.o. female who comes in today At the request of Dr. Rodell Perna for evaluation management of chronic worsening severe low back pain with left more than right radicular leg pain.  Patient represents a very difficult case with MRI findings and x-ray findings of grade 1 listhesis of L4 on L5 with facet arthropathy and multifactorial severe stenosis at that level.  She reports left more than right radicular leg pain with catching sensation particularly with standing and moving she will get electric type shock into the hip and leg.  She has had this for quite some time.  She rates her pain as a 10 out of 10 constant sharp and dull and aching worse with walking but prolonged sitting can do it as well.  Going from sit to stand is problematic.  She does ambulate with a cane.  Most of her pain is posterior buttock and posterior lateral leg more of an L5 and S1 distribution.  I did review Dr. Lorin Mercy notes.  He did suggest 1 level fusion at L4-5.  She is not reported specific red flag complaints but today she tells me she is getting some what she called incontinence but really she talks about bowel constipation but sometimes diarrhea.  She does report some paresthesias in the pelvic region.  She has not noted focal weakness or foot drop.  She has not noted any urinary incontinence at least any difference than what would be noted is probably stress incontinence.  She is seeing a gastroenterologist for her bowel issues.  She reports that she does not want to have surgery because she cannot be "laid up "for any length of time.  Prior surgeon at the [facility] told her it would be 90-day hospital stay.  I believe  this was Dr. Sherlyn Lick.  I did explain to her that at least the surgery talked about by Dr. Lorin Mercy probably would not be a hospital stay in that regard although depending on social's structure patient could spend some time in skilled nursing facility if they did not have anybody at home which is her issue.  Nonetheless the patient really wants alternative treatment if possible.  She has had multiple epidural injections and all of these have been done from an interlaminar approach at [facility].  These have been above the level of stenosis and at least one caudal approach.  She reports after the caudal approach she still feels a sensation in the caudal area from that injection.  I am not sure if she received any injections at the [facility].  Her case is complicated by generalized anxiety disorder.  Review of Systems  Musculoskeletal: Positive for back pain and joint pain.       Left radicular leg pain  All other systems reviewed and are negative.  Otherwise per HPI.  Assessment & Plan: Visit Diagnoses:  1. Lumbar radiculopathy   2. Spinal stenosis of lumbar region with neurogenic claudication   3. Spondylolisthesis of lumbar region     Plan: Findings:  Chronic worsening severe recalcitrant low back pain and left radicular pain with mechanical type catching and sharp electrical sensation at times really  limiting her daily activities and affecting her life.  I really do think she is a surgical candidate for 1 level fusion as indicated by Dr. Lorin Mercy.  She is getting some symptoms that are questionable for cauda equina type symptoms but exam is pretty benign at this point.  She is following up with gastroenterology for her bowel issues.  She has had no retention of urine and no specific incontinence although she sounds like she has some stress incontinence.  If this worsens she should talk to Dr. Rodell Perna if it worsens or if it is urinary retention she should seek care at the  emergency department.  Obviously could have this looked at by urology.  Nonetheless I think the next approach at least 1 time is to give her a chance at a transforaminal injection on the left at L4 and L5 and just see if getting medication along those nerve roots in the area that is very compressed would least give her some relief.  Obviously if the patient was not having neurogenic changes and it was just a pain issue then we could manage this if the injection did seem to help.  She might benefit from pain management and that referral could be made as well.  She has tried all manner of other conservative care.  Interestingly, she specifically asked me about radiofrequency ablation but unfortunately she would not be a candidate for that as it would not help this left radicular type leg pain that she is having.  If that was corrected surgically or miraculously the injection from an epidural standpoint did help then maybe she would be a candidate for ablation for her back pain.  I briefly discussed spinal cord stimulator but at this point I would not want to do that considering the high level of stenosis that she had at L4-5.    Meds & Orders: No orders of the defined types were placed in this encounter.  No orders of the defined types were placed in this encounter.   Follow-up: Return for Left L4 and L5 transforaminal dural steroid injection.   Procedures: No procedures performed  No notes on file   Clinical History: No specialty comments available.   She reports that she quit smoking about 8 years ago. Her smoking use included cigarettes. She has a 30.00 pack-year smoking history. She has never used smokeless tobacco.  Recent Labs    11/24/19 1634  HGBA1C 6.0*  LABURIC 4.8    Objective:  VS:  HT:    WT:   BMI:     BP:121/74  HR:73bpm  TEMP: ( )  RESP:  Physical Exam Constitutional:      General: She is not in acute distress.    Appearance: Normal appearance. She is not  ill-appearing.  HENT:     Head: Normocephalic and atraumatic.     Right Ear: External ear normal.     Left Ear: External ear normal.  Eyes:     Extraocular Movements: Extraocular movements intact.  Cardiovascular:     Rate and Rhythm: Normal rate.     Pulses: Normal pulses.  Abdominal:     General: Abdomen is flat. There is no distension.     Palpations: Abdomen is soft.  Musculoskeletal:     Right lower leg: No edema.     Left lower leg: No edema.     Comments: Patient has good distal strength with no pain over the greater trochanters.  No clonus or focal weakness.  Skin:  Findings: No erythema, lesion or rash.  Neurological:     General: No focal deficit present.     Mental Status: She is alert and oriented to person, place, and time.     Sensory: No sensory deficit.     Motor: No weakness or abnormal muscle tone.     Coordination: Coordination normal.     Gait: Gait abnormal.  Psychiatric:        Mood and Affect: Mood normal.        Behavior: Behavior normal.     Ortho Exam  Imaging: No results found.  Past Medical/Family/Surgical/Social History: Medications & Allergies reviewed per EMR, new medications updated. Patient Active Problem List   Diagnosis Date Noted  . Spinal stenosis 06/20/2013  . HLD (hyperlipidemia) 06/20/2013  . BP (high blood pressure) 06/20/2013  . Anxiety 06/20/2013   Past Medical History:  Diagnosis Date  . Anxiety   . HLD (hyperlipidemia)   . HTN (hypertension)   . Hypothyroid   . Spinal stenosis    History reviewed. No pertinent family history. Past Surgical History:  Procedure Laterality Date  . APPENDECTOMY    . SALIVARY GLAND SURGERY    . TONSILLECTOMY     Social History   Occupational History  . Not on file  Tobacco Use  . Smoking status: Former Smoker    Packs/day: 1.00    Years: 30.00    Pack years: 30.00    Types: Cigarettes    Quit date: 11/04/2011    Years since quitting: 8.8  . Smokeless tobacco: Never Used    Substance and Sexual Activity  . Alcohol use: Never  . Drug use: Never  . Sexual activity: Not on file

## 2020-09-06 ENCOUNTER — Other Ambulatory Visit: Payer: Self-pay

## 2020-09-06 ENCOUNTER — Ambulatory Visit (INDEPENDENT_AMBULATORY_CARE_PROVIDER_SITE_OTHER): Payer: Medicare HMO | Admitting: Physical Medicine and Rehabilitation

## 2020-09-06 ENCOUNTER — Encounter: Payer: Self-pay | Admitting: Physical Medicine and Rehabilitation

## 2020-09-06 ENCOUNTER — Ambulatory Visit: Payer: Self-pay

## 2020-09-06 VITALS — BP 118/65 | HR 70

## 2020-09-06 DIAGNOSIS — M5416 Radiculopathy, lumbar region: Secondary | ICD-10-CM

## 2020-09-06 DIAGNOSIS — M48062 Spinal stenosis, lumbar region with neurogenic claudication: Secondary | ICD-10-CM | POA: Diagnosis not present

## 2020-09-06 MED ORDER — METHYLPREDNISOLONE ACETATE 80 MG/ML IJ SUSP
80.0000 mg | Freq: Once | INTRAMUSCULAR | Status: AC
  Administered 2020-09-06: 80 mg

## 2020-09-06 NOTE — Progress Notes (Signed)
Pt state lower back pain that travels down both legs. Pt state trying to get up from a chair and standing for along time makes the pain worse. Pt state she takes pain meds and use heating pads to ease the pain.  Numeric Pain Rating Scale and Functional Assessment Average Pain 8   In the last MONTH (on 0-10 scale) has pain interfered with the following?  1. General activity like being  able to carry out your everyday physical activities such as walking, climbing stairs, carrying groceries, or moving a chair?  Rating(8)   +Driver, -BT, -Dye Allergies.

## 2020-09-27 DIAGNOSIS — K5900 Constipation, unspecified: Secondary | ICD-10-CM | POA: Diagnosis not present

## 2020-09-27 DIAGNOSIS — N819 Female genital prolapse, unspecified: Secondary | ICD-10-CM | POA: Diagnosis not present

## 2020-10-04 DIAGNOSIS — N39498 Other specified urinary incontinence: Secondary | ICD-10-CM | POA: Diagnosis not present

## 2020-10-04 DIAGNOSIS — N814 Uterovaginal prolapse, unspecified: Secondary | ICD-10-CM | POA: Diagnosis not present

## 2020-10-22 DIAGNOSIS — N814 Uterovaginal prolapse, unspecified: Secondary | ICD-10-CM | POA: Diagnosis not present

## 2020-10-22 DIAGNOSIS — N951 Menopausal and female climacteric states: Secondary | ICD-10-CM | POA: Diagnosis not present

## 2020-10-22 DIAGNOSIS — N39498 Other specified urinary incontinence: Secondary | ICD-10-CM | POA: Diagnosis not present

## 2020-10-22 DIAGNOSIS — Z4689 Encounter for fitting and adjustment of other specified devices: Secondary | ICD-10-CM | POA: Diagnosis not present

## 2020-10-29 NOTE — Progress Notes (Signed)
Stefanie Carter - 75 y.o. female MRN [id number]  Date of birth: 24-Jan-1946  Office Visit Note: Visit Date: 09/06/2020 PCP: Pcp, No Referred by: No ref. provider found  Subjective: Chief Complaint  Patient presents with  . Lower Back - Pain  . Left Leg - Pain  . Right Leg - Pain   HPI:  Stefanie Carter is a 75 y.o. female who comes in today  for planned Bilateral L4-L5 Lumbar epidural steroid injection with fluoroscopic guidance.  The patient has failed conservative care including home exercise, medications, time and activity modification.  This injection will be diagnostic and hopefully therapeutic.  Please see requesting physician notes for further details and justification.  Referring provider: Dr. Annell Greening  Has also seen Dr. Letta Kocher at [facility].   ROS Otherwise per HPI.  Assessment & Plan: Visit Diagnoses:    ICD-10-CM   1. Spinal stenosis of lumbar region with neurogenic claudication  M48.062 XR C-ARM NO REPORT    Epidural Steroid injection    methylPREDNISolone acetate (DEPO-MEDROL) injection 80 mg  2. Lumbar radiculopathy  M54.16 XR C-ARM NO REPORT    Epidural Steroid injection    methylPREDNISolone acetate (DEPO-MEDROL) injection 80 mg    Plan: No additional findings.   Meds & Orders:  Meds ordered this encounter  Medications  . methylPREDNISolone acetate (DEPO-MEDROL) injection 80 mg    Orders Placed This Encounter  Procedures  . XR C-ARM NO REPORT  . Epidural Steroid injection    Follow-up: Return if symptoms worsen or fail to improve.   Procedures: No procedures performed  Lumbosacral Transforaminal Epidural Steroid Injection - Sub-Pedicular Approach with Fluoroscopic Guidance  Patient: Stefanie Carter      Date of Birth: July 28, 1946 MRN: [id number] PCP: Pcp, No      Visit Date: 09/06/2020   Universal Protocol:    Date/Time: 09/06/2020  Consent Given By: the patient  Position: PRONE  Additional  Comments: Vital signs were monitored before and after the procedure. Patient was prepped and draped in the usual sterile fashion. The correct patient, procedure, and site was verified.   Injection Procedure Details:   Procedure diagnoses: Spinal stenosis of lumbar region with neurogenic claudication [M48.062]    Meds Administered:  Meds ordered this encounter  Medications  . methylPREDNISolone acetate (DEPO-MEDROL) injection 80 mg    Laterality: Bilateral  Location/Site:  L4-L5  Needle:5.0 in., 22 ga.  Short bevel or Quincke spinal needle  Needle Placement: Transforaminal  Findings:    -Comments: Excellent flow of contrast along the nerve, nerve root and into the epidural space.  Procedure Details: After squaring off the end-plates to get a true AP view, the C-arm was positioned so that an oblique view of the foramen as noted above was visualized. The target area is just inferior to the "nose of the scotty dog" or sub pedicular. The soft tissues overlying this structure were infiltrated with 2-3 ml. of 1% Lidocaine without Epinephrine.  The spinal needle was inserted toward the target using a "trajectory" view along the fluoroscope beam.  Under AP and lateral visualization, the needle was advanced so it did not puncture dura and was located close the 6 O'Clock position of the pedical in AP tracterory. Biplanar projections were used to confirm position. Aspiration was confirmed to be negative for CSF and/or blood. A 1-2 ml. volume of Isovue-250 was injected and flow of contrast was noted at each level. Radiographs were obtained for documentation purposes.   After attaining the desired  flow of contrast documented above, a 0.5 to 1.0 ml test dose of 0.25% Marcaine was injected into each respective transforaminal space.  The patient was observed for 90 seconds post injection.  After no sensory deficits were reported, and normal lower extremity motor function was noted,   the above  injectate was administered so that equal amounts of the injectate were placed at each foramen (level) into the transforaminal epidural space.   Additional Comments:  The patient tolerated the procedure well Dressing: 2 x 2 sterile gauze and Band-Aid    Post-procedure details: Patient was observed during the procedure. Post-procedure instructions were reviewed.  Patient left the clinic in stable condition.      Clinical History: No specialty comments available.     Objective:  VS:  HT:    WT:   BMI:     BP:118/65  HR:70bpm  TEMP: ( )  RESP:  Physical Exam Constitutional:      General: She is not in acute distress.    Appearance: Normal appearance. She is not ill-appearing.  HENT:     Head: Normocephalic and atraumatic.     Right Ear: External ear normal.     Left Ear: External ear normal.  Eyes:     Extraocular Movements: Extraocular movements intact.  Cardiovascular:     Rate and Rhythm: Normal rate.     Pulses: Normal pulses.  Musculoskeletal:     Right lower leg: No edema.     Left lower leg: No edema.     Comments: Patient has good distal strength with no pain over the greater trochanters.  No clonus or focal weakness.  Skin:    Findings: No erythema, lesion or rash.  Neurological:     General: No focal deficit present.     Mental Status: She is alert and oriented to person, place, and time.     Sensory: No sensory deficit.     Motor: No weakness or abnormal muscle tone.     Coordination: Coordination normal.  Psychiatric:        Mood and Affect: Mood normal.        Behavior: Behavior normal.      Imaging: No results found.

## 2020-10-29 NOTE — Procedures (Signed)
Lumbosacral Transforaminal Epidural Steroid Injection - Sub-Pedicular Approach with Fluoroscopic Guidance  Patient: Stefanie Carter      Date of Birth: January 03, 1946 MRN: [id number] PCP: Pcp, No      Visit Date: 09/06/2020   Universal Protocol:    Date/Time: 09/06/2020  Consent Given By: the patient  Position: PRONE  Additional Comments: Vital signs were monitored before and after the procedure. Patient was prepped and draped in the usual sterile fashion. The correct patient, procedure, and site was verified.   Injection Procedure Details:   Procedure diagnoses: Spinal stenosis of lumbar region with neurogenic claudication [M48.062]    Meds Administered:  Meds ordered this encounter  Medications  . methylPREDNISolone acetate (DEPO-MEDROL) injection 80 mg    Laterality: Bilateral  Location/Site:  L4-L5  Needle:5.0 in., 22 ga.  Short bevel or Quincke spinal needle  Needle Placement: Transforaminal  Findings:    -Comments: Excellent flow of contrast along the nerve, nerve root and into the epidural space.  Procedure Details: After squaring off the end-plates to get a true AP view, the C-arm was positioned so that an oblique view of the foramen as noted above was visualized. The target area is just inferior to the "nose of the scotty dog" or sub pedicular. The soft tissues overlying this structure were infiltrated with 2-3 ml. of 1% Lidocaine without Epinephrine.  The spinal needle was inserted toward the target using a "trajectory" view along the fluoroscope beam.  Under AP and lateral visualization, the needle was advanced so it did not puncture dura and was located close the 6 O'Clock position of the pedical in AP tracterory. Biplanar projections were used to confirm position. Aspiration was confirmed to be negative for CSF and/or blood. A 1-2 ml. volume of Isovue-250 was injected and flow of contrast was noted at each level. Radiographs were obtained for documentation  purposes.   After attaining the desired flow of contrast documented above, a 0.5 to 1.0 ml test dose of 0.25% Marcaine was injected into each respective transforaminal space.  The patient was observed for 90 seconds post injection.  After no sensory deficits were reported, and normal lower extremity motor function was noted,   the above injectate was administered so that equal amounts of the injectate were placed at each foramen (level) into the transforaminal epidural space.   Additional Comments:  The patient tolerated the procedure well Dressing: 2 x 2 sterile gauze and Band-Aid    Post-procedure details: Patient was observed during the procedure. Post-procedure instructions were reviewed.  Patient left the clinic in stable condition.

## 2020-11-08 ENCOUNTER — Other Ambulatory Visit (HOSPITAL_COMMUNITY): Payer: Self-pay | Admitting: Obstetrics and Gynecology

## 2020-11-08 DIAGNOSIS — N814 Uterovaginal prolapse, unspecified: Secondary | ICD-10-CM | POA: Diagnosis not present

## 2020-11-08 DIAGNOSIS — Z79890 Hormone replacement therapy (postmenopausal): Secondary | ICD-10-CM | POA: Diagnosis not present

## 2020-11-08 DIAGNOSIS — Z4689 Encounter for fitting and adjustment of other specified devices: Secondary | ICD-10-CM | POA: Diagnosis not present

## 2020-11-09 DIAGNOSIS — Z4689 Encounter for fitting and adjustment of other specified devices: Secondary | ICD-10-CM | POA: Diagnosis not present

## 2020-11-09 DIAGNOSIS — R32 Unspecified urinary incontinence: Secondary | ICD-10-CM | POA: Diagnosis not present

## 2020-11-12 ENCOUNTER — Other Ambulatory Visit (HOSPITAL_COMMUNITY): Payer: Self-pay | Admitting: Obstetrics and Gynecology

## 2020-12-04 NOTE — Progress Notes (Signed)
[facility] New Patient Evaluation and Consultation  Referring Provider: Drema Dallas, DO PCP: Wenda Low, MD Date of Service: 12/07/2020  SUBJECTIVE Chief Complaint: New Patient (Initial Visit) Stefanie Carter is a 75 y.o. female complains of prolapse.)  History of Present Illness: Stefanie Carter is a 75 y.o. White or Caucasian female seen in consultation at the request of Dr. Delora Fuel for evaluation of prolapse.    Review of records from Dr Delora Fuel significant for: Tried a 2.5in ring with support pessary. Had multiple episodes of incontinence after placement of the pessary, soaking through.   Urinary Symptoms: Leaks urine with cough/ sneeze, exercise, lifting, going from sitting to standing, with a full bladder, with movement to the bathroom, with urgency, without sensation and while asleep. When she wakes up in the morning, leaks the most.  Leaks 24+ time(s) per day.  Pad use: 8-11 pads per day.   She is bothered by her UI symptoms  Day time voids "ongoing".  Nocturia: every 2 hours Voiding dysfunction: she does not empty her bladder well.  does not use a catheter to empty bladder.  When urinating, she feels a weak stream, difficulty starting urine stream, dribbling after finishing and the need to urinate multiple times in a row   UTIs: 0 UTI's in the last year.   Denies history of blood in urine and kidney or bladder stones  Pelvic Organ Prolapse Symptoms:                  She Admits to a feeling of a bulge the vaginal area. It has been present since Aug 2022 She Admits to seeing a bulge.  This bulge is bothersome. When she had the pessary in place, urine "poured out"  Bowel Symptom: Bowel movements: frequent constipation- BM every 4-5 days Stool consistency: hard, soft  or loose Straining: yes.  Splinting: yes.  Incomplete evacuation: yes.  She admits to accidental bowel leakage / fecal incontinence  Occurs: every day  Consistency with  leakage: solid or soft  Bowel regimen: diet, fiber and miralax, lactulose Last colonoscopy: Date 8 years ago, Results negative  Sexual Function Sexually active: no.   Pelvic Pain Denies pelvic pain   Past Medical History:  Past Medical History:  Diagnosis Date  . Anxiety   . HLD (hyperlipidemia)   . HTN (hypertension)   . Hypothyroid   . Spinal stenosis      Past Surgical History:   Past Surgical History:  Procedure Laterality Date  . APPENDECTOMY    . SALIVARY GLAND SURGERY    . TONSILLECTOMY       Past OB/GYN History: OB History  Gravida Para Term Preterm AB Living  1         0  SAB IAB Ectopic Multiple Live Births               # Outcome Date GA Lbr Len/2nd Weight Sex Delivery Anes PTL Lv  1 Gravida             Menopausal: Yes, Denies vaginal bleeding since menopause Last pap smear was 2021.  Any history of abnormal pap smears: no.   Medications: She has a current medication list which includes the following prescription(s): hydrochlorothiazide, levothyroxine, lorazepam, multiple vitamin, nitrofurantoin (macrocrystal-monohydrate), pravastatin sodium, sertraline hcl, albuterol, levothyroxine sodium, metoprolol tartrate, and premarin.   Allergies: Patient is allergic to penicillins, sulfa antibiotics, and codeine.   Social History:  Social History   Tobacco Use  . Smoking status: Former Smoker  Packs/day: 1.00    Years: 30.00    Pack years: 30.00    Types: Cigarettes    Quit date: 11/04/2011    Years since quitting: 9.0  . Smokeless tobacco: Never Used  Vaping Use  . Vaping Use: Never used  Substance Use Topics  . Alcohol use: Yes    Comment: socially  . Drug use: Never    Relationship status: single She lives alone.   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 Heart disease Mother   . COPD Mother   . Kidney disease Father   . Depression Father    . Glaucoma Father      Review of Systems: Review of Systems  Constitutional: Negative for fever, malaise/fatigue and weight loss.  Respiratory: Positive for cough. Negative for shortness of breath and wheezing.   Cardiovascular: Negative for chest pain, palpitations and leg swelling.  Gastrointestinal: Negative for abdominal pain and blood in stool.  Genitourinary: Negative for dysuria.  Musculoskeletal: Negative for myalgias.  Skin: Negative for rash.  Neurological: Positive for dizziness. Negative for headaches.  Endo/Heme/Allergies: Does not bruise/bleed easily.  Psychiatric/Behavioral: Negative for depression. The patient is not nervous/anxious.      OBJECTIVE Physical Exam: Vitals:   12/07/20 1544  BP: (!) 114/57  Pulse: 68  Weight: 115 lb (52.2 kg)  Height: 4\' 11"  (1.499 m)    Physical Exam Constitutional:      General: She is not in acute distress. Pulmonary:     Effort: Pulmonary effort is normal.  Abdominal:     General: There is no distension.     Palpations: Abdomen is soft.     Tenderness: There is no abdominal tenderness. There is no rebound.  Musculoskeletal:        General: No swelling. Normal range of motion.  Skin:    General: Skin is warm and dry.     Findings: No rash.  Neurological:     Mental Status: She is alert and oriented to person, place, and time.  Psychiatric:        Mood and Affect: Mood normal.        Behavior: Behavior normal.     GU / Detailed Urogynecologic Evaluation:  Pelvic Exam: Normal external female genitalia; Bartholin's and Skene's glands normal in appearance; urethral meatus normal in appearance, no urethral masses or discharge.   CST: negative  Speculum exam reveals normal vaginal mucosa with atrophy. Cervix normal appearance. Uterus normal single, nontender. Adnexa no mass, fullness, tenderness.     Pelvic floor strength I/V, puborectalis II/V external anal sphincter II/V  Pelvic floor musculature: Right levator  non-tender, Right obturator non-tender, Left levator non-tender, Left obturator non-tender  POP-Q:   POP-Q  0                                            Aa   0                                           Ba  1  C   4                                            Gh  4                                            Pb  7                                            tvl   -1                                            Ap  -1                                            Bp  -3                                              D     Rectal Exam:  Normal sphincter tone, small distal rectocele, enterocoele not present, no rectal masses, noted dyssynergia when asking the patient to bear down.  Post-Void Residual (PVR) by Bladder Scan: In order to evaluate bladder emptying, we discussed obtaining a postvoid residual and she agreed to this procedure.  Procedure: The ultrasound unit was placed on the patient's abdomen in the suprapubic region after the patient had voided. A PVR of 241 ml was obtained by bladder scan.  The urethra was prepped with betadine. A catheter was placed to drain residual urine. PVR 372ml.   Laboratory Results: POC urine: Large leukocytes, + nitrites, trace blood  I visualized the urine specimen, noting the specimen to be cloudy yellow  ASSESSMENT AND PLAN [name] is a 75 y.o. with:  1. Incomplete bladder emptying   2. Frequent urination   3. Leukocytes in urine   4. Uterovaginal prolapse, incomplete   5. Prolapse of anterior vaginal wall   6. Prolapse of posterior vaginal wall   7. Rectosphincteric dyssynergia    1. Incomplete bladder emptying - Unclear if she has incomplete emptying due to prolapse or detrusor dysfunction (has spinal stenosis). Will plan for urodynamic testing to better assess.  - She will also need a BMP and renal ultrasound to assess for hydronephrosis and kidney function- ordered  2. Frequent  urination - likely due to incomplete emptying  3. Leukocytes/ nitrites in urine - suspicious for urinary tract infection- will send for culture to confirm but in the meantime with treat with macrobid 100mg  BID x 5days  4. Stage II anterior, Stage II posterior, Stage II apical prolapse For treatment of pelvic organ prolapse, we discussed options for management including expectant management, conservative management, and surgical management, such as Kegels, a pessary, pelvic floor physical therapy, and specific surgical procedures. - Has tried a pessary  but worsened her emptying. Will first assess bladder function then decide on treatment for prolapse at a later time.   5. Constipation/ Dyssynergia - Has difficulty engaging pelvic floor muscles. Recommended pelvic physical therapy but she would like to wait at this time.   Return for urodynamic testing  Jaquita Folds, MD   Medical Decision Making:  - Reviewed/ ordered a clinical laboratory test - Reviewed/ ordered a radiologic study  - Reviewed/ ordered medicine test - Review and summation of prior records

## 2020-12-06 ENCOUNTER — Telehealth: Payer: Self-pay

## 2020-12-06 NOTE — Telephone Encounter (Signed)
Attempt made to contact Stefanie Carter is a 75 y.o. female re: New pt Pre appt call to collect history information. -Allergy -Medication -Confirm pharmacy -OB history   Pt was not available. LM on the VM for the patient to call back

## 2020-12-07 ENCOUNTER — Encounter: Payer: Self-pay | Admitting: Obstetrics and Gynecology

## 2020-12-07 ENCOUNTER — Ambulatory Visit (INDEPENDENT_AMBULATORY_CARE_PROVIDER_SITE_OTHER): Payer: Medicare HMO | Admitting: Obstetrics and Gynecology

## 2020-12-07 ENCOUNTER — Other Ambulatory Visit: Payer: Self-pay

## 2020-12-07 ENCOUNTER — Other Ambulatory Visit: Payer: Self-pay | Admitting: Obstetrics and Gynecology

## 2020-12-07 VITALS — BP 114/57 | HR 68 | Ht 59.0 in | Wt 115.0 lb

## 2020-12-07 DIAGNOSIS — N816 Rectocele: Secondary | ICD-10-CM

## 2020-12-07 DIAGNOSIS — N812 Incomplete uterovaginal prolapse: Secondary | ICD-10-CM | POA: Diagnosis not present

## 2020-12-07 DIAGNOSIS — R198 Other specified symptoms and signs involving the digestive system and abdomen: Secondary | ICD-10-CM

## 2020-12-07 DIAGNOSIS — R82998 Other abnormal findings in urine: Secondary | ICD-10-CM

## 2020-12-07 DIAGNOSIS — R350 Frequency of micturition: Secondary | ICD-10-CM

## 2020-12-07 DIAGNOSIS — R339 Retention of urine, unspecified: Secondary | ICD-10-CM

## 2020-12-07 DIAGNOSIS — N8110 Cystocele, unspecified: Secondary | ICD-10-CM

## 2020-12-07 DIAGNOSIS — K5902 Outlet dysfunction constipation: Secondary | ICD-10-CM

## 2020-12-07 LAB — POCT URINALYSIS DIPSTICK
Appearance: ABNORMAL
Bilirubin, UA: NEGATIVE
Glucose, UA: NEGATIVE
Nitrite, UA: POSITIVE
Protein, UA: NEGATIVE
Spec Grav, UA: 1.015 (ref 1.010–1.025)
Urobilinogen, UA: 0.2 E.U./dL
pH, UA: 7 (ref 5.0–8.0)

## 2020-12-07 MED ORDER — NITROFURANTOIN MONOHYD MACRO 100 MG PO CAPS: 100.0000 mg | ORAL_CAPSULE | Freq: Two times a day (BID) | ORAL | 0 refills | Status: DC

## 2020-12-07 NOTE — Patient Instructions (Addendum)
Constipation: Our goal is to achieve formed bowel movements daily or every-other-day.  You may need to try different combinations of the following options to find what works best for you - everybody's body works differently so feel free to adjust the dosages as needed.  Some options to help maintain bowel health include:  Marland Kitchen Dietary changes (more leafy greens, vegetables and fruits; less processed foods) . Fiber supplementation (Benefiber, FiberCon, Metamucil or Psyllium). Start slow and increase gradually to full dose. . Over-the-counter agents such as: stool softeners (Docusate or Colace) and/or laxatives (Miralax, milk of magnesia)  . "Power Pudding" is a natural mixture that may help your constipation.  To make blend 1 cup applesauce, 1 cup wheat bran, and 3/4 cup prune juice, refrigerate and then take 1 tablespoon daily with a large glass of water as needed.   Urinalysis and/or patient symptoms suggest an active urinary tract infection.  A prescription was given for macrobid x 5 days. We will send a urine culture and adjust the antibiotic if need due to drug resistance.   I have ordered a renal ultrasound and kidney function test (blood work).   URODYNAMICS (UDS) TEST INFORMATION  IMPORTANT: Please try to arrive with a comfortably full bladder!    What is UDS? Marland Kitchen Urodynamics is a bladder test used to evaluate how your bladder and urethra (tube you urinate out of) work to help find out the cause of your bladder symptoms and evaluate your bladder function in order to make the best treatment plan for you.   What to expect? . A nurse will perform the test and will be with you during the entire exam. . First we will have to empty your bladder on a special toilet.  After you have emptied your bladder, very small catheters (plastic tubing) will be placed into your bladder and into your vagina (or rectum). These special small catheters measure pressure to help measure your bladder function.  Your  bladder will be gently filled with water and you will be asked to cough and strain at several different points during the test.   . You will then be asked to empty your bladder in the special toilet with the catheters in place. Most patients can urinate (pee) easily with the catheters in place since the catheters are so small. . In total this procedure lasts about 45 minutes to 1 hour.  . After your test is completed, you will return (or possibly be seen the same day) to review the results, talk about treatment options and make a plan moving forward.

## 2020-12-10 ENCOUNTER — Ambulatory Visit (INDEPENDENT_AMBULATORY_CARE_PROVIDER_SITE_OTHER): Payer: Medicare HMO | Admitting: Obstetrics and Gynecology

## 2020-12-10 ENCOUNTER — Other Ambulatory Visit: Payer: Self-pay | Admitting: Obstetrics and Gynecology

## 2020-12-10 ENCOUNTER — Encounter: Payer: Self-pay | Admitting: Obstetrics and Gynecology

## 2020-12-10 VITALS — BP 125/73 | HR 73 | Wt 112.0 lb

## 2020-12-10 DIAGNOSIS — N812 Incomplete uterovaginal prolapse: Secondary | ICD-10-CM | POA: Diagnosis not present

## 2020-12-10 DIAGNOSIS — N312 Flaccid neuropathic bladder, not elsewhere classified: Secondary | ICD-10-CM | POA: Diagnosis not present

## 2020-12-10 DIAGNOSIS — E039 Hypothyroidism, unspecified: Secondary | ICD-10-CM | POA: Diagnosis not present

## 2020-12-10 DIAGNOSIS — R339 Retention of urine, unspecified: Secondary | ICD-10-CM

## 2020-12-10 DIAGNOSIS — M519 Unspecified thoracic, thoracolumbar and lumbosacral intervertebral disc disorder: Secondary | ICD-10-CM | POA: Diagnosis not present

## 2020-12-10 DIAGNOSIS — N814 Uterovaginal prolapse, unspecified: Secondary | ICD-10-CM | POA: Diagnosis not present

## 2020-12-10 DIAGNOSIS — R82998 Other abnormal findings in urine: Secondary | ICD-10-CM | POA: Diagnosis not present

## 2020-12-10 DIAGNOSIS — F419 Anxiety disorder, unspecified: Secondary | ICD-10-CM | POA: Diagnosis not present

## 2020-12-10 DIAGNOSIS — N393 Stress incontinence (female) (male): Secondary | ICD-10-CM

## 2020-12-10 DIAGNOSIS — I10 Essential (primary) hypertension: Secondary | ICD-10-CM | POA: Diagnosis not present

## 2020-12-10 DIAGNOSIS — R7309 Other abnormal glucose: Secondary | ICD-10-CM | POA: Diagnosis not present

## 2020-12-10 DIAGNOSIS — J449 Chronic obstructive pulmonary disease, unspecified: Secondary | ICD-10-CM | POA: Diagnosis not present

## 2020-12-10 DIAGNOSIS — E782 Mixed hyperlipidemia: Secondary | ICD-10-CM | POA: Diagnosis not present

## 2020-12-10 DIAGNOSIS — M5090 Cervical disc disorder, unspecified, unspecified cervical region: Secondary | ICD-10-CM | POA: Diagnosis not present

## 2020-12-10 NOTE — Patient Instructions (Signed)
Call this number to schedule kidney ultrasound: [phone]  Catheterize at least 4 times per day: when you wake up, at lunch, at dinner and before bed. You can try to urinate prior to catheterizing.

## 2020-12-10 NOTE — Progress Notes (Addendum)
[facility] Return Visit  SUBJECTIVE  History of Present Illness: Stefanie Carter is a 75 y.o. female seen in follow-up after urodynamic testing.   Discussed the results of the urodynamic testing which showed:   1. Sensation was reduced; capacity was increased 2. Stress Incontinence was demonstrated at normal pressures; 3. Detrusor Overactivity was not demonstrated. 4. Emptying was dysfunctional with a elevated PVR, a sustained detrusor contraction not present- detrusor was underactive,  abdominal straining not present, normal urethral sphincter activity on EMG.   Past Medical History: Patient  has a past medical history of Anxiety, HLD (hyperlipidemia), HTN (hypertension), Hypothyroid, and Spinal stenosis.   Past Surgical History: She  has a past surgical history that includes Appendectomy; Tonsillectomy; and Salivary gland surgery.   Medications: She has a current medication list which includes the following prescription(s): albuterol, hydrochlorothiazide, levothyroxine, levothyroxine sodium, lorazepam, metoprolol tartrate, multiple vitamin, nitrofurantoin (macrocrystal-monohydrate), pravastatin sodium, premarin, and sertraline hcl.   Allergies: Patient is allergic to penicillins, sulfa antibiotics, and codeine.   Social History: Patient  reports that she quit smoking about 9 years ago. Her smoking use included cigarettes. She has a 30.00 pack-year smoking history. She has never used smokeless tobacco. She reports current alcohol use. She reports that she does not use drugs.      OBJECTIVE     Physical Exam: Vitals:   12/10/20 1438  BP: 125/73  Pulse: 73  Weight: 112 lb (50.8 kg)   Gen: No apparent distress, A&O x 3.  Detailed Urogynecologic Evaluation:  Deferred.    ASSESSMENT AND PLAN    Stefanie Carter is a 75 y.o. with:  1. Neurogenic bladder, flaccid   2. Incomplete bladder emptying   3. SUI (stress urinary incontinence, female)   4.  Uterovaginal prolapse, incomplete    Neurogenic bladder - We reviewed that incomplete bladder emptying and urinary leakage is not due to prolapse but rather dysfunction of the bladder muscle and overflow incontinence.  - Patient was taught to self-catheterize at today's visit. She was sent home with catheter supplies and will be contacted by [facility] for home cath supplies. She will need to catheterize at least 4 times a day: on waking, at lunch, at dinner and before bed. - Reviewed the importance of catheterizing to empty bladder and protect kidneys. A renal US was also ordered previously to assess for hydronephrosis- she will call to schedule this. Her PCP ordered lab work today, will follow up for most recent kidney function testing.  - We will reassess leakage symptoms after she has catheterized for several weeks.  - Also reviewed the role of Sacral neuromodulation and that this can be useful for urinary retention. She is not interested in a surgical procedure at this time. She is also currently smoking and discussed that placing an implant with smoking can impair healing/ cause infection.   SUI - She has a component of stress incontinence that was likely worsened when the pessary was placed and prolapse was reduced. We can reassess leakage and prolapse symptoms at next visit and she can consider another incontinence type pessary for her symptoms if needed.   Prolapse - She is potentially interested in trying a pessary again since her prolapse is bothersome at times. Will reassess symptoms at next visit.   Return 3 weeks  Jaquita Folds, MD   Time spent: I spent 40 minutes dedicated to the care of this patient on the date of this encounter to include pre-visit review of records, face-to-face time  with the patient discussing the results of her testing and post visit documentation.    Addendum 12/12/2020: Received fax with labs from PCP performed 12/10/20. Cr 0.86/ BUN 22

## 2020-12-10 NOTE — Progress Notes (Signed)
[facility] Urodynamics Procedure  Referring Physician: Wenda Low, MD Date of Procedure: 12/10/2020  Stefanie Carter is a 75 y.o. female who presents for urodynamic evaluation. Indication(s) for study: urinary retention and overactive bladder  Vital Signs: BP 125/73   Pulse 73   Wt 112 lb (50.8 kg)   BMI 22.62 kg/m   Laboratory Results: A catheterized urine specimen revealed:  POC urine: negative Currently taking antibiotics due to recent diagnosis of urinary tract infection.  Voiding Diary: Not completed  Procedure Timeout: The correct patient was verified and the correct procedure was verified. The procedure consent form was reviewed and accurate. The patient was in the correct position and safety precautions were reviewed based on at the patient's history.   Urodynamic Procedure A 42F dual lumen urodynamics catheter was placed under sterile conditions into the patient's bladder. A 42F catheter was placed into the rectum in order to measure abdominal pressure. EMG patches were placed in the appropriate position.  All connections were confirmed and calibrations/adjusted made. Saline was instilled into the bladder through the dual lumen catheters.  Cough/valsalva pressures were measured periodically during filling.  Patient was allowed to void.  The bladder was then emptied of its residual.  UROFLOW: Revealed a Qmax of 13.7 mL/sec.  She voided 84.7 mL and had a residual of 425 mL.  It was a intermittent pattern and represented normal habits.  CMG: This was performed with sterile water in the sitting position at a fill rate of 30 mL/min.    First sensation of fullness was 350 mLs,  First urge was 486 mLs,  Strong urge was 647 mLs and  Capacity was 760 mLs  Stress incontinence was demonstrated SUI was not demonstrated at 256ml. Lowest positive Barrier CLPP was 53 cmH20 at 400 ml. Lowest positive Barrier VLPP was 54 cmH20 at 400 ml.  Detrusor function was  underactive.   Compliance:  Normal. End fill detrusor pressure was 19cmH20.  Calculated compliance was 12mL/cmH20  UPP: MUCP with barrier reduction was 34 cm of water.    MICTURITION STUDY: Voiding was performed with reduction using scopettes in the sitting position.  Pdet at Qmax was 11.3 cm of water.  Qmax was 24 mL/sec.  It was a interrupted pattern.  She voided 510.5 mL and had a residual of 250 mL.  It was a volitional void, detrusor appears underactive without a sustained contraction. Pt had to stand to empty.  EMG: This was performed with patches.  She had voluntary contractions, recruitment with fill was not present and urethral sphincter was relaxed with void initially then EMG leads fell off once patient stood.  The details of the procedure with the study tracings have been scanned into EPIC.   Urodynamic Impression:  1. Sensation was reduced; capacity was increased 2. Stress Incontinence was demonstrated at normal pressures; 3. Detrusor Overactivity was not demonstrated. 4. Emptying was dysfunctional with a elevated PVR, a sustained detrusor contraction not present- detrusor was underactive,  abdominal straining not present, normal urethral sphincter activity on EMG.  Plan: - The patient will follow up  to discuss the findings and treatment options.

## 2020-12-13 LAB — SPECIMEN STATUS REPORT

## 2020-12-13 LAB — URINE CULTURE

## 2020-12-14 ENCOUNTER — Other Ambulatory Visit: Payer: Self-pay | Admitting: Obstetrics and Gynecology

## 2020-12-14 ENCOUNTER — Telehealth: Payer: Self-pay | Admitting: Obstetrics and Gynecology

## 2020-12-14 DIAGNOSIS — N312 Flaccid neuropathic bladder, not elsewhere classified: Secondary | ICD-10-CM

## 2020-12-14 NOTE — Telephone Encounter (Signed)
LMOM that order for home health therapy had been placed and that information had been faxed to [facility]. Advised that I would let her know when confirmation has been received.

## 2020-12-14 NOTE — Progress Notes (Unsigned)
Patient requesting home health nurse for assistance with education of self-catheterizing at home.

## 2020-12-17 ENCOUNTER — Telehealth: Payer: Self-pay | Admitting: *Deleted

## 2020-12-17 NOTE — Telephone Encounter (Signed)
Pt called stating that she had not yet heard anything from the [facility]. Advised pt that her information had been faxed over to them. Advised to give it a couple more days to see if she gets a response. If she doesn't she can call us back and I will follow up. Pt verbalized understanding.

## 2020-12-24 ENCOUNTER — Other Ambulatory Visit: Payer: Self-pay

## 2020-12-24 ENCOUNTER — Ambulatory Visit
Admission: RE | Admit: 2020-12-24 | Discharge: 2020-12-24 | Disposition: A | Discharge status: Home / Self Care | Payer: Medicare HMO | Source: Ambulatory Visit | Attending: Obstetrics and Gynecology | Admitting: Obstetrics and Gynecology

## 2020-12-24 DIAGNOSIS — R339 Retention of urine, unspecified: Secondary | ICD-10-CM | POA: Diagnosis not present

## 2020-12-24 DIAGNOSIS — R319 Hematuria, unspecified: Secondary | ICD-10-CM | POA: Diagnosis not present

## 2020-12-26 ENCOUNTER — Ambulatory Visit: Payer: Medicare HMO

## 2020-12-27 ENCOUNTER — Telehealth: Payer: Self-pay | Admitting: Obstetrics and Gynecology

## 2020-12-28 NOTE — Progress Notes (Signed)
Urogynecology Return Visit  SUBJECTIVE  History of Present Illness: Stefanie Carter is a 75 y.o. female seen in follow-up for neurogenic bladder.  She has been self-catheterizing at home but has had some difficulty with this. She attempts to catheterize a few times a day but doesn't always get it "in". Feels that her prolapse gets in the way sometimes as well. Tries to cath a few times a day.    She had a renal US performed which showed:  Right Kidney: Renal measurements: 9.3 x 3.4 x 5.7 cm = volume: 95.5 mL. Renal echogenicity within normal limits. No nephrolithiasis or hydronephrosis. No focal renal mass.  Left Kidney: Renal measurements: 9.6 x 4.5 x 4.7 cm = volume: 106.2 mL. Renal echogenicity within normal limits. No nephrolithiasis or hydronephrosis. No focal renal mass.  Bladder: Appears normal for degree of bladder distention. Bilateral jets are visualized.   IMPRESSION: Normal renal ultrasound. No nephrolithiasis or hydronephrosis.   Past Medical History: Patient  has a past medical history of Anxiety, HLD (hyperlipidemia), HTN (hypertension), Hypothyroid, and Spinal stenosis.   Past Surgical History: She  has a past surgical history that includes Appendectomy; Tonsillectomy; and Salivary gland surgery.   Medications: She has a current medication list which includes the following prescription(s): albuterol, hydrochlorothiazide, levothyroxine, levothyroxine sodium, lorazepam, metoprolol tartrate, multiple vitamin, pravastatin sodium, premarin, and sertraline hcl.   Allergies: Patient is allergic to penicillins, sulfa antibiotics, and codeine.   Social History: Patient  reports that she quit smoking about 9 years ago. Her smoking use included cigarettes. She has a 30.00 pack-year smoking history. She has never used smokeless tobacco. She reports current alcohol use. She reports that she does not use drugs.      OBJECTIVE     Physical  Exam: Vitals:   01/02/21 1139  BP: (!) 149/75  Pulse: 72  Weight: 114 lb (51.7 kg)   Gen: No apparent distress, A&O x 3.  Detailed Urogynecologic Evaluation:  Normal external genitalia  Pessary fitting:   She was fit with a 2.5in incontinence dish with support. This was too large. A 2in incontinence dish with support was placed and fit well but she had a strong cough and it was expelled. She was then fit with a #2 cube and a 2in gellhorn pessary. Both of these were too large and uncomfortable.   She was then fit with a 2-1/2 in Streeter pessary. It was comfortable, fit well, and stayed in placed with strong cough, valsalva.  Lot # [id number] Exp 07/25/25  She then was able to demonstrate self-catheterization with the pessary in place. 452ml of clear yellow urine was drained.     ASSESSMENT AND PLAN    [name] is a 75 y.o. with:  1. Neurogenic bladder, flaccid   2. Uterovaginal prolapse, incomplete   3. Prolapse of anterior vaginal wall      Neurogenic bladder - We reviewed the results of her renal ultrasound which did not show any hydronephrosis or renal concerns.  - She will continue to catheterize at least 4 times a day: on waking, at lunch, at dinner and before bed. - We had previously requested home health to assist with teaching however they were not available to take new clients, so we then reached out to [facility] per her insurance. Will contact them today for an update.  - We will also reach out to different cath supplier to see if there was an option that was less expensive than [facility] (  currently paying $30 a month).   Prolapse - She was fit today with a 2-1/2 in shaatz pessary. She will keep this in place until the next visit. This pessary allowed her to better visualize her urethra for self-catheterization.   Return 2 weeks for follow up.   Jaquita Folds, MD  Time spent: I spent 35 minutes dedicated to the care of this patient on the date of  this encounter to include pre-visit review of records, face-to-face time with the patient  and post visit documentation. Additional time was spent for procedure.

## 2020-12-31 ENCOUNTER — Telehealth: Payer: Self-pay | Admitting: *Deleted

## 2020-12-31 NOTE — Telephone Encounter (Signed)
DOB verified. Pt called wanting to know results of scan. Informed pt, that per Dr. Wannetta Sender, the scan did not show anything concerning and Dr. Wannetta Sender would go over in detail at her appt on Wednesday. Pt states that she hasnt heard anything from [facility]. Advised that they are probably not going to be able to come to her house daily to help her with self cathing. Advised that we could work with her a little more at her appt. She says the biggest issue for her is that she doesn't want to have a contract with [employer] [facility] and pay $30/month for the catheters. Advised pt that the catheters could also be clean with soap and water. I asked pt what symptoms she was having that she is concerned most about. She states that she in unable to empty her bladder or her bowels fully.  Advised to continue to try and self cath per Dr Tommas Olp instruction and we would follow up on this at her appt. on Wednesday. Pt verbalized understanding.

## 2020-12-31 NOTE — Telephone Encounter (Signed)
LMOM for pt to return call. 

## 2021-01-02 ENCOUNTER — Encounter: Payer: Self-pay | Admitting: Obstetrics and Gynecology

## 2021-01-02 ENCOUNTER — Ambulatory Visit (INDEPENDENT_AMBULATORY_CARE_PROVIDER_SITE_OTHER): Payer: Medicare HMO | Admitting: Obstetrics and Gynecology

## 2021-01-02 VITALS — BP 149/75 | HR 72 | Wt 114.0 lb

## 2021-01-02 DIAGNOSIS — N812 Incomplete uterovaginal prolapse: Secondary | ICD-10-CM | POA: Diagnosis not present

## 2021-01-02 DIAGNOSIS — N312 Flaccid neuropathic bladder, not elsewhere classified: Secondary | ICD-10-CM

## 2021-01-02 DIAGNOSIS — N8110 Cystocele, unspecified: Secondary | ICD-10-CM

## 2021-01-02 NOTE — Patient Instructions (Signed)
You were fit with a Shaatz pessary. You are electing to leave it in place until your follow up visit. If it falls out you can try to put it back in or you can leave it out. If it seems to be slipping down you can use your finger to push it back in deeper - the deeper it is, the better fit.

## 2021-01-03 ENCOUNTER — Telehealth: Payer: Self-pay | Admitting: Obstetrics and Gynecology

## 2021-01-03 ENCOUNTER — Telehealth: Payer: Self-pay | Admitting: Physical Medicine and Rehabilitation

## 2021-01-03 NOTE — Telephone Encounter (Signed)
Bilateral L4 TF 09/06/20. Ok to repeat if helped, same problem/side, and no new injury?

## 2021-01-03 NOTE — Telephone Encounter (Signed)
Attempted to call the patient re: doc below.  Pt was not available. LM on the VM for the patient to call back. --- Carlye Grippe [phone]  Durwin Glaze L 1 hour ago (12:31 PM)   Theone Murdoch  You 1 hour ago (12:30 PM)   AM  Pt called office requesting to inform nurse that she has been able to cath herself at home since the pessary was inserted at her last visit. She states that she feels home health assistance is no longer needed at this time. In addition, pt requests to receive an updated status on assistance with receiving catheters through an assistance program that was previously discussed. Please call pt and advise once request is reviewed.   Routing comment

## 2021-01-03 NOTE — Telephone Encounter (Signed)
Patient called requesting an appt for back injections. Please call patient about this at [phone].

## 2021-01-04 ENCOUNTER — Telehealth: Payer: Self-pay | Admitting: Physical Medicine and Rehabilitation

## 2021-01-04 NOTE — Telephone Encounter (Signed)
Left message #1

## 2021-01-04 NOTE — Telephone Encounter (Signed)
ok 

## 2021-01-04 NOTE — Telephone Encounter (Signed)
Patient returned call to [facility]. Please call patient at [phone]

## 2021-01-04 NOTE — Telephone Encounter (Signed)
Needs auth for bilateral L4 TF. Scheduled for 3/29 with driver.

## 2021-01-04 NOTE — Telephone Encounter (Signed)
Authorization #[id number]

## 2021-01-04 NOTE — Telephone Encounter (Signed)
See previous message

## 2021-01-07 DIAGNOSIS — R339 Retention of urine, unspecified: Secondary | ICD-10-CM | POA: Diagnosis not present

## 2021-01-10 NOTE — Progress Notes (Deleted)
[facility] Return Visit  SUBJECTIVE  History of Present Illness: Stefanie Carter is a 75 y.o. female seen in follow-up for neurogenic bladder.  At last visit she was fit with a 2-1/2 in [facility] pessary. She has been able to self-catheterize better now with the pessary in place and does not feel she needs home health assistance.    Past Medical History: Patient  has a past medical history of Anxiety, HLD (hyperlipidemia), HTN (hypertension), Hypothyroid, and Spinal stenosis.   Past Surgical History: She  has a past surgical history that includes Appendectomy; Tonsillectomy; and Salivary gland surgery.   Medications: She has a current medication list which includes the following prescription(s): albuterol, hydrochlorothiazide, levothyroxine, levothyroxine sodium, lorazepam, metoprolol tartrate, multiple vitamin, pravastatin sodium, premarin, and sertraline hcl.   Allergies: Patient is allergic to penicillins, sulfa antibiotics, and codeine.   Social History: Patient  reports that she quit smoking about 9 years ago. Her smoking use included cigarettes. She has a 30.00 pack-year smoking history. She has never used smokeless tobacco. She reports current alcohol use. She reports that she does not use drugs.      OBJECTIVE     Physical Exam: There were no vitals filed for this visit. Gen: No apparent distress, A&O x 3.  Detailed Urogynecologic Evaluation:  Normal external genitalia  The pessary was noted to be in place. It was removed. Speculum exam revealed no erosions***. The pessary was cleaned and replaced.     ASSESSMENT AND PLAN    Stefanie Carter is a 75 y.o. with:  No diagnosis found.   Neurogenic bladder - We reviewed the results of her renal ultrasound which did not show any hydronephrosis or renal concerns.  - She will continue to catheterize at least 4 times a day: on waking, at lunch, at dinner and before bed. - We had previously requested home  health to assist with teaching however they were not available to take new clients, so we then reached out to [facility] home health per her insurance. Will contact them today for an update.  - We will also reach out to different cath supplier to see if there was an option that was less expensive than [facility] (currently paying $30 a month).   Prolapse - She was fit today with a 2-1/2 in shaatz pessary. She will keep this in place until the next visit. This pessary allowed her to better visualize her urethra for self-catheterization.   Return 2 weeks for follow up.   Stefanie Folds, MD  Time spent: I spent 35 minutes dedicated to the care of this patient on the date of this encounter to include pre-visit review of records, face-to-face time with the patient  and post visit documentation. Additional time was spent for procedure.

## 2021-01-16 ENCOUNTER — Ambulatory Visit: Payer: Medicare HMO | Admitting: Obstetrics and Gynecology

## 2021-01-17 NOTE — Telephone Encounter (Signed)
completed

## 2021-01-22 ENCOUNTER — Other Ambulatory Visit: Payer: Self-pay

## 2021-01-22 ENCOUNTER — Ambulatory Visit (INDEPENDENT_AMBULATORY_CARE_PROVIDER_SITE_OTHER): Payer: Medicare HMO | Admitting: Physical Medicine and Rehabilitation

## 2021-01-22 ENCOUNTER — Ambulatory Visit: Payer: Self-pay

## 2021-01-22 ENCOUNTER — Encounter: Payer: Self-pay | Admitting: Physical Medicine and Rehabilitation

## 2021-01-22 VITALS — BP 118/74 | HR 69

## 2021-01-22 DIAGNOSIS — M5416 Radiculopathy, lumbar region: Secondary | ICD-10-CM

## 2021-01-22 DIAGNOSIS — M48062 Spinal stenosis, lumbar region with neurogenic claudication: Secondary | ICD-10-CM

## 2021-01-22 MED ORDER — METHYLPREDNISOLONE ACETATE 80 MG/ML IJ SUSP
80.0000 mg | Freq: Once | INTRAMUSCULAR | Status: AC
  Administered 2021-01-22: 80 mg

## 2021-01-22 NOTE — Progress Notes (Signed)
Stefanie Carter - 75 y.o. female MRN [id number]  Date of birth: 06-Aug-1946  Office Visit Note: Visit Date: 01/22/2021 PCP: Wenda Low, MD Referred by: Wenda Low, MD  Subjective: Chief Complaint  Patient presents with  . Lower Back - Pain  . Left Leg - Pain   HPI:  Stefanie Carter is a 75 y.o. female who comes in today for planned repeat Bilateral L4-L5 Lumbar epidural steroid injection with fluoroscopic guidance.  The patient has failed conservative care including home exercise, medications, time and activity modification.  This injection will be diagnostic and hopefully therapeutic.  Please see requesting physician notes for further details and justification. Prior injection did offer more than 85% relief as noted above and did give the patient more functional ability for activities of daily living and was also beneficial in that it did reduce their medication requirement.  They have had physical therapy and continue with home exercises.  Current medication management is not beneficial in increasing their functional status.  Procedures are done as part of a comprehensive orthopedic and pain management program with access to in-house orthopedics, spine surgery and physical therapy as well as access to [facility] counseling if needed.  Referring: Dr. Rodell Perna    ROS Otherwise per HPI.  Assessment & Plan: Visit Diagnoses:    ICD-10-CM   1. Lumbar radiculopathy  M54.16 XR C-ARM NO REPORT    Epidural Steroid injection    methylPREDNISolone acetate (DEPO-MEDROL) injection 80 mg  2. Spinal stenosis of lumbar region with neurogenic claudication  M48.062 XR C-ARM NO REPORT    Epidural Steroid injection    methylPREDNISolone acetate (DEPO-MEDROL) injection 80 mg    Plan: No additional findings.   Meds & Orders:  Meds ordered this encounter  Medications  . methylPREDNISolone acetate (DEPO-MEDROL) injection 80 mg    Orders Placed This  Encounter  Procedures  . XR C-ARM NO REPORT  . Epidural Steroid injection    Follow-up: Return if symptoms worsen or fail to improve.   Procedures: No procedures performed  Lumbosacral Transforaminal Epidural Steroid Injection - Sub-Pedicular Approach with Fluoroscopic Guidance  Patient: Stefanie Carter      Date of Birth: Oct 15, 1946 MRN: [id number] PCP: Wenda Low, MD      Visit Date: 01/22/2021   Universal Protocol:    Date/Time: 01/22/2021  Consent Given By: the patient  Position: PRONE  Additional Comments: Vital signs were monitored before and after the procedure. Patient was prepped and draped in the usual sterile fashion. The correct patient, procedure, and site was verified.   Injection Procedure Details:   Procedure diagnoses: Lumbar radiculopathy [M54.16]    Meds Administered:  Meds ordered this encounter  Medications  . methylPREDNISolone acetate (DEPO-MEDROL) injection 80 mg    Laterality: Bilateral  Location/Site:  L4-L5  Needle:5.0 in., 22 ga.  Short bevel or Quincke spinal needle  Needle Placement: Transforaminal  Findings:    -Comments: Excellent flow of contrast along the nerve, nerve root and into the epidural space.  Procedure Details: After squaring off the end-plates to get a true AP view, the C-arm was positioned so that an oblique view of the foramen as noted above was visualized. The target area is just inferior to the "nose of the scotty dog" or sub pedicular. The soft tissues overlying this structure were infiltrated with 2-3 ml. of 1% Lidocaine without Epinephrine.  The spinal needle was inserted toward the target using a "trajectory" view along the fluoroscope beam.  Under AP  and lateral visualization, the needle was advanced so it did not puncture dura and was located close the 6 O'Clock position of the pedical in AP tracterory. Biplanar projections were used to confirm position. Aspiration was confirmed to be negative for CSF  and/or blood. A 1-2 ml. volume of Isovue-250 was injected and flow of contrast was noted at each level. Radiographs were obtained for documentation purposes.   After attaining the desired flow of contrast documented above, a 0.5 to 1.0 ml test dose of 0.25% Marcaine was injected into each respective transforaminal space.  The patient was observed for 90 seconds post injection.  After no sensory deficits were reported, and normal lower extremity motor function was noted,   the above injectate was administered so that equal amounts of the injectate were placed at each foramen (level) into the transforaminal epidural space.   Additional Comments:  The patient tolerated the procedure well Dressing: 2 x 2 sterile gauze and Band-Aid    Post-procedure details: Patient was observed during the procedure. Post-procedure instructions were reviewed.  Patient left the clinic in stable condition.      Clinical History: No specialty comments available.     Objective:  VS:  HT:    WT:   BMI:     BP:118/74  HR:69bpm  TEMP: ( )  RESP:  Physical Exam Vitals and nursing note reviewed.  Constitutional:      General: She is not in acute distress.    Appearance: Normal appearance. She is not ill-appearing.  HENT:     Head: Normocephalic and atraumatic.     Right Ear: External ear normal.     Left Ear: External ear normal.  Eyes:     Extraocular Movements: Extraocular movements intact.  Cardiovascular:     Rate and Rhythm: Normal rate.     Pulses: Normal pulses.  Pulmonary:     Effort: Pulmonary effort is normal. No respiratory distress.  Abdominal:     General: There is no distension.     Palpations: Abdomen is soft.  Musculoskeletal:        General: Tenderness present.     Cervical back: Neck supple.     Right lower leg: No edema.     Left lower leg: No edema.     Comments: Patient has good distal strength with no pain over the greater trochanters.  No clonus or focal weakness.   Skin:    Findings: No erythema, lesion or rash.  Neurological:     General: No focal deficit present.     Mental Status: She is alert and oriented to person, place, and time.     Sensory: No sensory deficit.     Motor: No weakness or abnormal muscle tone.     Coordination: Coordination normal.  Psychiatric:        Mood and Affect: Mood normal.        Behavior: Behavior normal.      Imaging: No results found.

## 2021-01-22 NOTE — Patient Instructions (Signed)

## 2021-01-22 NOTE — Progress Notes (Signed)
Pt state lower back pain that travels down to her left thigh. Pt state laying and standing makes the pain worse. Pt state she take pain meds to help ease the pain. Pt has hx of inj on 09/06/20 pt state it worked for a few weeks with 85% relief.  Numeric Pain Rating Scale and Functional Assessment Average Pain 7   In the last MONTH (on 0-10 scale) has pain interfered with the following?  1. General activity like being  able to carry out your everyday physical activities such as walking, climbing stairs, carrying groceries, or moving a chair?  Rating(9)   +Driver, -BT, -Dye Allergies.

## 2021-01-22 NOTE — Procedures (Signed)
Lumbosacral Transforaminal Epidural Steroid Injection - Sub-Pedicular Approach with Fluoroscopic Guidance  Patient: Stefanie Carter      Date of Birth: [birth year] MRN: [id number] PCP: Wenda Low, MD      Visit Date: 01/22/2021   Universal Protocol:    Date/Time: 01/22/2021  Consent Given By: the patient  Position: PRONE  Additional Comments: Vital signs were monitored before and after the procedure. Patient was prepped and draped in the usual sterile fashion. The correct patient, procedure, and site was verified.   Injection Procedure Details:   Procedure diagnoses: Lumbar radiculopathy [M54.16]    Meds Administered:  Meds ordered this encounter  Medications  . methylPREDNISolone acetate (DEPO-MEDROL) injection 80 mg    Laterality: Bilateral  Location/Site:  L4-L5  Needle:5.0 in., 22 ga.  Short bevel or Quincke spinal needle  Needle Placement: Transforaminal  Findings:    -Comments: Excellent flow of contrast along the nerve, nerve root and into the epidural space.  Procedure Details: After squaring off the end-plates to get a true AP view, the C-arm was positioned so that an oblique view of the foramen as noted above was visualized. The target area is just inferior to the "nose of the scotty dog" or sub pedicular. The soft tissues overlying this structure were infiltrated with 2-3 ml. of 1% Lidocaine without Epinephrine.  The spinal needle was inserted toward the target using a "trajectory" view along the fluoroscope beam.  Under AP and lateral visualization, the needle was advanced so it did not puncture dura and was located close the 6 O'Clock position of the pedical in AP tracterory. Biplanar projections were used to confirm position. Aspiration was confirmed to be negative for CSF and/or blood. A 1-2 ml. volume of Isovue-250 was injected and flow of contrast was noted at each level. Radiographs were obtained for documentation purposes.   After attaining the  desired flow of contrast documented above, a 0.5 to 1.0 ml test dose of 0.25% Marcaine was injected into each respective transforaminal space.  The patient was observed for 90 seconds post injection.  After no sensory deficits were reported, and normal lower extremity motor function was noted,   the above injectate was administered so that equal amounts of the injectate were placed at each foramen (level) into the transforaminal epidural space.   Additional Comments:  The patient tolerated the procedure well Dressing: 2 x 2 sterile gauze and Band-Aid    Post-procedure details: Patient was observed during the procedure. Post-procedure instructions were reviewed.  Patient left the clinic in stable condition.

## 2021-01-28 DIAGNOSIS — R339 Retention of urine, unspecified: Secondary | ICD-10-CM | POA: Diagnosis not present

## 2021-02-01 DIAGNOSIS — F325 Major depressive disorder, single episode, in full remission: Secondary | ICD-10-CM | POA: Diagnosis not present

## 2021-02-01 DIAGNOSIS — K219 Gastro-esophageal reflux disease without esophagitis: Secondary | ICD-10-CM | POA: Diagnosis not present

## 2021-02-01 DIAGNOSIS — I10 Essential (primary) hypertension: Secondary | ICD-10-CM | POA: Diagnosis not present

## 2021-02-01 DIAGNOSIS — E039 Hypothyroidism, unspecified: Secondary | ICD-10-CM | POA: Diagnosis not present

## 2021-02-01 DIAGNOSIS — E782 Mixed hyperlipidemia: Secondary | ICD-10-CM | POA: Diagnosis not present

## 2021-02-01 DIAGNOSIS — M1990 Unspecified osteoarthritis, unspecified site: Secondary | ICD-10-CM | POA: Diagnosis not present

## 2021-02-01 DIAGNOSIS — J449 Chronic obstructive pulmonary disease, unspecified: Secondary | ICD-10-CM | POA: Diagnosis not present

## 2021-02-27 ENCOUNTER — Other Ambulatory Visit: Payer: Self-pay | Admitting: Internal Medicine

## 2021-02-27 ENCOUNTER — Other Ambulatory Visit (HOSPITAL_COMMUNITY): Payer: Self-pay

## 2021-03-01 ENCOUNTER — Other Ambulatory Visit: Payer: Self-pay | Admitting: Internal Medicine

## 2021-03-01 ENCOUNTER — Other Ambulatory Visit (HOSPITAL_COMMUNITY): Payer: Self-pay

## 2021-03-07 ENCOUNTER — Other Ambulatory Visit: Payer: Self-pay | Admitting: Internal Medicine

## 2021-03-07 ENCOUNTER — Other Ambulatory Visit (HOSPITAL_COMMUNITY): Payer: Self-pay

## 2021-03-08 ENCOUNTER — Other Ambulatory Visit (HOSPITAL_COMMUNITY): Payer: Self-pay

## 2021-03-11 ENCOUNTER — Other Ambulatory Visit (HOSPITAL_COMMUNITY): Payer: Self-pay

## 2021-03-11 DIAGNOSIS — F419 Anxiety disorder, unspecified: Secondary | ICD-10-CM | POA: Diagnosis not present

## 2021-03-11 DIAGNOSIS — M519 Unspecified thoracic, thoracolumbar and lumbosacral intervertebral disc disorder: Secondary | ICD-10-CM | POA: Diagnosis not present

## 2021-03-11 DIAGNOSIS — I10 Essential (primary) hypertension: Secondary | ICD-10-CM | POA: Diagnosis not present

## 2021-03-11 DIAGNOSIS — E039 Hypothyroidism, unspecified: Secondary | ICD-10-CM | POA: Diagnosis not present

## 2021-03-11 DIAGNOSIS — J449 Chronic obstructive pulmonary disease, unspecified: Secondary | ICD-10-CM | POA: Diagnosis not present

## 2021-03-11 DIAGNOSIS — R7303 Prediabetes: Secondary | ICD-10-CM | POA: Diagnosis not present

## 2021-03-11 DIAGNOSIS — K219 Gastro-esophageal reflux disease without esophagitis: Secondary | ICD-10-CM | POA: Diagnosis not present

## 2021-03-11 DIAGNOSIS — K5900 Constipation, unspecified: Secondary | ICD-10-CM | POA: Diagnosis not present

## 2021-03-11 DIAGNOSIS — E782 Mixed hyperlipidemia: Secondary | ICD-10-CM | POA: Diagnosis not present

## 2021-03-11 MED ORDER — LORAZEPAM 0.5 MG PO TABS
ORAL_TABLET | ORAL | 3 refills | Status: DC
  Filled 2021-03-11: qty 60, 30d supply, fill #0
  Filled 2021-04-22: qty 60, 30d supply, fill #1
  Filled 2021-05-27: qty 60, 30d supply, fill #2

## 2021-04-22 ENCOUNTER — Other Ambulatory Visit (HOSPITAL_COMMUNITY): Payer: Self-pay

## 2021-04-22 MED ORDER — LIDOCAINE VISCOUS HCL 2 % MT SOLN
OROMUCOSAL | 0 refills | Status: DC
  Filled 2021-04-22: qty 100, 10d supply, fill #0

## 2021-05-27 ENCOUNTER — Telehealth: Payer: Self-pay | Admitting: Physical Medicine and Rehabilitation

## 2021-05-27 ENCOUNTER — Other Ambulatory Visit (HOSPITAL_COMMUNITY): Payer: Self-pay

## 2021-05-27 NOTE — Telephone Encounter (Signed)
Pt calling to let nurses know she will be gone for the rest of the day but to please leave a message with the next available appt. The best call back number is [phone].

## 2021-05-27 NOTE — Telephone Encounter (Signed)
Bilateral L4 TF 01/22/21. Ok to repeat if helped, same problem/side, and no new injury?

## 2021-05-27 NOTE — Telephone Encounter (Signed)
Pt called and is wanting the same injection as last time Bilateral L4 TF. She said leave a message if she does not answer.   CB [phone]

## 2021-05-28 NOTE — Telephone Encounter (Signed)
See previous message

## 2021-05-28 NOTE — Telephone Encounter (Signed)
70% relief from  last injection. Needs auth for repeat bilateral L4 TF. Patient has not been scheduled.

## 2021-05-28 NOTE — Telephone Encounter (Signed)
Left message #1

## 2021-05-28 NOTE — Telephone Encounter (Signed)
Pt called stating she missed a call but will be available until 1 today; pt also stated she reached out to [facility] in regards to an inj and she states they approved her.   [phone]

## 2021-05-30 NOTE — Telephone Encounter (Signed)
Patient returned your call. I was not sure if it had been authorized so I did not call her back.

## 2021-05-31 NOTE — Telephone Encounter (Signed)
Left message #3

## 2021-06-10 DIAGNOSIS — E039 Hypothyroidism, unspecified: Secondary | ICD-10-CM | POA: Diagnosis not present

## 2021-06-10 DIAGNOSIS — Z0000 Encounter for general adult medical examination without abnormal findings: Secondary | ICD-10-CM | POA: Diagnosis not present

## 2021-06-10 DIAGNOSIS — M519 Unspecified thoracic, thoracolumbar and lumbosacral intervertebral disc disorder: Secondary | ICD-10-CM | POA: Diagnosis not present

## 2021-06-10 DIAGNOSIS — M797 Fibromyalgia: Secondary | ICD-10-CM | POA: Diagnosis not present

## 2021-06-10 DIAGNOSIS — R7303 Prediabetes: Secondary | ICD-10-CM | POA: Diagnosis not present

## 2021-06-10 DIAGNOSIS — I10 Essential (primary) hypertension: Secondary | ICD-10-CM | POA: Diagnosis not present

## 2021-06-10 DIAGNOSIS — F419 Anxiety disorder, unspecified: Secondary | ICD-10-CM | POA: Diagnosis not present

## 2021-06-10 DIAGNOSIS — J449 Chronic obstructive pulmonary disease, unspecified: Secondary | ICD-10-CM | POA: Diagnosis not present

## 2021-06-10 DIAGNOSIS — E782 Mixed hyperlipidemia: Secondary | ICD-10-CM | POA: Diagnosis not present

## 2021-06-10 DIAGNOSIS — Z1389 Encounter for screening for other disorder: Secondary | ICD-10-CM | POA: Diagnosis not present

## 2021-06-10 DIAGNOSIS — M5090 Cervical disc disorder, unspecified, unspecified cervical region: Secondary | ICD-10-CM | POA: Diagnosis not present

## 2021-06-11 DIAGNOSIS — M62838 Other muscle spasm: Secondary | ICD-10-CM | POA: Diagnosis not present

## 2021-06-11 DIAGNOSIS — M25512 Pain in left shoulder: Secondary | ICD-10-CM | POA: Diagnosis not present

## 2021-06-13 DIAGNOSIS — M25512 Pain in left shoulder: Secondary | ICD-10-CM | POA: Diagnosis not present

## 2021-06-17 ENCOUNTER — Other Ambulatory Visit: Payer: Self-pay

## 2021-06-17 ENCOUNTER — Ambulatory Visit: Payer: Self-pay

## 2021-06-17 ENCOUNTER — Ambulatory Visit: Payer: Medicare HMO | Admitting: Physical Medicine and Rehabilitation

## 2021-06-17 ENCOUNTER — Encounter: Payer: Self-pay | Admitting: Physical Medicine and Rehabilitation

## 2021-06-17 VITALS — BP 116/73 | HR 66

## 2021-06-17 DIAGNOSIS — M48062 Spinal stenosis, lumbar region with neurogenic claudication: Secondary | ICD-10-CM | POA: Diagnosis not present

## 2021-06-17 DIAGNOSIS — M5416 Radiculopathy, lumbar region: Secondary | ICD-10-CM

## 2021-06-17 MED ORDER — METHYLPREDNISOLONE ACETATE 80 MG/ML IJ SUSP
80.0000 mg | Freq: Once | INTRAMUSCULAR | Status: AC
  Administered 2021-06-17: 80 mg

## 2021-06-17 NOTE — Progress Notes (Signed)
116/73 66 Pt state lower back pain that travels to her left leg and pain in her right foot. Pt state walking, standing and laying down makes the pain worse. Pt state she takes over the counter pain meds to help ease her pain.  Pt has hx of inj on 01/22/21 pt state it helped.  Numeric Pain Rating Scale and Functional Assessment Average Pain 8   In the last MONTH (on 0-10 scale) has pain interfered with the following?  1. General activity like being  able to carry out your everyday physical activities such as walking, climbing stairs, carrying groceries, or moving a chair?  Rating(10)   +Driver, -BT, -Dye Allergies.

## 2021-06-17 NOTE — Patient Instructions (Signed)

## 2021-06-18 NOTE — Progress Notes (Signed)
Stefanie Carter - 75 y.o. female MRN [id number]  Date of birth: 05-03-1946  Office Visit Note: Visit Date: 06/17/2021 PCP: Wenda Low, MD Referred by: Wenda Low, MD  Subjective: Chief Complaint  Patient presents with   Lower Back - Pain   Left Leg - Pain   Right Foot - Pain   HPI:  Stefanie Carter is a 75 y.o. female who comes in today for planned repeat Bilateral L4-L5  Lumbar Transforaminal epidural steroid injection with fluoroscopic guidance.  The patient has failed conservative care including home exercise, medications, time and activity modification.  This injection will be diagnostic and hopefully therapeutic.  Please see requesting physician notes for further details and justification. Patient received more than 50% pain relief from prior injection.  She reports pretty good relief with prior injection.  We discussed follow-up with Dr. Rodell Perna for surgical consideration.  Likely would require fusion at this level.  She does not really want to have surgery if the injections are helping.  We will do the injection today.  She also complaining of shoulder pain on the left.  She has seen her primary physicians at [facility] and they have referred her to a sports medicine physician in their practice.  I also encouraged her to follow-up with Dr. Lorin Mercy for her shoulder as well.  She has seen multiple orthopedic surgeons in our office in the past.  Referring: Dr. Rodell Perna   ROS Otherwise per HPI.  Assessment & Plan: Visit Diagnoses:    ICD-10-CM   1. Lumbar radiculopathy  M54.16 XR C-ARM NO REPORT    Epidural Steroid injection    methylPREDNISolone acetate (DEPO-MEDROL) injection 80 mg    2. Spinal stenosis of lumbar region with neurogenic claudication  M48.062 XR C-ARM NO REPORT    Epidural Steroid injection    methylPREDNISolone acetate (DEPO-MEDROL) injection 80 mg      Plan: No additional findings.   Meds & Orders:  Meds ordered this encounter   Medications   methylPREDNISolone acetate (DEPO-MEDROL) injection 80 mg    Orders Placed This Encounter  Procedures   XR C-ARM NO REPORT   Epidural Steroid injection    Follow-up: Return if symptoms worsen or fail to improve.   Procedures: No procedures performed  Lumbosacral Transforaminal Epidural Steroid Injection - Sub-Pedicular Approach with Fluoroscopic Guidance  Patient: Stefanie Carter      Date of Birth: 03-31-1946 MRN: [id number] PCP: Wenda Low, MD      Visit Date: 06/17/2021   Universal Protocol:    Date/Time: 06/17/2021  Consent Given By: the patient  Position: PRONE  Additional Comments: Vital signs were monitored before and after the procedure. Patient was prepped and draped in the usual sterile fashion. The correct patient, procedure, and site was verified.   Injection Procedure Details:   Procedure diagnoses: Lumbar radiculopathy [M54.16]    Meds Administered:  Meds ordered this encounter  Medications   methylPREDNISolone acetate (DEPO-MEDROL) injection 80 mg    Laterality: Bilateral  Location/Site:  L4-L5  Needle:5.0 in., 22 ga.  Short bevel or Quincke spinal needle  Needle Placement: Transforaminal  Findings:    -Comments: Excellent flow of contrast along the nerve, nerve root and into the epidural space.  Procedure Details: After squaring off the end-plates to get a true AP view, the C-arm was positioned so that an oblique view of the foramen as noted above was visualized. The target area is just inferior to the "nose of the scotty dog" or sub pedicular. The  soft tissues overlying this structure were infiltrated with 2-3 ml. of 1% Lidocaine without Epinephrine.  The spinal needle was inserted toward the target using a "trajectory" view along the fluoroscope beam.  Under AP and lateral visualization, the needle was advanced so it did not puncture dura and was located close the 6 O'Clock position of the pedical in AP tracterory.  Biplanar projections were used to confirm position. Aspiration was confirmed to be negative for CSF and/or blood. A 1-2 ml. volume of Isovue-250 was injected and flow of contrast was noted at each level. Radiographs were obtained for documentation purposes.   After attaining the desired flow of contrast documented above, a 0.5 to 1.0 ml test dose of 0.25% Marcaine was injected into each respective transforaminal space.  The patient was observed for 90 seconds post injection.  After no sensory deficits were reported, and normal lower extremity motor function was noted,   the above injectate was administered so that equal amounts of the injectate were placed at each foramen (level) into the transforaminal epidural space.   Additional Comments:  The patient tolerated the procedure well Dressing: 2 x 2 sterile gauze and Band-Aid    Post-procedure details: Patient was observed during the procedure. Post-procedure instructions were reviewed.  Patient left the clinic in stable condition.    Clinical History: MRI LUMBAR SPINE WITHOUT CONTRAST   TECHNIQUE: Multiplanar, multisequence MR imaging of the lumbar spine was performed. No intravenous contrast was administered.   COMPARISON:  MRI 06/14/2010.  Radiography 07/12/2020.   FINDINGS: Segmentation: 5 lumbar type vertebral bodies numbered as previous, with L5 being sacralized and transitional.   Alignment: 6 mm anterolisthesis L4-5. This is similar to the study of 2011.   Vertebrae:  No fracture or primary bone lesion.   Conus medullaris and cauda equina: Conus extends to the L1-2 level. Conus and cauda equina appear normal.   Paraspinal and other soft tissues: Negative   Disc levels:   Disc bulges at every level from T8-9 through L1-2. No apparent compressive stenosis of the canal or foramina however.   L2-3: Bulging of the disc. Facet and ligamentous hypertrophy. Mild multifactorial stenosis but without focal neural  compression.   L3-4: Mild bulging of the disc. Mild facet and ligamentous prominence. Mild narrowing of the lateral recesses and foramina but without definite neural compression.   L4-5: Chronic facet arthropathy with 6 mm of anterolisthesis. Pseudo disc herniation. Severe stenosis of the canal at and just below the disc level. Neural compression quite likely at this level. Bilateral foraminal stenosis could compress either or both exiting L4 nerves. This stenosis has probably worsened slightly compared the study of 2011.   L5-S1: Transitional level.  No canal or foraminal stenosis.   IMPRESSION: 1. L5 is a sacralized and transitional vertebra. 2. Severe multifactorial spinal stenosis at the L4-5 level, slightly worsened compared to the study of 2011. Chronic facet arthropathy with 6 mm of anterolisthesis. Pseudo disc herniation. Bilateral foraminal stenosis could compress either or both exiting L4 nerves. 3. Mild multifactorial stenosis at L2-3 and L3-4 but without distinct neural compression.     Electronically Signed   By: Nelson Chimes M.D.   On: 08/04/2020 12:47     Objective:  VS:  HT:    WT:   BMI:     BP:116/73  HR:66bpm  TEMP: ( )  RESP:  Physical Exam Vitals and nursing note reviewed.  Constitutional:      General: She is not in acute distress.  Appearance: Normal appearance. She is not ill-appearing.  HENT:     Head: Normocephalic and atraumatic.     Right Ear: External ear normal.     Left Ear: External ear normal.  Eyes:     Extraocular Movements: Extraocular movements intact.  Cardiovascular:     Rate and Rhythm: Normal rate.     Pulses: Normal pulses.  Pulmonary:     Effort: Pulmonary effort is normal. No respiratory distress.  Abdominal:     General: There is no distension.     Palpations: Abdomen is soft.  Musculoskeletal:        General: Tenderness present.     Cervical back: Neck supple.     Right lower leg: No edema.     Left lower  leg: No edema.     Comments: Patient has good distal strength with no pain over the greater trochanters.  No clonus or focal weakness.  Skin:    Findings: No erythema, lesion or rash.  Neurological:     General: No focal deficit present.     Mental Status: She is alert and oriented to person, place, and time.     Sensory: No sensory deficit.     Motor: No weakness or abnormal muscle tone.     Coordination: Coordination normal.  Psychiatric:        Mood and Affect: Mood normal.        Behavior: Behavior normal.     Imaging: XR C-ARM NO REPORT  Result Date: 06/17/2021 Please see Notes tab for imaging impression.

## 2021-06-18 NOTE — Procedures (Signed)
Lumbosacral Transforaminal Epidural Steroid Injection - Sub-Pedicular Approach with Fluoroscopic Guidance  Patient: Stefanie Carter      Date of Birth: [birth year] MRN: [id number] PCP: Wenda Low, MD      Visit Date: 06/17/2021   Universal Protocol:    Date/Time: 06/17/2021  Consent Given By: the patient  Position: PRONE  Additional Comments: Vital signs were monitored before and after the procedure. Patient was prepped and draped in the usual sterile fashion. The correct patient, procedure, and site was verified.   Injection Procedure Details:   Procedure diagnoses: Lumbar radiculopathy [M54.16]    Meds Administered:  Meds ordered this encounter  Medications   methylPREDNISolone acetate (DEPO-MEDROL) injection 80 mg    Laterality: Bilateral  Location/Site:  L4-L5  Needle:5.0 in., 22 ga.  Short bevel or Quincke spinal needle  Needle Placement: Transforaminal  Findings:    -Comments: Excellent flow of contrast along the nerve, nerve root and into the epidural space.  Procedure Details: After squaring off the end-plates to get a true AP view, the C-arm was positioned so that an oblique view of the foramen as noted above was visualized. The target area is just inferior to the "nose of the scotty dog" or sub pedicular. The soft tissues overlying this structure were infiltrated with 2-3 ml. of 1% Lidocaine without Epinephrine.  The spinal needle was inserted toward the target using a "trajectory" view along the fluoroscope beam.  Under AP and lateral visualization, the needle was advanced so it did not puncture dura and was located close the 6 O'Clock position of the pedical in AP tracterory. Biplanar projections were used to confirm position. Aspiration was confirmed to be negative for CSF and/or blood. A 1-2 ml. volume of Isovue-250 was injected and flow of contrast was noted at each level. Radiographs were obtained for documentation purposes.   After attaining the  desired flow of contrast documented above, a 0.5 to 1.0 ml test dose of 0.25% Marcaine was injected into each respective transforaminal space.  The patient was observed for 90 seconds post injection.  After no sensory deficits were reported, and normal lower extremity motor function was noted,   the above injectate was administered so that equal amounts of the injectate were placed at each foramen (level) into the transforaminal epidural space.   Additional Comments:  The patient tolerated the procedure well Dressing: 2 x 2 sterile gauze and Band-Aid    Post-procedure details: Patient was observed during the procedure. Post-procedure instructions were reviewed.  Patient left the clinic in stable condition.

## 2021-06-21 DIAGNOSIS — H0100A Unspecified blepharitis right eye, upper and lower eyelids: Secondary | ICD-10-CM | POA: Diagnosis not present

## 2021-06-21 DIAGNOSIS — H353112 Nonexudative age-related macular degeneration, right eye, intermediate dry stage: Secondary | ICD-10-CM | POA: Diagnosis not present

## 2021-06-21 DIAGNOSIS — H18593 Other hereditary corneal dystrophies, bilateral: Secondary | ICD-10-CM | POA: Diagnosis not present

## 2021-06-21 DIAGNOSIS — H524 Presbyopia: Secondary | ICD-10-CM | POA: Diagnosis not present

## 2021-06-26 DIAGNOSIS — M25512 Pain in left shoulder: Secondary | ICD-10-CM | POA: Diagnosis not present

## 2021-06-27 DIAGNOSIS — Z1231 Encounter for screening mammogram for malignant neoplasm of breast: Secondary | ICD-10-CM | POA: Diagnosis not present

## 2021-06-27 DIAGNOSIS — Z780 Asymptomatic menopausal state: Secondary | ICD-10-CM | POA: Diagnosis not present

## 2021-06-27 DIAGNOSIS — M85852 Other specified disorders of bone density and structure, left thigh: Secondary | ICD-10-CM | POA: Diagnosis not present

## 2021-06-27 DIAGNOSIS — M85851 Other specified disorders of bone density and structure, right thigh: Secondary | ICD-10-CM | POA: Diagnosis not present

## 2021-07-04 DIAGNOSIS — H52209 Unspecified astigmatism, unspecified eye: Secondary | ICD-10-CM | POA: Diagnosis not present

## 2021-07-04 DIAGNOSIS — H5203 Hypermetropia, bilateral: Secondary | ICD-10-CM | POA: Diagnosis not present

## 2021-07-04 DIAGNOSIS — H524 Presbyopia: Secondary | ICD-10-CM | POA: Diagnosis not present

## 2021-08-31 DIAGNOSIS — M79672 Pain in left foot: Secondary | ICD-10-CM | POA: Diagnosis not present

## 2021-09-10 DIAGNOSIS — E782 Mixed hyperlipidemia: Secondary | ICD-10-CM | POA: Diagnosis not present

## 2021-09-10 DIAGNOSIS — M1990 Unspecified osteoarthritis, unspecified site: Secondary | ICD-10-CM | POA: Diagnosis not present

## 2021-09-10 DIAGNOSIS — I10 Essential (primary) hypertension: Secondary | ICD-10-CM | POA: Diagnosis not present

## 2021-09-10 DIAGNOSIS — E039 Hypothyroidism, unspecified: Secondary | ICD-10-CM | POA: Diagnosis not present

## 2021-09-10 DIAGNOSIS — J449 Chronic obstructive pulmonary disease, unspecified: Secondary | ICD-10-CM | POA: Diagnosis not present

## 2021-09-10 DIAGNOSIS — K219 Gastro-esophageal reflux disease without esophagitis: Secondary | ICD-10-CM | POA: Diagnosis not present

## 2021-09-10 DIAGNOSIS — F325 Major depressive disorder, single episode, in full remission: Secondary | ICD-10-CM | POA: Diagnosis not present

## 2021-09-10 DIAGNOSIS — M8580 Other specified disorders of bone density and structure, unspecified site: Secondary | ICD-10-CM | POA: Diagnosis not present

## 2021-09-11 ENCOUNTER — Encounter: Payer: Self-pay | Admitting: Podiatry

## 2021-09-11 ENCOUNTER — Ambulatory Visit (INDEPENDENT_AMBULATORY_CARE_PROVIDER_SITE_OTHER): Payer: Medicare HMO

## 2021-09-11 ENCOUNTER — Other Ambulatory Visit: Payer: Self-pay

## 2021-09-11 ENCOUNTER — Ambulatory Visit: Payer: Medicare HMO

## 2021-09-11 ENCOUNTER — Ambulatory Visit: Payer: Medicare HMO | Admitting: Podiatry

## 2021-09-11 DIAGNOSIS — M7751 Other enthesopathy of right foot: Secondary | ICD-10-CM

## 2021-09-11 DIAGNOSIS — M79671 Pain in right foot: Secondary | ICD-10-CM | POA: Diagnosis not present

## 2021-09-11 DIAGNOSIS — M7752 Other enthesopathy of left foot: Secondary | ICD-10-CM

## 2021-09-11 DIAGNOSIS — M79672 Pain in left foot: Secondary | ICD-10-CM

## 2021-09-11 MED ORDER — TRIAMCINOLONE ACETONIDE 10 MG/ML IJ SUSP
20.0000 mg | Freq: Once | INTRAMUSCULAR | Status: AC
Start: 2021-09-11 — End: 2021-09-11
  Administered 2021-09-11: 20 mg

## 2021-09-12 NOTE — Progress Notes (Signed)
Subjective:   Patient ID: Stefanie Carter, female   DOB: 75 y.o.   MRN: [id number]   HPI Patient in relatively poor health who presents with foot pain bilateral forefoot and a tentative diagnosis of the possibility of a fracture of the left foot that she stated is getting much better and is not bothering her.  She has had severe back problems she has no gait stability and cannot wear any form of immobilization at the current time.  Does not smoke currently and is not active   Review of Systems  All other systems reviewed and are negative.      Objective:  Physical Exam Vitals and nursing note reviewed.  Constitutional:      Appearance: She is well-developed.  Pulmonary:     Effort: Pulmonary effort is normal.  Musculoskeletal:        General: Normal range of motion.  Skin:    General: Skin is warm.  Neurological:     Mental Status: She is alert.    Vascular status found to be intact neurologically mildly diminished.  Range of motion subtalar joint is moderately compromised bilateral no crepitus in the joint there is mild edema in the outside of the left foot but no current pain she states that improved dramatically from a fall several weeks ago and she is complaining now of chronic forefoot pain that she states makes it hard for her to put her forefoot on the ground bilateral.  Patient is in poor health and is walking with a walker     Assessment:  Probability of a fall left with inflammatory condition that appears to have improved dramatically with no indications of sequela with chronic inflammation around the third metatarsal phalangeal joint of both feet     Plan:  H&P x-rays reviewed and discussed difficulty of this condition and her inability to tolerate any form of immobilization.  At this point I did do sterile prep and I injected periarticular around the third MPJ bilateral 2 mg Dexasone Kenalog 5 mg Xylocaine to try to reduce inflammation and advised on the continuing of  cushioned type shoes not going barefoot and at this point no form of immobilization could be considered.  This is a very difficult problem given her age and poor health history and I am hoping that what I did will give her a period of relief  X-rays were negative for signs of fracture currently did reveal moderate arthritis osteoporosis bilateral

## 2021-09-30 ENCOUNTER — Telehealth: Payer: Self-pay | Admitting: Physical Medicine and Rehabilitation

## 2021-09-30 NOTE — Telephone Encounter (Signed)
Patient calling requesting appt with Dr. Ernestina Patches for back injections.

## 2021-10-01 ENCOUNTER — Other Ambulatory Visit (HOSPITAL_COMMUNITY): Payer: Self-pay

## 2021-10-01 ENCOUNTER — Telehealth: Payer: Self-pay | Admitting: Physical Medicine and Rehabilitation

## 2021-10-01 MED FILL — Estrogens, Conjugated Tab 0.625 MG: ORAL | 30 days supply | Qty: 30 | Fill #0 | Status: CN

## 2021-10-01 NOTE — Telephone Encounter (Signed)
Pt returned call to [place]. I told pt to listen out for her phone. Pt states if she don't get a call by the end of the day please call first thing in the morning. Pt phone number is [phone]

## 2021-10-01 NOTE — Telephone Encounter (Signed)
Tried calling. No answer. LM advising to return call. Need to discuss prior injection relief per Dr  Kennon Portela note before proceeding with scheduling.

## 2021-10-01 NOTE — Telephone Encounter (Signed)
I called number left, just rings busy.  I called home number, no answer.

## 2021-10-02 ENCOUNTER — Telehealth: Payer: Self-pay | Admitting: Physical Medicine and Rehabilitation

## 2021-10-02 NOTE — Telephone Encounter (Signed)
Patient called. Returning a call to schedule with Dr. Newton.  ?

## 2021-10-07 DIAGNOSIS — E039 Hypothyroidism, unspecified: Secondary | ICD-10-CM | POA: Diagnosis not present

## 2021-10-07 DIAGNOSIS — M8580 Other specified disorders of bone density and structure, unspecified site: Secondary | ICD-10-CM | POA: Diagnosis not present

## 2021-10-07 DIAGNOSIS — I10 Essential (primary) hypertension: Secondary | ICD-10-CM | POA: Diagnosis not present

## 2021-10-07 DIAGNOSIS — K219 Gastro-esophageal reflux disease without esophagitis: Secondary | ICD-10-CM | POA: Diagnosis not present

## 2021-10-07 DIAGNOSIS — E782 Mixed hyperlipidemia: Secondary | ICD-10-CM | POA: Diagnosis not present

## 2021-10-07 DIAGNOSIS — J449 Chronic obstructive pulmonary disease, unspecified: Secondary | ICD-10-CM | POA: Diagnosis not present

## 2021-10-07 DIAGNOSIS — F325 Major depressive disorder, single episode, in full remission: Secondary | ICD-10-CM | POA: Diagnosis not present

## 2021-10-07 DIAGNOSIS — M1990 Unspecified osteoarthritis, unspecified site: Secondary | ICD-10-CM | POA: Diagnosis not present

## 2021-10-31 ENCOUNTER — Ambulatory Visit: Payer: Self-pay

## 2021-10-31 ENCOUNTER — Ambulatory Visit: Payer: Medicare HMO | Admitting: Physical Medicine and Rehabilitation

## 2021-10-31 ENCOUNTER — Other Ambulatory Visit: Payer: Self-pay

## 2021-10-31 ENCOUNTER — Encounter: Payer: Self-pay | Admitting: Physical Medicine and Rehabilitation

## 2021-10-31 VITALS — BP 128/80 | HR 65

## 2021-10-31 DIAGNOSIS — M48062 Spinal stenosis, lumbar region with neurogenic claudication: Secondary | ICD-10-CM

## 2021-10-31 DIAGNOSIS — M5416 Radiculopathy, lumbar region: Secondary | ICD-10-CM | POA: Diagnosis not present

## 2021-10-31 MED ORDER — METHYLPREDNISOLONE ACETATE 80 MG/ML IJ SUSP
80.0000 mg | Freq: Once | INTRAMUSCULAR | Status: AC
  Administered 2021-10-31: 80 mg

## 2021-10-31 NOTE — Patient Instructions (Signed)

## 2021-10-31 NOTE — Progress Notes (Signed)
Pt state lower back pain that travels to her left leg. Pt state walking, standing and laying down makes the pain worse. Pt state she takes over the counter pain meds to help ease her pain. Pt has hx of inj on 06/17/21 pt state it helped with 80% relief.  Numeric Pain Rating Scale and Functional Assessment Average Pain 10   In the last MONTH (on 0-10 scale) has pain interfered with the following?  1. General activity like being  able to carry out your everyday physical activities such as walking, climbing stairs, carrying groceries, or moving a chair?  Rating(10)   +Driver, -BT, -Dye Allergies.

## 2021-11-25 NOTE — Progress Notes (Signed)
Stefanie Carter - 76 y.o. female MRN [id number]  Date of birth: October 17, 1946  Office Visit Note: Visit Date: 10/31/2021 PCP: Wenda Low, MD Referred by: Wenda Low, MD  Subjective: Chief Complaint  Patient presents with   Lower Back - Pain   Left Leg - Pain   HPI:  Stefanie Carter is a 76 y.o. female who comes in today for planned repeat Bilateral L4-5  Lumbar Transforaminal epidural steroid injection with fluoroscopic guidance.  The patient has failed conservative care including home exercise, medications, time and activity modification.  This injection will be diagnostic and hopefully therapeutic.  Please see requesting physician notes for further details and justification. Patient received more than 50% pain relief from prior injection.   Referring: Dr. Rodell Perna   ROS Otherwise per HPI.  Assessment & Plan: Visit Diagnoses:    ICD-10-CM   1. Lumbar radiculopathy  M54.16 XR C-ARM NO REPORT    Epidural Steroid injection    methylPREDNISolone acetate (DEPO-MEDROL) injection 80 mg    2. Spinal stenosis of lumbar region with neurogenic claudication  M48.062 XR C-ARM NO REPORT    Epidural Steroid injection    methylPREDNISolone acetate (DEPO-MEDROL) injection 80 mg      Plan: No additional findings.   Meds & Orders:  Meds ordered this encounter  Medications   methylPREDNISolone acetate (DEPO-MEDROL) injection 80 mg    Orders Placed This Encounter  Procedures   XR C-ARM NO REPORT   Epidural Steroid injection    Follow-up: Return if symptoms worsen or fail to improve.   Procedures: No procedures performed  Lumbosacral Transforaminal Epidural Steroid Injection - Sub-Pedicular Approach with Fluoroscopic Guidance  Patient: Stefanie Carter      Date of Birth: 08/10/1946 MRN: [id number] PCP: Wenda Low, MD      Visit Date: 10/31/2021   Universal Protocol:    Date/Time: 10/31/2021  Consent Given By: the patient  Position: PRONE  Additional  Comments: Vital signs were monitored before and after the procedure. Patient was prepped and draped in the usual sterile fashion. The correct patient, procedure, and site was verified.   Injection Procedure Details:   Procedure diagnoses: Lumbar radiculopathy [M54.16]    Meds Administered:  Meds ordered this encounter  Medications   methylPREDNISolone acetate (DEPO-MEDROL) injection 80 mg    Laterality: Bilateral  Location/Site: L4  Needle:5.0 in., 22 ga.  Short bevel or Quincke spinal needle  Needle Placement: Transforaminal  Findings:    -Comments: Excellent flow of contrast along the nerve, nerve root and into the epidural space.  Procedure Details: After squaring off the end-plates to get a true AP view, the C-arm was positioned so that an oblique view of the foramen as noted above was visualized. The target area is just inferior to the "nose of the scotty dog" or sub pedicular. The soft tissues overlying this structure were infiltrated with 2-3 ml. of 1% Lidocaine without Epinephrine.  The spinal needle was inserted toward the target using a "trajectory" view along the fluoroscope beam.  Under AP and lateral visualization, the needle was advanced so it did not puncture dura and was located close the 6 O'Clock position of the pedical in AP tracterory. Biplanar projections were used to confirm position. Aspiration was confirmed to be negative for CSF and/or blood. A 1-2 ml. volume of Isovue-250 was injected and flow of contrast was noted at each level. Radiographs were obtained for documentation purposes.   After attaining the desired flow of contrast documented above, a 0.5 to  1.0 ml test dose of 0.25% Marcaine was injected into each respective transforaminal space.  The patient was observed for 90 seconds post injection.  After no sensory deficits were reported, and normal lower extremity motor function was noted,   the above injectate was administered so that equal amounts of  the injectate were placed at each foramen (level) into the transforaminal epidural space.   Additional Comments:  The patient tolerated the procedure well Dressing: 2 x 2 sterile gauze and Band-Aid    Post-procedure details: Patient was observed during the procedure. Post-procedure instructions were reviewed.  Patient left the clinic in stable condition.     Clinical History: MRI LUMBAR SPINE WITHOUT CONTRAST   TECHNIQUE: Multiplanar, multisequence MR imaging of the lumbar spine was performed. No intravenous contrast was administered.   COMPARISON:  MRI 06/14/2010.  Radiography 07/12/2020.   FINDINGS: Segmentation: 5 lumbar type vertebral bodies numbered as previous, with L5 being sacralized and transitional.   Alignment: 6 mm anterolisthesis L4-5. This is similar to the study of 2011.   Vertebrae:  No fracture or primary bone lesion.   Conus medullaris and cauda equina: Conus extends to the L1-2 level. Conus and cauda equina appear normal.   Paraspinal and other soft tissues: Negative   Disc levels:   Disc bulges at every level from T8-9 through L1-2. No apparent compressive stenosis of the canal or foramina however.   L2-3: Bulging of the disc. Facet and ligamentous hypertrophy. Mild multifactorial stenosis but without focal neural compression.   L3-4: Mild bulging of the disc. Mild facet and ligamentous prominence. Mild narrowing of the lateral recesses and foramina but without definite neural compression.   L4-5: Chronic facet arthropathy with 6 mm of anterolisthesis. Pseudo disc herniation. Severe stenosis of the canal at and just below the disc level. Neural compression quite likely at this level. Bilateral foraminal stenosis could compress either or both exiting L4 nerves. This stenosis has probably worsened slightly compared the study of 2011.   L5-S1: Transitional level.  No canal or foraminal stenosis.   IMPRESSION: 1. L5 is a sacralized and  transitional vertebra. 2. Severe multifactorial spinal stenosis at the L4-5 level, slightly worsened compared to the study of 2011. Chronic facet arthropathy with 6 mm of anterolisthesis. Pseudo disc herniation. Bilateral foraminal stenosis could compress either or both exiting L4 nerves. 3. Mild multifactorial stenosis at L2-3 and L3-4 but without distinct neural compression.     Electronically Signed   By: Nelson Chimes M.D.   On: 08/04/2020 12:47     Objective:  VS:  HT:     WT:    BMI:      BP:128/80   HR:65bpm   TEMP: ( )   RESP:  Physical Exam Vitals and nursing note reviewed.  Constitutional:      General: She is not in acute distress.    Appearance: Normal appearance. She is not ill-appearing.  HENT:     Head: Normocephalic and atraumatic.     Right Ear: External ear normal.     Left Ear: External ear normal.  Eyes:     Extraocular Movements: Extraocular movements intact.  Cardiovascular:     Rate and Rhythm: Normal rate.     Pulses: Normal pulses.  Pulmonary:     Effort: Pulmonary effort is normal. No respiratory distress.  Abdominal:     General: There is no distension.     Palpations: Abdomen is soft.  Musculoskeletal:        General:  Tenderness present.     Cervical back: Neck supple.     Right lower leg: No edema.     Left lower leg: No edema.     Comments: Patient has good distal strength with no pain over the greater trochanters.  No clonus or focal weakness.  Skin:    Findings: No erythema, lesion or rash.  Neurological:     General: No focal deficit present.     Mental Status: She is alert and oriented to person, place, and time.     Sensory: No sensory deficit.     Motor: No weakness or abnormal muscle tone.     Coordination: Coordination normal.  Psychiatric:        Mood and Affect: Mood normal.        Behavior: Behavior normal.     Imaging: No results found.

## 2021-11-25 NOTE — Procedures (Signed)
Lumbosacral Transforaminal Epidural Steroid Injection - Sub-Pedicular Approach with Fluoroscopic Guidance  Patient: Stefanie Carter      Date of Birth: 12-10-1945 MRN: [id number] PCP: Wenda Low, MD      Visit Date: 10/31/2021   Universal Protocol:    Date/Time: 10/31/2021  Consent Given By: the patient  Position: PRONE  Additional Comments: Vital signs were monitored before and after the procedure. Patient was prepped and draped in the usual sterile fashion. The correct patient, procedure, and site was verified.   Injection Procedure Details:   Procedure diagnoses: Lumbar radiculopathy [M54.16]    Meds Administered:  Meds ordered this encounter  Medications   methylPREDNISolone acetate (DEPO-MEDROL) injection 80 mg    Laterality: Bilateral  Location/Site: L4  Needle:5.0 in., 22 ga.  Short bevel or Quincke spinal needle  Needle Placement: Transforaminal  Findings:    -Comments: Excellent flow of contrast along the nerve, nerve root and into the epidural space.  Procedure Details: After squaring off the end-plates to get a true AP view, the C-arm was positioned so that an oblique view of the foramen as noted above was visualized. The target area is just inferior to the "nose of the scotty dog" or sub pedicular. The soft tissues overlying this structure were infiltrated with 2-3 ml. of 1% Lidocaine without Epinephrine.  The spinal needle was inserted toward the target using a "trajectory" view along the fluoroscope beam.  Under AP and lateral visualization, the needle was advanced so it did not puncture dura and was located close the 6 O'Clock position of the pedical in AP tracterory. Biplanar projections were used to confirm position. Aspiration was confirmed to be negative for CSF and/or blood. A 1-2 ml. volume of Isovue-250 was injected and flow of contrast was noted at each level. Radiographs were obtained for documentation purposes.   After attaining the  desired flow of contrast documented above, a 0.5 to 1.0 ml test dose of 0.25% Marcaine was injected into each respective transforaminal space.  The patient was observed for 90 seconds post injection.  After no sensory deficits were reported, and normal lower extremity motor function was noted,   the above injectate was administered so that equal amounts of the injectate were placed at each foramen (level) into the transforaminal epidural space.   Additional Comments:  The patient tolerated the procedure well Dressing: 2 x 2 sterile gauze and Band-Aid    Post-procedure details: Patient was observed during the procedure. Post-procedure instructions were reviewed.  Patient left the clinic in stable condition.

## 2021-12-11 DIAGNOSIS — K219 Gastro-esophageal reflux disease without esophagitis: Secondary | ICD-10-CM | POA: Diagnosis not present

## 2021-12-11 DIAGNOSIS — J449 Chronic obstructive pulmonary disease, unspecified: Secondary | ICD-10-CM | POA: Diagnosis not present

## 2021-12-11 DIAGNOSIS — M1990 Unspecified osteoarthritis, unspecified site: Secondary | ICD-10-CM | POA: Diagnosis not present

## 2021-12-11 DIAGNOSIS — F419 Anxiety disorder, unspecified: Secondary | ICD-10-CM | POA: Diagnosis not present

## 2021-12-11 DIAGNOSIS — R7303 Prediabetes: Secondary | ICD-10-CM | POA: Diagnosis not present

## 2021-12-11 DIAGNOSIS — I10 Essential (primary) hypertension: Secondary | ICD-10-CM | POA: Diagnosis not present

## 2021-12-11 DIAGNOSIS — M519 Unspecified thoracic, thoracolumbar and lumbosacral intervertebral disc disorder: Secondary | ICD-10-CM | POA: Diagnosis not present

## 2021-12-11 DIAGNOSIS — E782 Mixed hyperlipidemia: Secondary | ICD-10-CM | POA: Diagnosis not present

## 2021-12-11 DIAGNOSIS — E039 Hypothyroidism, unspecified: Secondary | ICD-10-CM | POA: Diagnosis not present

## 2021-12-31 DIAGNOSIS — M542 Cervicalgia: Secondary | ICD-10-CM | POA: Diagnosis not present

## 2021-12-31 DIAGNOSIS — M79602 Pain in left arm: Secondary | ICD-10-CM | POA: Diagnosis not present

## 2022-01-02 ENCOUNTER — Emergency Department (HOSPITAL_BASED_OUTPATIENT_CLINIC_OR_DEPARTMENT_OTHER): Payer: Medicare HMO

## 2022-01-02 ENCOUNTER — Encounter (HOSPITAL_BASED_OUTPATIENT_CLINIC_OR_DEPARTMENT_OTHER): Payer: Self-pay | Admitting: Emergency Medicine

## 2022-01-02 ENCOUNTER — Emergency Department (HOSPITAL_BASED_OUTPATIENT_CLINIC_OR_DEPARTMENT_OTHER)
Admission: EM | Admit: 2022-01-02 | Discharge: 2022-01-02 | Disposition: A | Discharge status: Home / Self Care | Payer: Medicare HMO | Attending: Emergency Medicine | Admitting: Emergency Medicine

## 2022-01-02 ENCOUNTER — Other Ambulatory Visit: Payer: Self-pay

## 2022-01-02 DIAGNOSIS — E119 Type 2 diabetes mellitus without complications: Secondary | ICD-10-CM | POA: Insufficient documentation

## 2022-01-02 DIAGNOSIS — E782 Mixed hyperlipidemia: Secondary | ICD-10-CM | POA: Diagnosis not present

## 2022-01-02 DIAGNOSIS — K5900 Constipation, unspecified: Secondary | ICD-10-CM | POA: Insufficient documentation

## 2022-01-02 DIAGNOSIS — M791 Myalgia, unspecified site: Secondary | ICD-10-CM | POA: Insufficient documentation

## 2022-01-02 DIAGNOSIS — J984 Other disorders of lung: Secondary | ICD-10-CM | POA: Diagnosis not present

## 2022-01-02 DIAGNOSIS — J439 Emphysema, unspecified: Secondary | ICD-10-CM | POA: Diagnosis not present

## 2022-01-02 DIAGNOSIS — I700 Atherosclerosis of aorta: Secondary | ICD-10-CM | POA: Diagnosis not present

## 2022-01-02 DIAGNOSIS — K219 Gastro-esophageal reflux disease without esophagitis: Secondary | ICD-10-CM | POA: Diagnosis not present

## 2022-01-02 DIAGNOSIS — E039 Hypothyroidism, unspecified: Secondary | ICD-10-CM | POA: Diagnosis not present

## 2022-01-02 DIAGNOSIS — R911 Solitary pulmonary nodule: Secondary | ICD-10-CM | POA: Diagnosis not present

## 2022-01-02 DIAGNOSIS — K529 Noninfective gastroenteritis and colitis, unspecified: Secondary | ICD-10-CM | POA: Diagnosis not present

## 2022-01-02 DIAGNOSIS — I10 Essential (primary) hypertension: Secondary | ICD-10-CM | POA: Diagnosis not present

## 2022-01-02 DIAGNOSIS — E876 Hypokalemia: Secondary | ICD-10-CM | POA: Insufficient documentation

## 2022-01-02 DIAGNOSIS — R059 Cough, unspecified: Secondary | ICD-10-CM | POA: Diagnosis not present

## 2022-01-02 DIAGNOSIS — D259 Leiomyoma of uterus, unspecified: Secondary | ICD-10-CM | POA: Diagnosis not present

## 2022-01-02 DIAGNOSIS — Z20822 Contact with and (suspected) exposure to covid-19: Secondary | ICD-10-CM | POA: Diagnosis not present

## 2022-01-02 DIAGNOSIS — R031 Nonspecific low blood-pressure reading: Secondary | ICD-10-CM | POA: Diagnosis not present

## 2022-01-02 DIAGNOSIS — R350 Frequency of micturition: Secondary | ICD-10-CM | POA: Diagnosis present

## 2022-01-02 DIAGNOSIS — K760 Fatty (change of) liver, not elsewhere classified: Secondary | ICD-10-CM | POA: Diagnosis not present

## 2022-01-02 DIAGNOSIS — K8689 Other specified diseases of pancreas: Secondary | ICD-10-CM | POA: Diagnosis not present

## 2022-01-02 DIAGNOSIS — R0689 Other abnormalities of breathing: Secondary | ICD-10-CM | POA: Insufficient documentation

## 2022-01-02 LAB — CBC WITH DIFFERENTIAL/PLATELET
Abs Immature Granulocytes: 0.1 10*3/uL — ABNORMAL HIGH (ref 0.00–0.07)
Basophils Absolute: 0.1 10*3/uL (ref 0.0–0.1)
Basophils Relative: 0 %
Eosinophils Absolute: 0.1 10*3/uL (ref 0.0–0.5)
Eosinophils Relative: 0 %
HCT: 39.2 % (ref 36.0–46.0)
Hemoglobin: 13.5 g/dL (ref 12.0–15.0)
Immature Granulocytes: 1 %
Lymphocytes Relative: 4 %
Lymphs Abs: 0.7 10*3/uL (ref 0.7–4.0)
MCH: 31 pg (ref 26.0–34.0)
MCHC: 34.4 g/dL (ref 30.0–36.0)
MCV: 90.1 fL (ref 80.0–100.0)
Monocytes Absolute: 2.3 10*3/uL — ABNORMAL HIGH (ref 0.1–1.0)
Monocytes Relative: 13 %
Neutro Abs: 14.1 10*3/uL — ABNORMAL HIGH (ref 1.7–7.7)
Neutrophils Relative %: 82 %
Platelets: 220 10*3/uL (ref 150–400)
RBC: 4.35 MIL/uL (ref 3.87–5.11)
RDW: 12 % (ref 11.5–15.5)
WBC: 17.3 10*3/uL — ABNORMAL HIGH (ref 4.0–10.5)
nRBC: 0 % (ref 0.0–0.2)

## 2022-01-02 LAB — URINALYSIS, MICROSCOPIC (REFLEX): Bacteria, UA: NONE SEEN

## 2022-01-02 LAB — COMPREHENSIVE METABOLIC PANEL
ALT: 97 U/L — ABNORMAL HIGH (ref 0–44)
AST: 75 U/L — ABNORMAL HIGH (ref 15–41)
Albumin: 3.9 g/dL (ref 3.5–5.0)
Alkaline Phosphatase: 101 U/L (ref 38–126)
Anion gap: 12 (ref 5–15)
BUN: 28 mg/dL — ABNORMAL HIGH (ref 8–23)
CO2: 26 mmol/L (ref 22–32)
Calcium: 9.8 mg/dL (ref 8.9–10.3)
Chloride: 96 mmol/L — ABNORMAL LOW (ref 98–111)
Creatinine, Ser: 1.02 mg/dL — ABNORMAL HIGH (ref 0.44–1.00)
GFR, Estimated: 58 mL/min — ABNORMAL LOW (ref >60)
Glucose, Bld: 109 mg/dL — ABNORMAL HIGH (ref 70–99)
Potassium: 3.1 mmol/L — ABNORMAL LOW (ref 3.5–5.1)
Sodium: 134 mmol/L — ABNORMAL LOW (ref 135–145)
Total Bilirubin: 0.8 mg/dL (ref 0.3–1.2)
Total Protein: 6.9 g/dL (ref 6.5–8.1)

## 2022-01-02 LAB — URINALYSIS, ROUTINE W REFLEX MICROSCOPIC
Bilirubin Urine: NEGATIVE
Glucose, UA: NEGATIVE mg/dL
Hgb urine dipstick: NEGATIVE
Ketones, ur: NEGATIVE mg/dL
Leukocytes,Ua: NEGATIVE
Nitrite: NEGATIVE
Protein, ur: 30 mg/dL — AB
Specific Gravity, Urine: 1.026 (ref 1.005–1.030)
pH: 5.5 (ref 5.0–8.0)

## 2022-01-02 LAB — MAGNESIUM: Magnesium: 1.7 mg/dL (ref 1.7–2.4)

## 2022-01-02 LAB — TROPONIN I (HIGH SENSITIVITY): Troponin I (High Sensitivity): 21 ng/L — ABNORMAL HIGH (ref <18)

## 2022-01-02 LAB — CBG MONITORING, ED: Glucose-Capillary: 121 mg/dL — ABNORMAL HIGH (ref 70–99)

## 2022-01-02 LAB — RESP PANEL BY RT-PCR (FLU A&B, COVID) ARPGX2
Influenza A by PCR: NEGATIVE
Influenza B by PCR: NEGATIVE
SARS Coronavirus 2 by RT PCR: NEGATIVE

## 2022-01-02 LAB — LIPASE, BLOOD: Lipase: 10 U/L — ABNORMAL LOW (ref 11–51)

## 2022-01-02 MED ORDER — IOHEXOL 300 MG/ML  SOLN
100.0000 mL | Freq: Once | INTRAMUSCULAR | Status: AC | PRN
  Administered 2022-01-02: 22:00:00 75 mL via INTRAVENOUS

## 2022-01-02 MED ORDER — SENNOSIDES-DOCUSATE SODIUM 8.6-50 MG PO TABS: 1.0000 | ORAL_TABLET | Freq: Every day | ORAL | 0 refills | Status: DC | PRN

## 2022-01-02 MED ORDER — METRONIDAZOLE 500 MG PO TABS
500.0000 mg | ORAL_TABLET | Freq: Once | ORAL | Status: AC
  Administered 2022-01-02: 23:00:00 500 mg via ORAL
  Filled 2022-01-02: qty 1

## 2022-01-02 MED ORDER — CIPROFLOXACIN HCL 500 MG PO TABS
500.0000 mg | ORAL_TABLET | Freq: Once | ORAL | Status: AC
  Administered 2022-01-02: 23:00:00 500 mg via ORAL
  Filled 2022-01-02: qty 1

## 2022-01-02 MED ORDER — POTASSIUM CHLORIDE ER 10 MEQ PO TBCR: 10.0000 meq | EXTENDED_RELEASE_TABLET | Freq: Every day | ORAL | 0 refills | Status: DC

## 2022-01-02 MED ORDER — SODIUM CHLORIDE 0.9 % IV BOLUS
1000.0000 mL | Freq: Once | INTRAVENOUS | Status: AC
  Administered 2022-01-02: 19:00:00 1000 mL via INTRAVENOUS

## 2022-01-02 MED ORDER — METRONIDAZOLE 500 MG PO TABS: 500.0000 mg | ORAL_TABLET | Freq: Three times a day (TID) | ORAL | 0 refills | Status: AC

## 2022-01-02 MED ORDER — CIPROFLOXACIN HCL 500 MG PO TABS: 500.0000 mg | ORAL_TABLET | Freq: Two times a day (BID) | ORAL | 0 refills | Status: AC

## 2022-01-02 NOTE — ED Triage Notes (Signed)
Pt arrives to ED with c/o hypotension, generalized body aches and hematuria for 5-7 days. Associated symptoms include chills, cough, malaise, fatigue.  ?

## 2022-01-02 NOTE — Discharge Instructions (Addendum)
In the ER today you had blood tests, a COVID swab, and a CT scan.  Your COVID and flu swabs were negative.  Your blood test showed that you had a high white blood cell count.  This could be a sign of inflammation.  On your CT scan we found signs of bowel thickening and inflammation called colitis, along with constipation.  I recommended that you take antibiotics for a week to try to clear up this infection.  I also prescribed you a stool softener and laxative called Peri-Colace which she can take once a day to help with your bowel movements. ? ?Finally, your blood work showed that your potassium level was a little low today at 3.1.  I prescribed oral potassium pills which you should take for the next 20 days.  Please schedule follow-up appoint with your doctor in the office this week to have a recheck of your symptoms. ? ?Please do NOT take any steroids at this time. ?

## 2022-01-02 NOTE — ED Provider Notes (Signed)
[facility] Provider Note   CSN: [id number] Arrival date & time: 01/02/22  1757     History  Chief Complaint  Patient presents with   Generalized Body Aches    Stefanie Carter is a 76 y.o. female with a history of diabetes on Januvia presenting to ED with multiple complaints.  The patient reports that she has had generalized body aches, generalized fatigue, urinary frequency and excessive thirst for the past week.  She reports fevers, cough, chills, malaise.  She said her doctor told to come to the ER to get evaluated.   HPI     Home Medications Prior to Admission medications   Medication Sig Start Date End Date Taking? Authorizing Provider  ciprofloxacin (CIPRO) 500 MG tablet Take 1 tablet (500 mg total) by mouth every 12 (twelve) hours for 7 days. 01/03/22 01/10/22 Yes Chela Sutphen, Carola Rhine, MD  metroNIDAZOLE (FLAGYL) 500 MG tablet Take 1 tablet (500 mg total) by mouth 3 (three) times daily for 7 days. 01/03/22 01/10/22 Yes Yulisa Chirico, Carola Rhine, MD  potassium chloride (KLOR-CON) 10 MEQ tablet Take 1 tablet (10 mEq total) by mouth daily for 15 doses. 01/03/22 01/18/22 Yes Rahkim Rabalais, Carola Rhine, MD  senna-docusate (SENOKOT-S) 8.6-50 MG tablet Take 1 tablet by mouth daily as needed for up to 15 doses for mild constipation. 01/02/22  Yes Wyvonnia Dusky, MD  albuterol (VENTOLIN HFA) 108 (90 Base) MCG/ACT inhaler Inhale into the lungs.    [provider]  estrogens, conjugated, (PREMARIN) 0.625 MG tablet TAKE 1 TABLET BY MOUTH AS NEEDED 11/12/20 11/12/21  Drema Dallas, DO  estrogens, conjugated, (PREMARIN) 0.625 MG tablet TAKE 1 TABLET BY MOUTH ONCE A DAY 11/08/20 11/08/21  Drema Dallas, DO  famotidine (PEPCID) 40 MG tablet  06/11/21   [provider]  hydrochlorothiazide (MICROZIDE) 12.5 MG capsule Take 12.5 mg by mouth daily.    [provider]  levothyroxine (SYNTHROID) 125 MCG tablet  08/05/20   [provider]  lidocaine  (XYLOCAINE) 2 % solution Swish and Spit out 6m  by mouth 3 times a day 04/22/21     loratadine (CLARITIN) 10 MG tablet 1 tablet    [provider]  LORazepam (ATIVAN) 0.5 MG tablet Take 1 tablet by mouth 3 times a day as needed 03/11/21     metoprolol tartrate (LOPRESSOR) 25 MG tablet  05/18/20   [provider]  MULTIPLE VITAMIN PO Take by mouth.    [provider]  potassium chloride SA (KLOR-CON) 20 MEQ tablet  06/11/21   [provider]  PRAVASTATIN SODIUM PO Take 80 mg by mouth.     [provider]  PREMARIN 0.3 MG tablet  05/21/20   [provider]  pseudoephedrine (SUDAFED) 30 MG tablet 1 tablets as needed    [provider]  sertraline (ZOLOFT) 100 MG tablet  06/11/21   [provider]      Allergies    Penicillins, Metformin hcl, Sulfa antibiotics, Sulfamethoxazole-trimethoprim, Varenicline, and Codeine    Review of Systems   Review of Systems  Physical Exam Updated Vital Signs BP 110/70 (BP Location: Right Arm)    Pulse 98    Temp 98.8 F (37.1 C)    Resp 18    Ht 5' (1.524 m)    Wt 50.4 kg    SpO2 94%    BMI 21.72 kg/m  Physical Exam Constitutional:      General: She is not in acute distress. HENT:  Head: Normocephalic and atraumatic.  Eyes:     Conjunctiva/sclera: Conjunctivae normal.     Pupils: Pupils are equal, round, and reactive to light.  Cardiovascular:     Rate and Rhythm: Normal rate and regular rhythm.     Pulses: Normal pulses.  Pulmonary:     Effort: Pulmonary effort is normal. No respiratory distress.  Abdominal:     General: There is no distension.     Tenderness: There is no abdominal tenderness.  Skin:    General: Skin is warm and dry.  Neurological:     General: No focal deficit present.     Mental Status: She is alert. Mental status is at baseline.    ED Results / Procedures / Treatments   Labs (all labs ordered are listed, but only abnormal results are displayed) Labs  Reviewed  COMPREHENSIVE METABOLIC PANEL - Abnormal; Notable for the following components:      Result Value   Sodium 134 (*)    Potassium 3.1 (*)    Chloride 96 (*)    Glucose, Bld 109 (*)    BUN 28 (*)    Creatinine, Ser 1.02 (*)    AST 75 (*)    ALT 97 (*)    GFR, Estimated 58 (*)    All other components within normal limits  CBC WITH DIFFERENTIAL/PLATELET - Abnormal; Notable for the following components:   WBC 17.3 (*)    Neutro Abs 14.1 (*)    Monocytes Absolute 2.3 (*)    Abs Immature Granulocytes 0.10 (*)    All other components within normal limits  URINALYSIS, ROUTINE W REFLEX MICROSCOPIC - Abnormal; Notable for the following components:   Protein, ur 30 (*)    All other components within normal limits  LIPASE, BLOOD - Abnormal; Notable for the following components:   Lipase 10 (*)    All other components within normal limits  CBG MONITORING, ED - Abnormal; Notable for the following components:   Glucose-Capillary 121 (*)    All other components within normal limits  TROPONIN I (HIGH SENSITIVITY) - Abnormal; Notable for the following components:   Troponin I (High Sensitivity) 21 (*)    All other components within normal limits  RESP PANEL BY RT-PCR (FLU A&B, COVID) [id number]  URINE CULTURE  MAGNESIUM  URINALYSIS, MICROSCOPIC (REFLEX)    EKG None  Radiology CT CHEST ABDOMEN PELVIS W CONTRAST  Result Date: 01/02/2022 CLINICAL DATA:  Cough, abdominal pain, diarrhea.  Sepsis evaluation. EXAM: CT CHEST, ABDOMEN, AND PELVIS WITH CONTRAST TECHNIQUE: Multidetector CT imaging of the chest, abdomen and pelvis was performed following the standard protocol during bolus administration of intravenous contrast. RADIATION DOSE REDUCTION: This exam was performed according to the departmental dose-optimization program which includes automated exposure control, adjustment of the mA and/or kV according to patient size and/or use of iterative reconstruction technique. CONTRAST:  18m  OMNIPAQUE IOHEXOL 300 MG/ML  SOLN COMPARISON:  Chest radiograph earlier today FINDINGS: CT CHEST FINDINGS Cardiovascular: Moderate aortic atherosclerosis. No aortic aneurysm or acute aortic findings. Coronary artery and mitral annulus calcifications. No central pulmonary embolus to the segmental level. Normal heart size. No pericardial effusion. Mediastinum/Nodes: No enlarged mediastinal or hilar lymph nodes. No thyroid nodule. Minimal distal esophageal wall thickening. Lungs/Pleura: Mild emphysema. Bandlike atelectasis in the left greater than right lower lobe. Mild central bronchial thickening with occasional mucous plugging. No pneumonia or acute airspace consolidation. There is faint subpleural reticulation in both lungs. Tiny 2 mm right apical pulmonary nodule, series 5,  image 35. Trachea and central bronchi are patent. No pleural fluid. No features of pulmonary edema. Musculoskeletal: Diffuse thoracic spondylosis with spurring. There are no acute or suspicious osseous abnormalities. No chest wall soft tissue abnormalities. CT ABDOMEN PELVIS FINDINGS Hepatobiliary: Mild subjective hepatic steatosis. There is no focal liver lesion. No intrahepatic biliary ductal dilatation. Gallbladder is distended. Upper normal common bile duct at 7-8 mm. No visualized gallstones. There is a questionable abrupt cutoff of the distal common bile duct, coronal series 6, image 53. Pancreas: No pancreatic inflammation. Minimally dilated proximal pancreatic duct of 4 mm. No evidence of pancreatic mass. There is mild parenchymal atrophy. Spleen: Normal in size without focal abnormality. Adrenals/Urinary Tract: Normal adrenal glands. No hydronephrosis or perinephric edema. Homogeneous renal enhancement with symmetric excretion on delayed phase imaging. No visualized renal stone or focal renal lesion. Urinary bladder is physiologically distended without wall thickening. Stomach/Bowel: Bowel evaluation is limited in the absence of  enteric contrast and paucity of intra-abdominal fat. The stomach is nondistended. No small bowel obstruction or inflammation. Appendix is not seen, history of appendectomy s surgically absent not there is fluid/liquid stool in the cecum and ascending colon. Equivocal wall thickening of the ascending colon. There is formed stool throughout the remainder of the colon, moderate stool burden. No significant diverticular disease. Sigmoid colon is redundant. Vascular/Lymphatic: Advanced aortic atherosclerosis. No aortic aneurysm. Moderate bi-iliac atherosclerosis. Patent portal and splenic veins. No enlarged lymph nodes in the abdomen or pelvis Reproductive: Calcified uterine fibroids. Pessary in place. The ovaries are not well seen, no suspicious adnexal mass. Other: No ascites.  No free air or focal fluid collection. Musculoskeletal: Diffuse lumbar spondylosis. Grade 1 anterolisthesis of L4 on L5 is likely facet mediated. L5 is transitional. There are no acute or suspicious osseous abnormalities. No abdominal wall soft tissue abnormalities. IMPRESSION: 1. Fluid/liquid stool in the cecum and ascending colon, suggesting diarrheal illness. Equivocal ascending colonic wall thickening, may represent colitis in the appropriate clinical setting. 2. Upper normal common bile duct with questionable abrupt cutoff at the duodenal insertion. Proximal pancreatic duct is also mildly dilated. Recommend correlation with LFTs. Consider further evaluation with MRCP. MRCP should only be pursued if patient is able to tolerate breath hold technique. 3. Tiny 2 mm right apical pulmonary nodule. No further follow-up is recommended in this patient has significant risk factors. 4. No evidence of pneumonia. 5. Mild hepatic steatosis. 6. Calcified uterine fibroids. 7. Mild emphysema. Aortic Atherosclerosis (ICD10-I70.0) and Emphysema (ICD10-J43.9). Electronically Signed   By: Keith Rake M.D.   On: 01/02/2022 22:28   DG Chest Portable 1  View  Result Date: 01/02/2022 CLINICAL DATA:  Persistent cough EXAM: PORTABLE CHEST 1 VIEW COMPARISON:  None. FINDINGS: Mild diffuse slightly reticular opacity suggestive of chronic interstitial lung disease. No focal opacity, pleural effusion or pneumothorax. Probable nipple shadows over the lower chest. Normal heart size. Aortic atherosclerosis. IMPRESSION: No active disease. Suspect that there is a component of chronic interstitial lung disease Electronically Signed   By: Donavan Foil M.D.   On: 01/02/2022 19:26    Procedures Procedures    Medications Ordered in ED Medications  sodium chloride 0.9 % bolus 1,000 mL (0 mLs Intravenous Stopped 01/02/22 2249)  iohexol (OMNIPAQUE) 300 MG/ML solution 100 mL (75 mLs Intravenous Contrast Given 01/02/22 2202)  ciprofloxacin (CIPRO) tablet 500 mg (500 mg Oral Given 01/02/22 2246)  metroNIDAZOLE (FLAGYL) tablet 500 mg (500 mg Oral Given 01/02/22 2246)    ED Course/ Medical Decision Making/ A&P Clinical Course  as of 01/02/22 2345  Thu Jan 02, 2022  2031 SARS Coronavirus 2 by RT PCR: NEGATIVE [MT]  2031 Influenza A By PCR: NEGATIVE [MT]  2201 Patient reassessed.  She reports that her blood pressure is always in this range, this is not low for her.  We discussed CT imaging because of her leukocytosis and her generalized fatigue, now she reports that she has been noticing some epigastric discomfort for a while, and I wonder whether this may be related to colitis.  With her cough I think pneumonia is also reasonable to rule out.  I ordered a CT scan of the chest abdomen pelvis. [MT]  2238 Patient's work-up is consistent with colitis or enterocolitis.  With her leukocytosis I think is reasonable to treat with antibiotics.  She prefers to go home.  We will start her on ciprofloxacin and Flagyl here. [MT]    Clinical Course User Index [MT] Vincenzina Jagoda, Carola Rhine, MD                           Medical Decision Making Amount and/or Complexity of Data Reviewed Labs:  ordered. Decision-making details documented in ED Course. Radiology: ordered.  Risk OTC drugs. Prescription drug management.   This patient presents to the ED with concern for generalized aches and pains, abdominal queasiness and pain.. This involves an extensive number of treatment options, and is a complaint that carries with it a high risk of complications and morbidity.  The differential diagnosis includes infection versus anemia versus dehydration versus other  I ordered and personally interpreted labs.  The pertinent results include: Leukocytosis, lipase within normal limits, mild hypokalemia which appears chronic per her medical records; UA without sign of infection  I ordered imaging studies including CT chest abdomen pelvis, x-ray chest I independently visualized and interpreted imaging which showed thickening of the bowels consistent with colitis, some biliary dilatation but no significant transaminitis or right upper quadrant symptoms suggest biliary obstruction. I agree with the radiologist interpretation  The patient was maintained on a cardiac monitor.  I personally viewed and interpreted the cardiac monitored which showed an underlying rhythm of:  Sinus rhythm  I ordered medication including IV fluids initially for suspected hypotension, patient later clarified that her blood pressure always runs this low.  Ciprofloxacin and Flagyl ordered for colitis.  I have reviewed the patients home medicines and have made adjustments as needed  After the interventions noted above, I reevaluated the patient and found that they have: improved   Dispostion:  After consideration of the diagnostic results and the patients response to treatment, I feel that the patent would benefit from outpatient PCP follow-up for suspected colitis.  Lower suspicion for sepsis clinically based on her work-up and presentation         Final Clinical Impression(s) / ED Diagnoses Final diagnoses:   Colitis  Hypokalemia  Constipation, unspecified constipation type    Rx / DC Orders ED Discharge Orders          Ordered    ciprofloxacin (CIPRO) 500 MG tablet  Every 12 hours        01/02/22 2246    metroNIDAZOLE (FLAGYL) 500 MG tablet  3 times daily        01/02/22 2246    potassium chloride (KLOR-CON) 10 MEQ tablet  Daily        01/02/22 2246    senna-docusate (SENOKOT-S) 8.6-50 MG tablet  Daily PRN  01/02/22 2248              Wyvonnia Dusky, MD 01/02/22 [phone]

## 2022-01-04 LAB — URINE CULTURE: Culture: NO GROWTH

## 2022-01-06 DIAGNOSIS — K529 Noninfective gastroenteritis and colitis, unspecified: Secondary | ICD-10-CM | POA: Diagnosis not present

## 2022-01-06 DIAGNOSIS — E876 Hypokalemia: Secondary | ICD-10-CM | POA: Diagnosis not present

## 2022-01-06 DIAGNOSIS — R300 Dysuria: Secondary | ICD-10-CM | POA: Diagnosis not present

## 2022-01-23 DIAGNOSIS — I959 Hypotension, unspecified: Secondary | ICD-10-CM | POA: Diagnosis not present

## 2022-01-23 DIAGNOSIS — R1011 Right upper quadrant pain: Secondary | ICD-10-CM | POA: Diagnosis not present

## 2022-01-23 DIAGNOSIS — I10 Essential (primary) hypertension: Secondary | ICD-10-CM | POA: Diagnosis not present

## 2022-01-23 DIAGNOSIS — R945 Abnormal results of liver function studies: Secondary | ICD-10-CM | POA: Diagnosis not present

## 2022-01-23 DIAGNOSIS — J449 Chronic obstructive pulmonary disease, unspecified: Secondary | ICD-10-CM | POA: Diagnosis not present

## 2022-01-23 DIAGNOSIS — I700 Atherosclerosis of aorta: Secondary | ICD-10-CM | POA: Diagnosis not present

## 2022-01-24 ENCOUNTER — Other Ambulatory Visit: Payer: Self-pay | Admitting: Internal Medicine

## 2022-01-24 DIAGNOSIS — R7989 Other specified abnormal findings of blood chemistry: Secondary | ICD-10-CM

## 2022-01-27 ENCOUNTER — Other Ambulatory Visit: Payer: Medicare HMO

## 2022-01-29 ENCOUNTER — Telehealth: Payer: Self-pay | Admitting: Obstetrics and Gynecology

## 2022-01-30 NOTE — Telephone Encounter (Signed)
Spoke to [employer] from [employer]. Pt needs a f/u appt. ?Attempt made to contact the patient w/o success. ?LM on the VM for the patient to call back. ?

## 2022-01-30 NOTE — Telephone Encounter (Signed)
Called and spoke to the patient. I asked the pt if she still has her pessary in. Pt said yes, she said she cleans her pessary herself. Pt was notified to schedule an in person f/u appt so we can renew her cath supplies. Pt said she can not come in this month and will call back to schedule. ?

## 2022-03-10 NOTE — Progress Notes (Signed)
[facility] ?Return Visit ? ?SUBJECTIVE  ?History of Present Illness: ?Stefanie Carter is a 76 y.o. female seen in follow-up for neurogenic bladder.  ?Last visit she had a 2-1/2 in [place] pessary placed for prolapse. Has been able to catheterize better with the pessary in place.  ? ?She has not been seen in over a year- we discussed that she needs regular pessary maintenance. She reports that she has not had any problems with the pessary. Denies vaginal bleeding, pain or discharge.  ? ?She is self-catheterizing up to 20 times a day. She wants to make sure her bladder is empty because she does not like leaking. Her leakage is a lot less overall than it was a year ago. Leaks with walking/ movement. Feels she never has bladder sensation of filling or urgency. Denies dysuria.  ? ?Recently diagnosed with diabetes.  ? ?Past Medical History: ?Patient  has a past medical history of Anxiety, Diabetes ([place]), HLD (hyperlipidemia), HTN (hypertension), Hypothyroid, and Spinal stenosis.  ? ?Past Surgical History: ?She  has a past surgical history that includes Appendectomy; Tonsillectomy; and Salivary gland surgery.  ? ?Medications: ?She has a current medication list which includes the following prescription(s): co q10, famotidine, levothyroxine, multiple vitamin, pravastatin sodium, premarin, pseudoephedrine, sertraline, sitagliptin, and cholecalciferol.  ? ?Allergies: ?Patient is allergic to penicillins, metformin hcl, sulfa antibiotics, sulfamethoxazole-trimethoprim, varenicline, and codeine.  ? ?Social History: ?Patient  reports that she quit smoking about 10 years ago. Her smoking use included cigarettes. She has a 30.00 pack-year smoking history. She has never used smokeless tobacco. She reports current alcohol use. She reports that she does not use drugs.  ?  ?  ?OBJECTIVE  ?  ? ?Physical Exam: ?There were no vitals filed for this visit. ? ?Gen: No apparent distress, A&O x 3. ? ?Detailed Urogynecologic  Evaluation:  ?Normal external genitalia ? ?Pessary was noted to be in place. Cervix was noted to be partially through the hole in the center of the pessary- needed to be pushed out to remove pessary. Pessary removed and cleaned. Speculum exam showed no lesions or bleeding in the vagina. Pessary replaced and was comfortable.  ?  ? ?ASSESSMENT AND PLAN  ?  ?[name] is a 76 y.o. with:  ?1. Neurogenic bladder, flaccid   ?2. Uterovaginal prolapse, incomplete   ?3. Prolapse of anterior vaginal wall   ?4. SUI (stress urinary incontinence, female)   ? ?  ? ?Neurogenic bladder ?- We discussed that she does not need to be catheterizing every hour but instead should aim for 4-7 times per day.  ?- Will send for renewal of cath supplies.  ? ?Prolapse ?- Continue 2-1/2 in shaatz pessary.  ?- She will come once every 4 months for pessary cleanings.  ? ?Return 4 months for pessary check ? ?Stefanie Folds, MD ? ?Time spent: I spent 25 minutes dedicated to the care of this patient on the date of this encounter to include pre-visit review of records, face-to-face time with the patient  and post visit documentation. Additional time was spent for procedure.  ? ? ?

## 2022-03-11 ENCOUNTER — Encounter: Payer: Self-pay | Admitting: Obstetrics and Gynecology

## 2022-03-11 ENCOUNTER — Ambulatory Visit: Payer: Medicare HMO | Admitting: Obstetrics and Gynecology

## 2022-03-11 DIAGNOSIS — N393 Stress incontinence (female) (male): Secondary | ICD-10-CM

## 2022-03-11 DIAGNOSIS — N312 Flaccid neuropathic bladder, not elsewhere classified: Secondary | ICD-10-CM

## 2022-03-11 DIAGNOSIS — N8110 Cystocele, unspecified: Secondary | ICD-10-CM | POA: Diagnosis not present

## 2022-03-11 DIAGNOSIS — N812 Incomplete uterovaginal prolapse: Secondary | ICD-10-CM

## 2022-03-11 NOTE — Progress Notes (Signed)
Visit notes, insurance card and order form was faxed to [facility] at [phone]. ? ?Order for 14 fr intermittent catheters qty: 210 per month. ?

## 2022-03-26 DIAGNOSIS — R7303 Prediabetes: Secondary | ICD-10-CM | POA: Diagnosis not present

## 2022-03-26 DIAGNOSIS — I700 Atherosclerosis of aorta: Secondary | ICD-10-CM | POA: Diagnosis not present

## 2022-03-26 DIAGNOSIS — J449 Chronic obstructive pulmonary disease, unspecified: Secondary | ICD-10-CM | POA: Diagnosis not present

## 2022-03-26 DIAGNOSIS — I10 Essential (primary) hypertension: Secondary | ICD-10-CM | POA: Diagnosis not present

## 2022-03-26 DIAGNOSIS — F331 Major depressive disorder, recurrent, moderate: Secondary | ICD-10-CM | POA: Diagnosis not present

## 2022-03-26 DIAGNOSIS — E039 Hypothyroidism, unspecified: Secondary | ICD-10-CM | POA: Diagnosis not present

## 2022-03-26 DIAGNOSIS — G2581 Restless legs syndrome: Secondary | ICD-10-CM | POA: Diagnosis not present

## 2022-03-26 DIAGNOSIS — F419 Anxiety disorder, unspecified: Secondary | ICD-10-CM | POA: Diagnosis not present

## 2022-04-07 DIAGNOSIS — E119 Type 2 diabetes mellitus without complications: Secondary | ICD-10-CM | POA: Diagnosis not present

## 2022-04-07 DIAGNOSIS — H43393 Other vitreous opacities, bilateral: Secondary | ICD-10-CM | POA: Diagnosis not present

## 2022-04-19 DIAGNOSIS — H52209 Unspecified astigmatism, unspecified eye: Secondary | ICD-10-CM | POA: Diagnosis not present

## 2022-04-19 DIAGNOSIS — H5203 Hypermetropia, bilateral: Secondary | ICD-10-CM | POA: Diagnosis not present

## 2022-04-19 DIAGNOSIS — H524 Presbyopia: Secondary | ICD-10-CM | POA: Diagnosis not present

## 2022-05-02 DIAGNOSIS — N312 Flaccid neuropathic bladder, not elsewhere classified: Secondary | ICD-10-CM | POA: Diagnosis not present

## 2022-05-28 ENCOUNTER — Telehealth: Payer: Self-pay | Admitting: Physical Medicine and Rehabilitation

## 2022-05-28 NOTE — Telephone Encounter (Signed)
Patient called needing to schedule an appointment with Dr Ernestina Patches for her back. The number to contact patient is [phone]

## 2022-05-29 NOTE — Telephone Encounter (Signed)
Pt called back. °

## 2022-06-02 ENCOUNTER — Telehealth: Payer: Self-pay | Admitting: Obstetrics and Gynecology

## 2022-06-02 DIAGNOSIS — N312 Flaccid neuropathic bladder, not elsewhere classified: Secondary | ICD-10-CM | POA: Diagnosis not present

## 2022-06-02 NOTE — Progress Notes (Unsigned)
Order for 36fcatheters sent to [employer] via escript. Confirmation received that the order transmitted was successful.   Supply request has been scanned

## 2022-06-02 NOTE — Telephone Encounter (Signed)
Pt was contacted. A DME request was submitted to [employer] for cath supplies for 39f catheters

## 2022-06-03 ENCOUNTER — Telehealth: Payer: Self-pay | Admitting: Physical Medicine and Rehabilitation

## 2022-06-03 NOTE — Telephone Encounter (Signed)
Patient called needing to schedule an appointment with Dr. Ernestina Patches. Patient said she was referred to him. The  number to contact patient is [phone]

## 2022-06-04 ENCOUNTER — Telehealth: Payer: Self-pay | Admitting: Physical Medicine and Rehabilitation

## 2022-06-04 NOTE — Telephone Encounter (Signed)
Pt called please call after lunch Pt phone number [phone]

## 2022-06-09 DIAGNOSIS — J449 Chronic obstructive pulmonary disease, unspecified: Secondary | ICD-10-CM | POA: Diagnosis not present

## 2022-06-09 DIAGNOSIS — E782 Mixed hyperlipidemia: Secondary | ICD-10-CM | POA: Diagnosis not present

## 2022-06-09 DIAGNOSIS — I10 Essential (primary) hypertension: Secondary | ICD-10-CM | POA: Diagnosis not present

## 2022-06-09 DIAGNOSIS — M519 Unspecified thoracic, thoracolumbar and lumbosacral intervertebral disc disorder: Secondary | ICD-10-CM | POA: Diagnosis not present

## 2022-06-09 DIAGNOSIS — F331 Major depressive disorder, recurrent, moderate: Secondary | ICD-10-CM | POA: Diagnosis not present

## 2022-06-09 DIAGNOSIS — R7303 Prediabetes: Secondary | ICD-10-CM | POA: Diagnosis not present

## 2022-06-09 DIAGNOSIS — E039 Hypothyroidism, unspecified: Secondary | ICD-10-CM | POA: Diagnosis not present

## 2022-06-09 DIAGNOSIS — I700 Atherosclerosis of aorta: Secondary | ICD-10-CM | POA: Diagnosis not present

## 2022-06-09 DIAGNOSIS — M797 Fibromyalgia: Secondary | ICD-10-CM | POA: Diagnosis not present

## 2022-06-23 ENCOUNTER — Ambulatory Visit: Payer: Self-pay

## 2022-06-23 ENCOUNTER — Ambulatory Visit: Payer: Medicare HMO | Admitting: Physical Medicine and Rehabilitation

## 2022-06-23 ENCOUNTER — Encounter: Payer: Self-pay | Admitting: Physical Medicine and Rehabilitation

## 2022-06-23 VITALS — BP 102/61 | HR 70

## 2022-06-23 DIAGNOSIS — M5416 Radiculopathy, lumbar region: Secondary | ICD-10-CM | POA: Diagnosis not present

## 2022-06-23 MED ORDER — METHYLPREDNISOLONE ACETATE 80 MG/ML IJ SUSP
80.0000 mg | Freq: Once | INTRAMUSCULAR | Status: AC
  Administered 2022-06-23: 80 mg

## 2022-06-23 NOTE — Progress Notes (Unsigned)
Pt state lower back pain that travels to her both legs, mostly left. Pt state walking, standing and laying down makes the pain worse. Pt state she takes over the counter pain meds to help ease her pain.  Numeric Pain Rating Scale and Functional Assessment Average Pain 9   In the last MONTH (on 0-10 scale) has pain interfered with the following?  1. General activity like being  able to carry out your everyday physical activities such as walking, climbing stairs, carrying groceries, or moving a chair?  Rating(10)   +Driver, -BT, -Dye Allergies.

## 2022-06-23 NOTE — Patient Instructions (Signed)

## 2022-06-26 NOTE — Progress Notes (Signed)
Stefanie Carter - 76 y.o. female MRN [id number]  Date of birth: Oct 09, 1946  Office Visit Note: Visit Date: 06/23/2022 PCP: Wenda Low, MD Referred by: Wenda Low, MD  Subjective: Chief Complaint  Patient presents with   Lower Back - Pain   Right Leg - Pain   Left Leg - Pain   HPI:  Stefanie Carter is a 76 y.o. female who comes in today for planned repeat Bilateral L4-5  Lumbar Transforaminal epidural steroid injection with fluoroscopic guidance.  The patient has failed conservative care including home exercise, medications, time and activity modification.  This injection will be diagnostic and hopefully therapeutic.  Please see requesting physician notes for further details and justification. Patient received more than 50% pain relief from prior injection.   Referring: Dr. Rodell Perna   ROS Otherwise per HPI.  Assessment & Plan: Visit Diagnoses:    ICD-10-CM   1. Lumbar radiculopathy  M54.16 XR C-ARM NO REPORT    Epidural Steroid injection    methylPREDNISolone acetate (DEPO-MEDROL) injection 80 mg      Plan: No additional findings.   Meds & Orders:  Meds ordered this encounter  Medications   methylPREDNISolone acetate (DEPO-MEDROL) injection 80 mg    Orders Placed This Encounter  Procedures   XR C-ARM NO REPORT   Epidural Steroid injection    Follow-up: Return for visit to requesting provider as needed.   Procedures: No procedures performed  Lumbosacral Transforaminal Epidural Steroid Injection - Sub-Pedicular Approach with Fluoroscopic Guidance  Patient: Stefanie Carter      Date of Birth: 04-03-1946 MRN: [id number] PCP: Wenda Low, MD      Visit Date: 06/23/2022   Universal Protocol:    Date/Time: 06/23/2022  Consent Given By: the patient  Position: PRONE  Additional Comments: Vital signs were monitored before and after the procedure. Patient was prepped and draped in the usual sterile fashion. The correct patient, procedure, and  site was verified.   Injection Procedure Details:   Procedure diagnoses: Lumbar radiculopathy [M54.16]    Meds Administered:  Meds ordered this encounter  Medications   methylPREDNISolone acetate (DEPO-MEDROL) injection 80 mg    Laterality: Bilateral  Location/Site: L4  Needle:5.0 in., 22 ga.  Short bevel or Quincke spinal needle  Needle Placement: Transforaminal  Findings:    -Comments: Excellent flow of contrast along the nerve, nerve root and into the epidural space.  Procedure Details: After squaring off the end-plates to get a true AP view, the C-arm was positioned so that an oblique view of the foramen as noted above was visualized. The target area is just inferior to the "nose of the scotty dog" or sub pedicular. The soft tissues overlying this structure were infiltrated with 2-3 ml. of 1% Lidocaine without Epinephrine.  The spinal needle was inserted toward the target using a "trajectory" view along the fluoroscope beam.  Under AP and lateral visualization, the needle was advanced so it did not puncture dura and was located close the 6 O'Clock position of the pedical in AP tracterory. Biplanar projections were used to confirm position. Aspiration was confirmed to be negative for CSF and/or blood. A 1-2 ml. volume of Isovue-250 was injected and flow of contrast was noted at each level. Radiographs were obtained for documentation purposes.   After attaining the desired flow of contrast documented above, a 0.5 to 1.0 ml test dose of 0.25% Marcaine was injected into each respective transforaminal space.  The patient was observed for 90 seconds post injection.  After no  sensory deficits were reported, and normal lower extremity motor function was noted,   the above injectate was administered so that equal amounts of the injectate were placed at each foramen (level) into the transforaminal epidural space.   Additional Comments:  The patient tolerated the procedure well Dressing:  2 x 2 sterile gauze and Band-Aid    Post-procedure details: Patient was observed during the procedure. Post-procedure instructions were reviewed.  Patient left the clinic in stable condition.    Clinical History: No specialty comments available.     Objective:  VS:  HT:    WT:   BMI:     BP:102/61  HR:70bpm  TEMP: ( )  RESP:  Physical Exam Vitals and nursing note reviewed.  Constitutional:      General: She is not in acute distress.    Appearance: Normal appearance. She is not ill-appearing.  HENT:     Head: Normocephalic and atraumatic.     Right Ear: External ear normal.     Left Ear: External ear normal.  Eyes:     Extraocular Movements: Extraocular movements intact.  Cardiovascular:     Rate and Rhythm: Normal rate.     Pulses: Normal pulses.  Pulmonary:     Effort: Pulmonary effort is normal. No respiratory distress.  Abdominal:     General: There is no distension.     Palpations: Abdomen is soft.  Musculoskeletal:        General: Tenderness present.     Cervical back: Neck supple.     Right lower leg: No edema.     Left lower leg: No edema.     Comments: Patient has good distal strength with no pain over the greater trochanters.  No clonus or focal weakness.  Skin:    Findings: No erythema, lesion or rash.  Neurological:     General: No focal deficit present.     Mental Status: She is alert and oriented to person, place, and time.     Sensory: No sensory deficit.     Motor: No weakness or abnormal muscle tone.     Coordination: Coordination normal.  Psychiatric:        Mood and Affect: Mood normal.        Behavior: Behavior normal.      Imaging: No results found.

## 2022-06-26 NOTE — Procedures (Signed)
Lumbosacral Transforaminal Epidural Steroid Injection - Sub-Pedicular Approach with Fluoroscopic Guidance  Patient: Stefanie Carter      Date of Birth: 11/11/1945 MRN: [id number] PCP: Wenda Low, MD      Visit Date: 06/23/2022   Universal Protocol:    Date/Time: 06/23/2022  Consent Given By: the patient  Position: PRONE  Additional Comments: Vital signs were monitored before and after the procedure. Patient was prepped and draped in the usual sterile fashion. The correct patient, procedure, and site was verified.   Injection Procedure Details:   Procedure diagnoses: Lumbar radiculopathy [M54.16]    Meds Administered:  Meds ordered this encounter  Medications   methylPREDNISolone acetate (DEPO-MEDROL) injection 80 mg    Laterality: Bilateral  Location/Site: L4  Needle:5.0 in., 22 ga.  Short bevel or Quincke spinal needle  Needle Placement: Transforaminal  Findings:    -Comments: Excellent flow of contrast along the nerve, nerve root and into the epidural space.  Procedure Details: After squaring off the end-plates to get a true AP view, the C-arm was positioned so that an oblique view of the foramen as noted above was visualized. The target area is just inferior to the "nose of the scotty dog" or sub pedicular. The soft tissues overlying this structure were infiltrated with 2-3 ml. of 1% Lidocaine without Epinephrine.  The spinal needle was inserted toward the target using a "trajectory" view along the fluoroscope beam.  Under AP and lateral visualization, the needle was advanced so it did not puncture dura and was located close the 6 O'Clock position of the pedical in AP tracterory. Biplanar projections were used to confirm position. Aspiration was confirmed to be negative for CSF and/or blood. A 1-2 ml. volume of Isovue-250 was injected and flow of contrast was noted at each level. Radiographs were obtained for documentation purposes.   After attaining the  desired flow of contrast documented above, a 0.5 to 1.0 ml test dose of 0.25% Marcaine was injected into each respective transforaminal space.  The patient was observed for 90 seconds post injection.  After no sensory deficits were reported, and normal lower extremity motor function was noted,   the above injectate was administered so that equal amounts of the injectate were placed at each foramen (level) into the transforaminal epidural space.   Additional Comments:  The patient tolerated the procedure well Dressing: 2 x 2 sterile gauze and Band-Aid    Post-procedure details: Patient was observed during the procedure. Post-procedure instructions were reviewed.  Patient left the clinic in stable condition.

## 2022-07-03 DIAGNOSIS — Z1231 Encounter for screening mammogram for malignant neoplasm of breast: Secondary | ICD-10-CM | POA: Diagnosis not present

## 2022-07-07 DIAGNOSIS — R339 Retention of urine, unspecified: Secondary | ICD-10-CM | POA: Diagnosis not present

## 2022-07-18 ENCOUNTER — Ambulatory Visit: Payer: Medicare HMO | Admitting: Obstetrics and Gynecology

## 2022-07-18 ENCOUNTER — Encounter: Payer: Self-pay | Admitting: Obstetrics and Gynecology

## 2022-07-18 VITALS — BP 108/59 | HR 80

## 2022-07-18 DIAGNOSIS — N812 Incomplete uterovaginal prolapse: Secondary | ICD-10-CM

## 2022-07-18 NOTE — Progress Notes (Signed)
[facility] Return Visit  SUBJECTIVE  History of Present Illness: Georgena Weisheit is a 76 y.o. female seen in follow-up for neurogenic bladder.  She has a 2-1/2 in [place] pessary placed for prolapse.    Denies vaginal bleeding, pain or discharge.   Doing well with self-catheterizing. She is now getting supplies from [employer]. She is requesting diapers for her incontinence from her supplier as well.   Past Medical History: Patient  has a past medical history of Anxiety, Diabetes ([place]), HLD (hyperlipidemia), HTN (hypertension), Hypothyroid, and Spinal stenosis.   Past Surgical History: She  has a past surgical history that includes Appendectomy; Tonsillectomy; and Salivary gland surgery.   Medications: She has a current medication list which includes the following prescription(s): co q10, famotidine, levothyroxine, multiple vitamin, pravastatin sodium, premarin, pseudoephedrine, sertraline, sitagliptin, and cholecalciferol.   Allergies: Patient is allergic to penicillins, metformin hcl, sulfa antibiotics, sulfamethoxazole-trimethoprim, varenicline, and codeine.   Social History: Patient  reports that she quit smoking about 10 years ago. Her smoking use included cigarettes. She has a 30.00 pack-year smoking history. She has never used smokeless tobacco. She reports current alcohol use. She reports that she does not use drugs.      OBJECTIVE     Physical Exam: Vitals:   07/18/22 1414  BP: (!) 108/59  Pulse: 80    Gen: No apparent distress, A&O x 3.  Detailed Urogynecologic Evaluation:  Normal external genitalia  Pessary was noted to be in place. Cervix was noted to be partially through the hole in the center of the pessary- needed to be pushed out to remove pessary. Pessary removed and cleaned. Speculum exam showed no lesions or bleeding in the vagina. Pessary replaced and was comfortable.     ASSESSMENT AND PLAN    [name] is a 76 y.o. with:   1. Uterovaginal prolapse, incomplete     Neurogenic bladder - Self cath 4-7 times per day.  - Will request diapers for incontinence  Prolapse - Continue 2-1/2 in [place] pessary. Looked to see if we have a 2-1/2 ss gellhorn but did not have one available- we will order in hopes this will prevent her cervix from getting stuck in the hole in the pessary. - She will come once every 4 months for pessary cleanings.   Return 4 months for pessary check  Jaquita Folds, MD  Time spent: I spent 25 minutes dedicated to the care of this patient on the date of this encounter to include pre-visit review of records, face-to-face time with the patient  and post visit documentation.

## 2022-07-21 NOTE — Progress Notes (Signed)
Pessary has been ordered

## 2022-08-25 DIAGNOSIS — R339 Retention of urine, unspecified: Secondary | ICD-10-CM | POA: Diagnosis not present

## 2022-09-08 ENCOUNTER — Ambulatory Visit
Admission: RE | Admit: 2022-09-08 | Discharge: 2022-09-08 | Disposition: A | Discharge status: Home / Self Care | Payer: Medicare HMO | Source: Ambulatory Visit | Attending: Internal Medicine | Admitting: Internal Medicine

## 2022-09-08 ENCOUNTER — Other Ambulatory Visit: Payer: Self-pay | Admitting: Internal Medicine

## 2022-09-08 DIAGNOSIS — M8580 Other specified disorders of bone density and structure, unspecified site: Secondary | ICD-10-CM | POA: Diagnosis not present

## 2022-09-08 DIAGNOSIS — F331 Major depressive disorder, recurrent, moderate: Secondary | ICD-10-CM | POA: Diagnosis not present

## 2022-09-08 DIAGNOSIS — J449 Chronic obstructive pulmonary disease, unspecified: Secondary | ICD-10-CM | POA: Diagnosis not present

## 2022-09-08 DIAGNOSIS — M519 Unspecified thoracic, thoracolumbar and lumbosacral intervertebral disc disorder: Secondary | ICD-10-CM | POA: Diagnosis not present

## 2022-09-08 DIAGNOSIS — K5900 Constipation, unspecified: Secondary | ICD-10-CM | POA: Diagnosis not present

## 2022-09-08 DIAGNOSIS — M797 Fibromyalgia: Secondary | ICD-10-CM | POA: Diagnosis not present

## 2022-09-08 DIAGNOSIS — I700 Atherosclerosis of aorta: Secondary | ICD-10-CM | POA: Diagnosis not present

## 2022-09-08 DIAGNOSIS — Z0000 Encounter for general adult medical examination without abnormal findings: Secondary | ICD-10-CM | POA: Diagnosis not present

## 2022-09-08 DIAGNOSIS — R7303 Prediabetes: Secondary | ICD-10-CM | POA: Diagnosis not present

## 2022-09-08 DIAGNOSIS — K219 Gastro-esophageal reflux disease without esophagitis: Secondary | ICD-10-CM | POA: Diagnosis not present

## 2022-09-08 DIAGNOSIS — I10 Essential (primary) hypertension: Secondary | ICD-10-CM | POA: Diagnosis not present

## 2022-09-08 DIAGNOSIS — E039 Hypothyroidism, unspecified: Secondary | ICD-10-CM | POA: Diagnosis not present

## 2022-09-24 DIAGNOSIS — R339 Retention of urine, unspecified: Secondary | ICD-10-CM | POA: Diagnosis not present

## 2022-10-24 DIAGNOSIS — R339 Retention of urine, unspecified: Secondary | ICD-10-CM | POA: Diagnosis not present

## 2022-11-19 ENCOUNTER — Ambulatory Visit: Payer: Medicare HMO | Admitting: Obstetrics and Gynecology

## 2022-11-26 ENCOUNTER — Ambulatory Visit: Payer: Medicare HMO | Admitting: Obstetrics and Gynecology

## 2022-11-28 DIAGNOSIS — R339 Retention of urine, unspecified: Secondary | ICD-10-CM | POA: Diagnosis not present

## 2022-12-02 DIAGNOSIS — R339 Retention of urine, unspecified: Secondary | ICD-10-CM | POA: Diagnosis not present

## 2022-12-26 DIAGNOSIS — Z01419 Encounter for gynecological examination (general) (routine) without abnormal findings: Secondary | ICD-10-CM | POA: Diagnosis not present

## 2022-12-26 DIAGNOSIS — Z79890 Hormone replacement therapy (postmenopausal): Secondary | ICD-10-CM | POA: Diagnosis not present

## 2022-12-26 DIAGNOSIS — Z4689 Encounter for fitting and adjustment of other specified devices: Secondary | ICD-10-CM | POA: Diagnosis not present

## 2023-01-21 DIAGNOSIS — R339 Retention of urine, unspecified: Secondary | ICD-10-CM | POA: Diagnosis not present

## 2023-02-20 DIAGNOSIS — R339 Retention of urine, unspecified: Secondary | ICD-10-CM | POA: Diagnosis not present

## 2023-03-11 DIAGNOSIS — R7303 Prediabetes: Secondary | ICD-10-CM | POA: Diagnosis not present

## 2023-03-11 DIAGNOSIS — E782 Mixed hyperlipidemia: Secondary | ICD-10-CM | POA: Diagnosis not present

## 2023-03-11 DIAGNOSIS — F331 Major depressive disorder, recurrent, moderate: Secondary | ICD-10-CM | POA: Diagnosis not present

## 2023-03-11 DIAGNOSIS — J449 Chronic obstructive pulmonary disease, unspecified: Secondary | ICD-10-CM | POA: Diagnosis not present

## 2023-03-11 DIAGNOSIS — M797 Fibromyalgia: Secondary | ICD-10-CM | POA: Diagnosis not present

## 2023-03-11 DIAGNOSIS — I700 Atherosclerosis of aorta: Secondary | ICD-10-CM | POA: Diagnosis not present

## 2023-03-11 DIAGNOSIS — E039 Hypothyroidism, unspecified: Secondary | ICD-10-CM | POA: Diagnosis not present

## 2023-03-11 DIAGNOSIS — F419 Anxiety disorder, unspecified: Secondary | ICD-10-CM | POA: Diagnosis not present

## 2023-03-11 DIAGNOSIS — M519 Unspecified thoracic, thoracolumbar and lumbosacral intervertebral disc disorder: Secondary | ICD-10-CM | POA: Diagnosis not present

## 2023-03-11 DIAGNOSIS — I10 Essential (primary) hypertension: Secondary | ICD-10-CM | POA: Diagnosis not present

## 2023-03-24 DIAGNOSIS — R339 Retention of urine, unspecified: Secondary | ICD-10-CM | POA: Diagnosis not present

## 2023-04-23 DIAGNOSIS — R339 Retention of urine, unspecified: Secondary | ICD-10-CM | POA: Diagnosis not present

## 2023-05-13 ENCOUNTER — Telehealth: Payer: Self-pay | Admitting: Physical Medicine and Rehabilitation

## 2023-05-13 NOTE — Telephone Encounter (Signed)
Pt called in stating she is ready to schedule and its okay to leave a VM

## 2023-05-15 NOTE — Telephone Encounter (Signed)
Spoke with patient and she is requesting a repeat injection. She stated she is having the same type of pain. Last injection 05/2022 lasted several months and she had at least 75% relief. No new falls, accidents or injuries. Please advise

## 2023-05-15 NOTE — Telephone Encounter (Signed)
LVM to return call to see how long the last injection lasted and how much it helped

## 2023-05-17 ENCOUNTER — Other Ambulatory Visit: Payer: Self-pay | Admitting: Physical Medicine and Rehabilitation

## 2023-05-17 DIAGNOSIS — M5416 Radiculopathy, lumbar region: Secondary | ICD-10-CM

## 2023-05-25 DIAGNOSIS — R339 Retention of urine, unspecified: Secondary | ICD-10-CM | POA: Diagnosis not present

## 2023-05-26 DIAGNOSIS — R9389 Abnormal findings on diagnostic imaging of other specified body structures: Secondary | ICD-10-CM | POA: Diagnosis not present

## 2023-05-28 ENCOUNTER — Ambulatory Visit: Payer: Medicare HMO | Admitting: Physical Medicine and Rehabilitation

## 2023-05-28 ENCOUNTER — Encounter: Payer: Self-pay | Admitting: Radiology

## 2023-05-28 NOTE — Progress Notes (Signed)
Tracking [id number]  Note: Authorization pended for missing information. Please provide documentation with patient's name and date of birth for the following:   The most recent office visit note(s) in reference to the submitted authorization- Patient came into office today.  She complains of LBP and bilateral leg pain. Patient has had injections in her spine previously. Patient has L5 transitional segment which is sacralized.  Plan is to repeat L4 transforaminal injection.    Duration of relief of at least 3 months from previous ESI-  Last injection was on 06/23/22.  Patient had relief x 10 months from injection.   Presence of lumbar radiculopathy (ex. radicular pain in the same dermatome at requested levels, positive SLR test, or Neurogenic Claudication signs/symptoms: positive shopping cart sign, numbness and tingling down the legs, etc.)-  patient has bilateral lower extremity radiculopathy.   Current visual analog or numeric pain rating scale of at least 4/10-  Pain today is at an 8 our of 10.  Failure four weeks of conservative therapies, which includes appropriate oral medications (ex. NSAIDs, analgesics, etc.) and physical therapy to extent tolerated/spinal manipulation therapy/cognitive behavioral therapy (CBT)/home exercise program, or other therapies-  Patient has failed conservative care including home exercise, medications, time and activity modification.  Current noninvasive conservative treatments-  HEP/walking, rest/activity modification, OTC meds.  Patient's is part of an active rehabilitation program, home exercise program or functional restoration program-  HEP, walking.

## 2023-06-03 ENCOUNTER — Other Ambulatory Visit: Payer: Self-pay

## 2023-06-03 ENCOUNTER — Ambulatory Visit: Payer: Medicare HMO | Admitting: Physical Medicine and Rehabilitation

## 2023-06-03 VITALS — BP 133/70 | HR 66

## 2023-06-03 DIAGNOSIS — M48062 Spinal stenosis, lumbar region with neurogenic claudication: Secondary | ICD-10-CM | POA: Diagnosis not present

## 2023-06-03 DIAGNOSIS — R269 Unspecified abnormalities of gait and mobility: Secondary | ICD-10-CM

## 2023-06-03 DIAGNOSIS — M4316 Spondylolisthesis, lumbar region: Secondary | ICD-10-CM | POA: Diagnosis not present

## 2023-06-03 DIAGNOSIS — G894 Chronic pain syndrome: Secondary | ICD-10-CM | POA: Diagnosis not present

## 2023-06-03 DIAGNOSIS — M5416 Radiculopathy, lumbar region: Secondary | ICD-10-CM

## 2023-06-03 MED ORDER — METHYLPREDNISOLONE ACETATE 80 MG/ML IJ SUSP
80.0000 mg | Freq: Once | INTRAMUSCULAR | Status: AC
  Administered 2023-06-03: 80 mg

## 2023-06-03 NOTE — Progress Notes (Signed)
Functional Pain Scale - descriptive words and definitions  Distracting (5)    Aware of pain/able to complete some ADL's but limited by pain/sleep is affected and active distractions are only slightly useful. Moderate range order  Average Pain  varies   +Driver, -BT, -Dye Allergies.  Lower back pain on both sides with radiation in the legs. Stabbing pain in the toes

## 2023-06-03 NOTE — Patient Instructions (Signed)

## 2023-06-17 ENCOUNTER — Encounter: Payer: Self-pay | Admitting: Physical Medicine and Rehabilitation

## 2023-06-17 NOTE — Procedures (Signed)
Lumbosacral Transforaminal Epidural Steroid Injection - Sub-Pedicular Approach with Fluoroscopic Guidance  Patient: Stefanie Carter      Date of Birth: 1946-05-12 MRN: [id number] PCP: Georgann Housekeeper, MD      Visit Date: 06/03/2023   Universal Protocol:    Date/Time: 06/03/2023  Consent Given By: the patient  Position: PRONE  Additional Comments: Vital signs were monitored before and after the procedure. Patient was prepped and draped in the usual sterile fashion. The correct patient, procedure, and site was verified.   Injection Procedure Details:   Procedure diagnoses: Lumbar radiculopathy [M54.16]    Meds Administered:  Meds ordered this encounter  Medications   methylPREDNISolone acetate (DEPO-MEDROL) injection 80 mg    Laterality: Bilateral  Location/Site: L4  Needle:5.0 in., 22 ga.  Short bevel or Quincke spinal needle  Needle Placement: Transforaminal  Findings:    -Comments: Excellent flow of contrast along the nerve, nerve root and into the epidural space.  Right-sided procedure was really uneventful.  Left-sided procedure patient had a lot of increased nerve tension symptoms during instillation of the medication.  She was having cramping and pain in the leg.  Reassured her that we do see this with stenosis that the injection images were very good and it was just medication along these tight spaces and that the nerve would let you know when the medicine flowing through the entire spot.  She was concerned because she had not really experienced that in prior injections.  Symptoms did calm down in the recovery area she was discharged without problem.  Procedure Details: After squaring off the end-plates to get a true AP view, the C-arm was positioned so that an oblique view of the foramen as noted above was visualized. The target area is just inferior to the "nose of the scotty dog" or sub pedicular. The soft tissues overlying this structure were infiltrated with  2-3 ml. of 1% Lidocaine without Epinephrine.  The spinal needle was inserted toward the target using a "trajectory" view along the fluoroscope beam.  Under AP and lateral visualization, the needle was advanced so it did not puncture dura and was located close the 6 O'Clock position of the pedical in AP tracterory. Biplanar projections were used to confirm position. Aspiration was confirmed to be negative for CSF and/or blood. A 1-2 ml. volume of Isovue-250 was injected and flow of contrast was noted at each level. Radiographs were obtained for documentation purposes.   After attaining the desired flow of contrast documented above, a 0.5 to 1.0 ml test dose of 0.25% Marcaine was injected into each respective transforaminal space.  The patient was observed for 90 seconds post injection.  After no sensory deficits were reported, and normal lower extremity motor function was noted,   the above injectate was administered so that equal amounts of the injectate were placed at each foramen (level) into the transforaminal epidural space.   Additional Comments:   Dressing: 2 x 2 sterile gauze and Band-Aid    Post-procedure details: Patient was observed during the procedure. Post-procedure instructions were reviewed.  Patient left the clinic in stable condition.

## 2023-06-17 NOTE — Progress Notes (Signed)
Stefanie Carter - 77 y.o. female MRN [id number]  Date of birth: [birth year]  Office Visit Note: Visit Date: 06/03/2023 PCP: Georgann Housekeeper, MD Referred by: Georgann Housekeeper, MD  Subjective: Chief Complaint  Patient presents with   Lower Back - Pain   HPI: Stefanie Carter is a 77 y.o. female who comes in today for evaluation and management at the request of Dr. Annell Greening for chronic, worsening and severe low back pain with bilateral hip and leg pain with paresthesias and numbness and shooting pains into the feet and toes.  The last time I saw the patient was in August of last year when we completed bilateral L4 transforaminal epidural steroid injection for her severe stenosis at L4.  Since that time she has been doing okay this shot did seem to help her quite a bit she has had recent worsening over the last several months.  She tries to avoid getting the injection as long as she can but has been bothering her quite a bit.  No new injuries or focal weakness.  She still has difficulty ambulating and she has a lot of numbness in the legs.  Has a history of prediabetes not on any medications at this point.  Her case is complicated by anxiety as well as hypothyroidism.  She has been followed in the past by Dr. Annell Greening but she is adamant about never having surgery of her spine.  MRI from 2021 does show significant listhesis with severe stenosis at this level and I do think this is causing a lot of her leg complaints.  She does asked me about restless leg and neuropathy etc. which we did go over today.  She is ambulating with a cane.  She has had no other red flag symptoms and no new trauma etc.  Gets pain in the hips but no groin pain.  Sometimes pain in the upper back as well.   I spent more than 30 minutes speaking face-to-face with the patient with 50% of the time in counseling and discussing coordination of care.       Review of Systems  Musculoskeletal:  Positive for back pain and joint  pain.  Neurological:  Positive for tingling and weakness.  All other systems reviewed and are negative.  Otherwise per HPI.  Assessment & Plan: Visit Diagnoses:    ICD-10-CM   1. Lumbar radiculopathy  M54.16 XR C-ARM NO REPORT    Epidural Steroid injection    methylPREDNISolone acetate (DEPO-MEDROL) injection 80 mg    2. Spinal stenosis of lumbar region with neurogenic claudication  M48.062     3. Spondylolisthesis of lumbar region  M43.16     4. Chronic pain syndrome  G89.4     5. Gait abnormality  R26.9        Plan: Findings:  Chronic and worsening and severe low back pain bilateral hip and leg pain with paresthesias and a fairly classic L5 distribution but somewhat multifocal.  She has difficulty ambulating in general with some balance issues but no foot drop or focal weakness.  This all seems to stem from the severe stenosis.  She is adamant that she will never have any sort of back surgery.  She has a pretty significant problem in her lumbar spine but seems to get relief with intermittent injection.  I do think it is wise to repeat the injection today.  Depending on relief would look at repeated MRI of the lumbar spine to see if any changes have progressed  although treatment plan would be injection and possibly referral to chronic comprehensive pain management.  Her case is complicated by her anxiety.    Meds & Orders:  Meds ordered this encounter  Medications   methylPREDNISolone acetate (DEPO-MEDROL) injection 80 mg    Orders Placed This Encounter  Procedures   XR C-ARM NO REPORT   Epidural Steroid injection    Follow-up: Return if symptoms worsen or fail to improve.   Procedures: No procedures performed  Lumbosacral Transforaminal Epidural Steroid Injection - Sub-Pedicular Approach with Fluoroscopic Guidance  Patient: Stefanie Carter      Date of Birth: [birth year] MRN: [id number] PCP: Georgann Housekeeper, MD      Visit Date: 06/03/2023   Universal Protocol:     Date/Time: 06/03/2023  Consent Given By: the patient  Position: PRONE  Additional Comments: Vital signs were monitored before and after the procedure. Patient was prepped and draped in the usual sterile fashion. The correct patient, procedure, and site was verified.   Injection Procedure Details:   Procedure diagnoses: Lumbar radiculopathy [M54.16]    Meds Administered:  Meds ordered this encounter  Medications   methylPREDNISolone acetate (DEPO-MEDROL) injection 80 mg    Laterality: Bilateral  Location/Site: L4  Needle:5.0 in., 22 ga.  Short bevel or Quincke spinal needle  Needle Placement: Transforaminal  Findings:    -Comments: Excellent flow of contrast along the nerve, nerve root and into the epidural space.  Right-sided procedure was really uneventful.  Left-sided procedure patient had a lot of increased nerve tension symptoms during instillation of the medication.  She was having cramping and pain in the leg.  Reassured her that we do see this with stenosis that the injection images were very good and it was just medication along these tight spaces and that the nerve would let you know when the medicine flowing through the entire spot.  She was concerned because she had not really experienced that in prior injections.  Symptoms did calm down in the recovery area she was discharged without problem.  Procedure Details: After squaring off the end-plates to get a true AP view, the C-arm was positioned so that an oblique view of the foramen as noted above was visualized. The target area is just inferior to the "nose of the scotty dog" or sub pedicular. The soft tissues overlying this structure were infiltrated with 2-3 ml. of 1% Lidocaine without Epinephrine.  The spinal needle was inserted toward the target using a "trajectory" view along the fluoroscope beam.  Under AP and lateral visualization, the needle was advanced so it did not puncture dura and was located close the 6  O'Clock position of the pedical in AP tracterory. Biplanar projections were used to confirm position. Aspiration was confirmed to be negative for CSF and/or blood. A 1-2 ml. volume of Isovue-250 was injected and flow of contrast was noted at each level. Radiographs were obtained for documentation purposes.   After attaining the desired flow of contrast documented above, a 0.5 to 1.0 ml test dose of 0.25% Marcaine was injected into each respective transforaminal space.  The patient was observed for 90 seconds post injection.  After no sensory deficits were reported, and normal lower extremity motor function was noted,   the above injectate was administered so that equal amounts of the injectate were placed at each foramen (level) into the transforaminal epidural space.   Additional Comments:   Dressing: 2 x 2 sterile gauze and Band-Aid    Post-procedure details: Patient was observed during  the procedure. Post-procedure instructions were reviewed.  Patient left the clinic in stable condition.    Clinical History: MRI LUMBAR SPINE WITHOUT CONTRAST    TECHNIQUE:  Multiplanar, multisequence MR imaging of the lumbar spine was  performed. No intravenous contrast was administered.    COMPARISON:  MRI 06/14/2010.  Radiography 07/12/2020.    FINDINGS:  Segmentation: 5 lumbar type vertebral bodies numbered as previous,  with L5 being sacralized and transitional.    Alignment: 6 mm anterolisthesis L4-5. This is similar to the study  of 2011.    Vertebrae:  No fracture or primary bone lesion.    Conus medullaris and cauda equina: Conus extends to the L1-2 level.  Conus and cauda equina appear normal.    Paraspinal and other soft tissues: Negative    Disc levels:    Disc bulges at every level from T8-9 through L1-2. No apparent  compressive stenosis of the canal or foramina however.    L2-3: Bulging of the disc. Facet and ligamentous hypertrophy. Mild  multifactorial stenosis but  without focal neural compression.    L3-4: Mild bulging of the disc. Mild facet and ligamentous  prominence. Mild narrowing of the lateral recesses and foramina but  without definite neural compression.    L4-5: Chronic facet arthropathy with 6 mm of anterolisthesis. Pseudo  disc herniation. Severe stenosis of the canal at and just below the  disc level. Neural compression quite likely at this level. Bilateral  foraminal stenosis could compress either or both exiting L4 nerves.  This stenosis has probably worsened slightly compared the study of  2011.    L5-S1: Transitional level.  No canal or foraminal stenosis.    IMPRESSION:  1. L5 is a sacralized and transitional vertebra.  2. Severe multifactorial spinal stenosis at the L4-5 level, slightly  worsened compared to the study of 2011. Chronic facet arthropathy  with 6 mm of anterolisthesis. Pseudo disc herniation. Bilateral  foraminal stenosis could compress either or both exiting L4 nerves.  3. Mild multifactorial stenosis at L2-3 and L3-4 but without  distinct neural compression.      Electronically Signed    By: Paulina Fusi M.D.    On: 08/04/2020 12:47   She reports that she quit smoking about 11 years ago. Her smoking use included cigarettes. She started smoking about 41 years ago. She has a 30 pack-year smoking history. She has never used smokeless tobacco. No results for input(s): "HGBA1C", "LABURIC" in the last 8760 hours.  Objective:  VS:  HT:    WT:   BMI:     BP:133/70  HR:66bpm  TEMP: ( )  RESP:  Physical Exam Vitals and nursing note reviewed.  Constitutional:      General: She is not in acute distress.    Appearance: Normal appearance. She is well-developed. She is not ill-appearing.     Comments: Very thin appearing  HENT:     Head: Normocephalic and atraumatic.  Eyes:     Conjunctiva/sclera: Conjunctivae normal.     Pupils: Pupils are equal, round, and reactive to light.  Cardiovascular:     Rate and  Rhythm: Normal rate.     Pulses: Normal pulses.  Pulmonary:     Effort: Pulmonary effort is normal.  Musculoskeletal:        General: Tenderness present.     Cervical back: Tenderness present.     Right lower leg: No edema.     Left lower leg: No edema.     Comments:  Patient is slow to rise from a sitting position and stands with a very angulated lumbar lordosis.  She has good distal strength without clonus.  She has no pain with hip rotation.  She does ambulate with a cane.  Dysesthesia in both lower limbs.  Skin:    General: Skin is warm and dry.     Findings: No erythema or rash.  Neurological:     General: No focal deficit present.     Mental Status: She is alert and oriented to person, place, and time.     Sensory: No sensory deficit.     Motor: No abnormal muscle tone.     Coordination: Coordination normal.     Gait: Gait normal.  Psychiatric:        Mood and Affect: Mood normal.        Behavior: Behavior normal.     Ortho Exam  Imaging: No results found.  Past Medical/Family/Surgical/Social History: Medications & Allergies reviewed per EMR, new medications updated. Patient Active Problem List   Diagnosis Date Noted   Spinal stenosis 06/20/2013   HLD (hyperlipidemia) 06/20/2013   BP (high blood pressure) 06/20/2013   Anxiety 06/20/2013   Past Medical History:  Diagnosis Date   Anxiety    Diabetes (HCC)    HLD (hyperlipidemia)    HTN (hypertension)    Hypothyroid    Spinal stenosis    Family History  Problem Relation Age of Onset   Heart disease Mother    COPD Mother    Kidney disease Father    Depression Father    Glaucoma Father    Past Surgical History:  Procedure Laterality Date   APPENDECTOMY     SALIVARY GLAND SURGERY     TONSILLECTOMY     Social History   Occupational History   Not on file  Tobacco Use   Smoking status: Former    Current packs/day: 0.00    Average packs/day: 1 pack/day for 30.0 years (30.0 ttl pk-yrs)    Types:  Cigarettes    Start date: 11/03/1981    Quit date: 11/04/2011    Years since quitting: 11.6   Smokeless tobacco: Never  Vaping Use   Vaping status: Never Used  Substance and Sexual Activity   Alcohol use: Yes    Comment: socially   Drug use: Never   Sexual activity: Not Currently

## 2023-06-24 DIAGNOSIS — R339 Retention of urine, unspecified: Secondary | ICD-10-CM | POA: Diagnosis not present

## 2023-07-09 DIAGNOSIS — Z1231 Encounter for screening mammogram for malignant neoplasm of breast: Secondary | ICD-10-CM | POA: Diagnosis not present

## 2023-07-14 DIAGNOSIS — M5412 Radiculopathy, cervical region: Secondary | ICD-10-CM | POA: Diagnosis not present

## 2023-07-14 DIAGNOSIS — M5090 Cervical disc disorder, unspecified, unspecified cervical region: Secondary | ICD-10-CM | POA: Diagnosis not present

## 2023-07-15 ENCOUNTER — Other Ambulatory Visit: Payer: Self-pay | Admitting: Internal Medicine

## 2023-07-15 DIAGNOSIS — M5412 Radiculopathy, cervical region: Secondary | ICD-10-CM

## 2023-07-20 ENCOUNTER — Other Ambulatory Visit: Payer: Self-pay | Admitting: Internal Medicine

## 2023-07-20 DIAGNOSIS — M5412 Radiculopathy, cervical region: Secondary | ICD-10-CM

## 2023-07-24 DIAGNOSIS — Z79890 Hormone replacement therapy (postmenopausal): Secondary | ICD-10-CM | POA: Diagnosis not present

## 2023-07-24 DIAGNOSIS — Z4689 Encounter for fitting and adjustment of other specified devices: Secondary | ICD-10-CM | POA: Diagnosis not present

## 2023-07-24 DIAGNOSIS — R9389 Abnormal findings on diagnostic imaging of other specified body structures: Secondary | ICD-10-CM | POA: Diagnosis not present

## 2023-07-27 DIAGNOSIS — R339 Retention of urine, unspecified: Secondary | ICD-10-CM | POA: Diagnosis not present

## 2023-08-16 ENCOUNTER — Emergency Department (HOSPITAL_BASED_OUTPATIENT_CLINIC_OR_DEPARTMENT_OTHER): Payer: Medicare HMO | Admitting: Radiology

## 2023-08-16 ENCOUNTER — Emergency Department (HOSPITAL_BASED_OUTPATIENT_CLINIC_OR_DEPARTMENT_OTHER)
Admission: EM | Admit: 2023-08-16 | Discharge: 2023-08-16 | Disposition: A | Discharge status: Home / Self Care | Payer: Medicare HMO | Attending: Emergency Medicine | Admitting: Emergency Medicine

## 2023-08-16 ENCOUNTER — Encounter (HOSPITAL_BASED_OUTPATIENT_CLINIC_OR_DEPARTMENT_OTHER): Payer: Self-pay | Admitting: Emergency Medicine

## 2023-08-16 DIAGNOSIS — R0602 Shortness of breath: Secondary | ICD-10-CM | POA: Diagnosis not present

## 2023-08-16 DIAGNOSIS — J441 Chronic obstructive pulmonary disease with (acute) exacerbation: Secondary | ICD-10-CM

## 2023-08-16 DIAGNOSIS — I700 Atherosclerosis of aorta: Secondary | ICD-10-CM | POA: Diagnosis not present

## 2023-08-16 DIAGNOSIS — R059 Cough, unspecified: Secondary | ICD-10-CM | POA: Diagnosis not present

## 2023-08-16 DIAGNOSIS — Z1152 Encounter for screening for COVID-19: Secondary | ICD-10-CM | POA: Diagnosis not present

## 2023-08-16 LAB — CBC WITH DIFFERENTIAL/PLATELET
Abs Immature Granulocytes: 0.07 10*3/uL (ref 0.00–0.07)
Basophils Absolute: 0 10*3/uL (ref 0.0–0.1)
Basophils Relative: 1 %
Eosinophils Absolute: 0.2 10*3/uL (ref 0.0–0.5)
Eosinophils Relative: 2 %
HCT: 38.7 % (ref 36.0–46.0)
Hemoglobin: 13.3 g/dL (ref 12.0–15.0)
Immature Granulocytes: 1 %
Lymphocytes Relative: 23 %
Lymphs Abs: 1.7 10*3/uL (ref 0.7–4.0)
MCH: 31.7 pg (ref 26.0–34.0)
MCHC: 34.4 g/dL (ref 30.0–36.0)
MCV: 92.1 fL (ref 80.0–100.0)
Monocytes Absolute: 0.7 10*3/uL (ref 0.1–1.0)
Monocytes Relative: 9 %
Neutro Abs: 4.7 10*3/uL (ref 1.7–7.7)
Neutrophils Relative %: 64 %
Platelets: 293 10*3/uL (ref 150–400)
RBC: 4.2 MIL/uL (ref 3.87–5.11)
RDW: 12.3 % (ref 11.5–15.5)
WBC: 7.3 10*3/uL (ref 4.0–10.5)
nRBC: 0 % (ref 0.0–0.2)

## 2023-08-16 LAB — BASIC METABOLIC PANEL
Anion gap: 11 (ref 5–15)
BUN: 19 mg/dL (ref 8–23)
CO2: 26 mmol/L (ref 22–32)
Calcium: 9.6 mg/dL (ref 8.9–10.3)
Chloride: 103 mmol/L (ref 98–111)
Creatinine, Ser: 0.83 mg/dL (ref 0.44–1.00)
GFR, Estimated: >60 mL/min (ref >60)
Glucose, Bld: 127 mg/dL — ABNORMAL HIGH (ref 70–99)
Potassium: 3.9 mmol/L (ref 3.5–5.1)
Sodium: 140 mmol/L (ref 135–145)

## 2023-08-16 LAB — RESP PANEL BY RT-PCR (RSV, FLU A&B, COVID)  RVPGX2
Influenza A by PCR: NEGATIVE
Influenza B by PCR: NEGATIVE
Resp Syncytial Virus by PCR: NEGATIVE
SARS Coronavirus 2 by RT PCR: NEGATIVE

## 2023-08-16 LAB — TROPONIN I (HIGH SENSITIVITY): Troponin I (High Sensitivity): 14 ng/L (ref <18)

## 2023-08-16 MED ORDER — IPRATROPIUM-ALBUTEROL 0.5-2.5 (3) MG/3ML IN SOLN
6.0000 mL | Freq: Once | RESPIRATORY_TRACT | Status: AC
  Administered 2023-08-16: 6 mL via RESPIRATORY_TRACT
  Filled 2023-08-16: qty 6

## 2023-08-16 MED ORDER — MAGNESIUM SULFATE IN D5W 1-5 GM/100ML-% IV SOLN
1.0000 g | Freq: Once | INTRAVENOUS | Status: AC
  Administered 2023-08-16: 1 g via INTRAVENOUS
  Filled 2023-08-16: qty 100

## 2023-08-16 MED ORDER — IPRATROPIUM-ALBUTEROL 0.5-2.5 (3) MG/3ML IN SOLN
3.0000 mL | Freq: Once | RESPIRATORY_TRACT | Status: AC
  Administered 2023-08-16: 3 mL via RESPIRATORY_TRACT
  Filled 2023-08-16: qty 3

## 2023-08-16 MED ORDER — PREDNISONE 50 MG PO TABS: 50.0000 mg | ORAL_TABLET | Freq: Every day | ORAL | 0 refills | Status: DC

## 2023-08-16 MED ORDER — METHYLPREDNISOLONE SODIUM SUCC 125 MG IJ SOLR
125.0000 mg | Freq: Once | INTRAMUSCULAR | Status: AC
  Administered 2023-08-16: 125 mg via INTRAVENOUS
  Filled 2023-08-16: qty 2

## 2023-08-16 NOTE — ED Triage Notes (Signed)
Cough and runny nose started Monday. Getting worse. Feels sob, unable to talk in full sentence.

## 2023-08-16 NOTE — ED Notes (Signed)
Pt removed her IV, electrodes, and monitor and stated she is ready to go. Pt stated the Dr can just call her with her results. I asked Pt to please wait for the provider to come talk to her first. Pt agreed for now. Pt is getting more anxious due to wanted to leave. Provider was made aware of Pt intention.

## 2023-08-16 NOTE — ED Provider Notes (Signed)
[place] EMERGENCY DEPARTMENT AT [facility] Provider Note   CSN: [id number] Arrival date & time: 08/16/23  1732     History Chief Complaint  Patient presents with   Shortness of Breath    HPI Stefanie Carter is a 77 y.o. female presenting for acute on chronic shortness of breath.  History of COPD.  States that she has had 5 days of worsening dyspnea on exertion today she was to short of breath to even participate in eating normally. Increased feeding production.  Denies any obvious fever.  Otherwise ambulatory though with dyspnea..   Patient's recorded medical, surgical, social, medication list and allergies were reviewed in the Snapshot window as part of the initial history.   Review of Systems   Review of Systems  Constitutional:  Negative for chills and fever.  HENT:  Negative for ear pain and sore throat.   Eyes:  Negative for pain and visual disturbance.  Respiratory:  Positive for cough, shortness of breath and wheezing.   Cardiovascular:  Negative for chest pain and palpitations.  Gastrointestinal:  Negative for abdominal pain and vomiting.  Genitourinary:  Negative for dysuria and hematuria.  Musculoskeletal:  Negative for arthralgias and back pain.  Skin:  Negative for color change and rash.  Neurological:  Negative for seizures and syncope.  All other systems reviewed and are negative.   Physical Exam Updated Vital Signs BP 132/65 (BP Location: Right Arm)   Pulse (!) 58   Temp 98.4 F (36.9 C) (Oral)   Resp (!) 24   SpO2 92%  Physical Exam Constitutional:      General: She is not in acute distress.    Appearance: She is not ill-appearing or toxic-appearing.  HENT:     Head: Normocephalic and atraumatic.  Eyes:     Extraocular Movements: Extraocular movements intact.     Pupils: Pupils are equal, round, and reactive to light.  Cardiovascular:     Rate and Rhythm: Normal rate.  Pulmonary:     Effort: No respiratory distress.     Breath  sounds: Wheezing present.  Abdominal:     General: Abdomen is flat.  Musculoskeletal:        General: No swelling, deformity or signs of injury.     Cervical back: Normal range of motion. No rigidity.  Skin:    General: Skin is warm and dry.  Neurological:     General: No focal deficit present.     Mental Status: She is alert and oriented to person, place, and time.  Psychiatric:        Mood and Affect: Mood normal.      ED Course/ Medical Decision Making/ A&P    Procedures Procedures   Medications Ordered in ED Medications  ipratropium-albuterol (DUONEB) 0.5-2.5 (3) MG/3ML nebulizer solution 3 mL (3 mLs Nebulization Given 08/16/23 1758)  ipratropium-albuterol (DUONEB) 0.5-2.5 (3) MG/3ML nebulizer solution 6 mL (6 mLs Nebulization Given 08/16/23 1825)  methylPREDNISolone sodium succinate (SOLU-MEDROL) 125 mg/2 mL injection 125 mg (125 mg Intravenous Given 08/16/23 1840)  magnesium sulfate IVPB 1 g 100 mL (0 g Intravenous Stopped 08/16/23 1953)   Medical Decision Making:   [name] is a 77 y.o. female with a history of COPD, who presented to the ED today with acute on chronic SOB. They are endorsing worsening of their baseline dyspnea over the past 48 hours. Their baseline is a 0L O2 requirement. At their baseline they are able to get around the neighborhood and they are not able  to at this time.   On my initial exam, the pt was SOB and tachypneic. Audible wheezing and grossly decreased breath sounds appreciated.  They are endorsing increased sputum production.    Reviewed and confirmed nursing documentation for past medical history, family history, social history.    Initial Assessment:   With the patient's presentation of SOB in the above setting, most likely diagnosis is COPD Exacerbation. Other diagnoses were considered including (but not limited to) CAP, PE, ACS, viral infection, PTX. These are considered less likely due to history of present illness and physical  exam findings.   This is most consistent with an acute life/limb threatening illness complicated by underlying chronic conditions.  Initial Plan:  Empiric treatment of patient's symptoms with immediate initiation of inhaled bronchodilators and IV steroids. Given advanced nature of patient's presentation, will proceed with IV magnesium as a rescue therapy.   Evaluation for ACS with EKG and delta troponin  Evaluation for infectious versus intrathoracic abnormality with chest x-ray  Evaluation for volume overload with BNP  Screening labs including CBC and Metabolic panel to evaluate for infectious or metabolic etiology of disease.  Patient's Wells score is low and patient does not warrant further objective evaluation for PE based on consistency of presentation of alternative diagnosis.  Objective evaluation as below reviewed   Initial Study Results:   Laboratory  All laboratory results reviewed without evidence of clinically relevant pathology.     EKG EKG was reviewed independently. Rate, rhythm, axis, intervals all examined and without medically relevant abnormality. ST segments without concerns for elevations.    Radiology:  All images reviewed independently. Agree with radiology report at this time.   DG Chest Port 1 View  Result Date: 08/16/2023 CLINICAL DATA:  Shortness of breath and cough EXAM: PORTABLE CHEST 1 VIEW COMPARISON:  01/02/2022 FINDINGS: Check shadow is stable. Aortic calcifications are again seen. Lungs are well aerated bilaterally. Mild scarring in the left base is seen. No bony abnormality is noted. IMPRESSION: No active disease. Electronically Signed   By: Alcide Clever M.D.   On: 08/16/2023 20:02     Final Assessment and Plan:   After initiation of medical therapies, patient is grossly improved and no longer in acute distress.   Interestingly, I saw patient walking out of the emergency department. I asked her to stay and let me to reassess her and she stated that she  has felt completely better and would like to be discharged immediately as she packed her purse. Given her understanding of risk of missed disease I do believe patient can be managed in the outpatient setting for COPD.  Will send steroid.  No white count no consolidation on x-ray so no need for antibiotics.  She has breathing treatments at home which I encouraged her to use.  Given her improvement in syndrome patient discharged without further acute events.  Disposition:  I have considered need for hospitalization, however, considering all of the above, I believe this patient is stable for discharge at this time.  Patient/family educated about specific return precautions for given chief complaint and symptoms.  Patient/family educated about follow-up with PCP.     Patient/family expressed understanding of return precautions and need for follow-up. Patient spoken to regarding all imaging and laboratory results and appropriate follow up for these results. All education provided in verbal form with additional information in written form. Time was allowed for answering of patient questions. Patient discharged.    Emergency Department Medication Summary:   Medications  ipratropium-albuterol (DUONEB) 0.5-2.5 (3) MG/3ML nebulizer solution 3 mL (3 mLs Nebulization Given 08/16/23 1758)  ipratropium-albuterol (DUONEB) 0.5-2.5 (3) MG/3ML nebulizer solution 6 mL (6 mLs Nebulization Given 08/16/23 1825)  methylPREDNISolone sodium succinate (SOLU-MEDROL) 125 mg/2 mL injection 125 mg (125 mg Intravenous Given 08/16/23 1840)  magnesium sulfate IVPB 1 g 100 mL (0 g Intravenous Stopped 08/16/23 1953)        Clinical Impression:  1. COPD exacerbation [facility])      Discharge   Final Clinical Impression(s) / ED Diagnoses Final diagnoses:  COPD exacerbation (HCC)    Rx / DC Orders ED Discharge Orders          Ordered    predniSONE (DELTASONE) 50 MG tablet  Daily        08/16/23 2108               Glyn Ade, MD 08/16/23 2309

## 2023-08-17 DIAGNOSIS — J441 Chronic obstructive pulmonary disease with (acute) exacerbation: Secondary | ICD-10-CM | POA: Diagnosis not present

## 2023-08-18 ENCOUNTER — Ambulatory Visit
Admission: RE | Admit: 2023-08-18 | Discharge: 2023-08-18 | Disposition: A | Discharge status: Home / Self Care | Payer: Medicare HMO | Source: Ambulatory Visit | Attending: Internal Medicine | Admitting: Internal Medicine

## 2023-08-18 DIAGNOSIS — M5412 Radiculopathy, cervical region: Secondary | ICD-10-CM

## 2023-08-18 DIAGNOSIS — M47816 Spondylosis without myelopathy or radiculopathy, lumbar region: Secondary | ICD-10-CM | POA: Diagnosis not present

## 2023-08-18 DIAGNOSIS — M4802 Spinal stenosis, cervical region: Secondary | ICD-10-CM | POA: Diagnosis not present

## 2023-08-19 DIAGNOSIS — J441 Chronic obstructive pulmonary disease with (acute) exacerbation: Secondary | ICD-10-CM | POA: Diagnosis not present

## 2023-08-26 DIAGNOSIS — M5412 Radiculopathy, cervical region: Secondary | ICD-10-CM | POA: Diagnosis not present

## 2023-08-26 DIAGNOSIS — M5090 Cervical disc disorder, unspecified, unspecified cervical region: Secondary | ICD-10-CM | POA: Diagnosis not present

## 2023-08-26 DIAGNOSIS — R269 Unspecified abnormalities of gait and mobility: Secondary | ICD-10-CM | POA: Diagnosis not present

## 2023-08-26 DIAGNOSIS — J441 Chronic obstructive pulmonary disease with (acute) exacerbation: Secondary | ICD-10-CM | POA: Diagnosis not present

## 2023-09-02 DIAGNOSIS — M4800 Spinal stenosis, site unspecified: Secondary | ICD-10-CM | POA: Diagnosis not present

## 2023-09-02 DIAGNOSIS — J441 Chronic obstructive pulmonary disease with (acute) exacerbation: Secondary | ICD-10-CM | POA: Diagnosis not present

## 2023-09-02 DIAGNOSIS — I7300 Raynaud's syndrome without gangrene: Secondary | ICD-10-CM | POA: Diagnosis not present

## 2023-09-02 DIAGNOSIS — I700 Atherosclerosis of aorta: Secondary | ICD-10-CM | POA: Diagnosis not present

## 2023-09-02 DIAGNOSIS — I10 Essential (primary) hypertension: Secondary | ICD-10-CM | POA: Diagnosis not present

## 2023-09-02 DIAGNOSIS — M4722 Other spondylosis with radiculopathy, cervical region: Secondary | ICD-10-CM | POA: Diagnosis not present

## 2023-09-02 DIAGNOSIS — E039 Hypothyroidism, unspecified: Secondary | ICD-10-CM | POA: Diagnosis not present

## 2023-09-02 DIAGNOSIS — M47816 Spondylosis without myelopathy or radiculopathy, lumbar region: Secondary | ICD-10-CM | POA: Diagnosis not present

## 2023-09-02 DIAGNOSIS — M5010 Cervical disc disorder with radiculopathy, unspecified cervical region: Secondary | ICD-10-CM | POA: Diagnosis not present

## 2023-09-10 DIAGNOSIS — M5010 Cervical disc disorder with radiculopathy, unspecified cervical region: Secondary | ICD-10-CM | POA: Diagnosis not present

## 2023-09-10 DIAGNOSIS — E039 Hypothyroidism, unspecified: Secondary | ICD-10-CM | POA: Diagnosis not present

## 2023-09-10 DIAGNOSIS — M47816 Spondylosis without myelopathy or radiculopathy, lumbar region: Secondary | ICD-10-CM | POA: Diagnosis not present

## 2023-09-10 DIAGNOSIS — I7300 Raynaud's syndrome without gangrene: Secondary | ICD-10-CM | POA: Diagnosis not present

## 2023-09-10 DIAGNOSIS — I10 Essential (primary) hypertension: Secondary | ICD-10-CM | POA: Diagnosis not present

## 2023-09-10 DIAGNOSIS — M4722 Other spondylosis with radiculopathy, cervical region: Secondary | ICD-10-CM | POA: Diagnosis not present

## 2023-09-10 DIAGNOSIS — J441 Chronic obstructive pulmonary disease with (acute) exacerbation: Secondary | ICD-10-CM | POA: Diagnosis not present

## 2023-09-10 DIAGNOSIS — M4800 Spinal stenosis, site unspecified: Secondary | ICD-10-CM | POA: Diagnosis not present

## 2023-09-10 DIAGNOSIS — I700 Atherosclerosis of aorta: Secondary | ICD-10-CM | POA: Diagnosis not present

## 2023-09-14 DIAGNOSIS — I700 Atherosclerosis of aorta: Secondary | ICD-10-CM | POA: Diagnosis not present

## 2023-09-14 DIAGNOSIS — E039 Hypothyroidism, unspecified: Secondary | ICD-10-CM | POA: Diagnosis not present

## 2023-09-14 DIAGNOSIS — M5010 Cervical disc disorder with radiculopathy, unspecified cervical region: Secondary | ICD-10-CM | POA: Diagnosis not present

## 2023-09-14 DIAGNOSIS — I7300 Raynaud's syndrome without gangrene: Secondary | ICD-10-CM | POA: Diagnosis not present

## 2023-09-14 DIAGNOSIS — M47816 Spondylosis without myelopathy or radiculopathy, lumbar region: Secondary | ICD-10-CM | POA: Diagnosis not present

## 2023-09-14 DIAGNOSIS — J441 Chronic obstructive pulmonary disease with (acute) exacerbation: Secondary | ICD-10-CM | POA: Diagnosis not present

## 2023-09-14 DIAGNOSIS — M4800 Spinal stenosis, site unspecified: Secondary | ICD-10-CM | POA: Diagnosis not present

## 2023-09-14 DIAGNOSIS — I10 Essential (primary) hypertension: Secondary | ICD-10-CM | POA: Diagnosis not present

## 2023-09-14 DIAGNOSIS — M4722 Other spondylosis with radiculopathy, cervical region: Secondary | ICD-10-CM | POA: Diagnosis not present

## 2023-09-15 DIAGNOSIS — I700 Atherosclerosis of aorta: Secondary | ICD-10-CM | POA: Diagnosis not present

## 2023-09-15 DIAGNOSIS — M4722 Other spondylosis with radiculopathy, cervical region: Secondary | ICD-10-CM | POA: Diagnosis not present

## 2023-09-15 DIAGNOSIS — M47816 Spondylosis without myelopathy or radiculopathy, lumbar region: Secondary | ICD-10-CM | POA: Diagnosis not present

## 2023-09-15 DIAGNOSIS — E039 Hypothyroidism, unspecified: Secondary | ICD-10-CM | POA: Diagnosis not present

## 2023-09-15 DIAGNOSIS — I10 Essential (primary) hypertension: Secondary | ICD-10-CM | POA: Diagnosis not present

## 2023-09-15 DIAGNOSIS — I7300 Raynaud's syndrome without gangrene: Secondary | ICD-10-CM | POA: Diagnosis not present

## 2023-09-15 DIAGNOSIS — J441 Chronic obstructive pulmonary disease with (acute) exacerbation: Secondary | ICD-10-CM | POA: Diagnosis not present

## 2023-09-15 DIAGNOSIS — M4800 Spinal stenosis, site unspecified: Secondary | ICD-10-CM | POA: Diagnosis not present

## 2023-09-15 DIAGNOSIS — M5010 Cervical disc disorder with radiculopathy, unspecified cervical region: Secondary | ICD-10-CM | POA: Diagnosis not present

## 2023-09-17 DIAGNOSIS — M542 Cervicalgia: Secondary | ICD-10-CM | POA: Diagnosis not present

## 2023-09-17 DIAGNOSIS — M5442 Lumbago with sciatica, left side: Secondary | ICD-10-CM | POA: Diagnosis not present

## 2023-09-18 DIAGNOSIS — J441 Chronic obstructive pulmonary disease with (acute) exacerbation: Secondary | ICD-10-CM | POA: Diagnosis not present

## 2023-09-18 DIAGNOSIS — M4800 Spinal stenosis, site unspecified: Secondary | ICD-10-CM | POA: Diagnosis not present

## 2023-09-18 DIAGNOSIS — I10 Essential (primary) hypertension: Secondary | ICD-10-CM | POA: Diagnosis not present

## 2023-09-18 DIAGNOSIS — I7300 Raynaud's syndrome without gangrene: Secondary | ICD-10-CM | POA: Diagnosis not present

## 2023-09-18 DIAGNOSIS — M47816 Spondylosis without myelopathy or radiculopathy, lumbar region: Secondary | ICD-10-CM | POA: Diagnosis not present

## 2023-09-18 DIAGNOSIS — E039 Hypothyroidism, unspecified: Secondary | ICD-10-CM | POA: Diagnosis not present

## 2023-09-18 DIAGNOSIS — M4722 Other spondylosis with radiculopathy, cervical region: Secondary | ICD-10-CM | POA: Diagnosis not present

## 2023-09-18 DIAGNOSIS — M5010 Cervical disc disorder with radiculopathy, unspecified cervical region: Secondary | ICD-10-CM | POA: Diagnosis not present

## 2023-09-18 DIAGNOSIS — I700 Atherosclerosis of aorta: Secondary | ICD-10-CM | POA: Diagnosis not present

## 2023-09-21 DIAGNOSIS — R339 Retention of urine, unspecified: Secondary | ICD-10-CM | POA: Diagnosis not present

## 2023-09-23 DIAGNOSIS — E559 Vitamin D deficiency, unspecified: Secondary | ICD-10-CM | POA: Diagnosis not present

## 2023-09-23 DIAGNOSIS — I10 Essential (primary) hypertension: Secondary | ICD-10-CM | POA: Diagnosis not present

## 2023-09-23 DIAGNOSIS — M8580 Other specified disorders of bone density and structure, unspecified site: Secondary | ICD-10-CM | POA: Diagnosis not present

## 2023-09-23 DIAGNOSIS — E782 Mixed hyperlipidemia: Secondary | ICD-10-CM | POA: Diagnosis not present

## 2023-09-23 DIAGNOSIS — Z1331 Encounter for screening for depression: Secondary | ICD-10-CM | POA: Diagnosis not present

## 2023-09-23 DIAGNOSIS — M5416 Radiculopathy, lumbar region: Secondary | ICD-10-CM | POA: Diagnosis not present

## 2023-09-23 DIAGNOSIS — E039 Hypothyroidism, unspecified: Secondary | ICD-10-CM | POA: Diagnosis not present

## 2023-09-23 DIAGNOSIS — R7303 Prediabetes: Secondary | ICD-10-CM | POA: Diagnosis not present

## 2023-09-23 DIAGNOSIS — J449 Chronic obstructive pulmonary disease, unspecified: Secondary | ICD-10-CM | POA: Diagnosis not present

## 2023-09-23 DIAGNOSIS — M797 Fibromyalgia: Secondary | ICD-10-CM | POA: Diagnosis not present

## 2023-09-23 DIAGNOSIS — Z0000 Encounter for general adult medical examination without abnormal findings: Secondary | ICD-10-CM | POA: Diagnosis not present

## 2023-09-25 DIAGNOSIS — E039 Hypothyroidism, unspecified: Secondary | ICD-10-CM | POA: Diagnosis not present

## 2023-09-25 DIAGNOSIS — M4722 Other spondylosis with radiculopathy, cervical region: Secondary | ICD-10-CM | POA: Diagnosis not present

## 2023-09-25 DIAGNOSIS — J441 Chronic obstructive pulmonary disease with (acute) exacerbation: Secondary | ICD-10-CM | POA: Diagnosis not present

## 2023-09-25 DIAGNOSIS — M5010 Cervical disc disorder with radiculopathy, unspecified cervical region: Secondary | ICD-10-CM | POA: Diagnosis not present

## 2023-09-25 DIAGNOSIS — M4800 Spinal stenosis, site unspecified: Secondary | ICD-10-CM | POA: Diagnosis not present

## 2023-09-25 DIAGNOSIS — I700 Atherosclerosis of aorta: Secondary | ICD-10-CM | POA: Diagnosis not present

## 2023-09-25 DIAGNOSIS — M47816 Spondylosis without myelopathy or radiculopathy, lumbar region: Secondary | ICD-10-CM | POA: Diagnosis not present

## 2023-09-25 DIAGNOSIS — I10 Essential (primary) hypertension: Secondary | ICD-10-CM | POA: Diagnosis not present

## 2023-09-25 DIAGNOSIS — I7300 Raynaud's syndrome without gangrene: Secondary | ICD-10-CM | POA: Diagnosis not present

## 2023-09-28 ENCOUNTER — Encounter: Payer: Self-pay | Admitting: Internal Medicine

## 2023-09-28 ENCOUNTER — Other Ambulatory Visit: Payer: Self-pay | Admitting: Internal Medicine

## 2023-09-28 DIAGNOSIS — M5416 Radiculopathy, lumbar region: Secondary | ICD-10-CM

## 2023-09-29 ENCOUNTER — Other Ambulatory Visit: Payer: Self-pay | Admitting: Internal Medicine

## 2023-09-29 DIAGNOSIS — M5416 Radiculopathy, lumbar region: Secondary | ICD-10-CM

## 2023-09-29 DIAGNOSIS — M5090 Cervical disc disorder, unspecified, unspecified cervical region: Secondary | ICD-10-CM

## 2023-10-01 ENCOUNTER — Encounter: Payer: Self-pay | Admitting: Internal Medicine

## 2023-10-02 ENCOUNTER — Other Ambulatory Visit: Payer: Medicare HMO

## 2023-10-02 DIAGNOSIS — E039 Hypothyroidism, unspecified: Secondary | ICD-10-CM | POA: Diagnosis not present

## 2023-10-02 DIAGNOSIS — I10 Essential (primary) hypertension: Secondary | ICD-10-CM | POA: Diagnosis not present

## 2023-10-02 DIAGNOSIS — J441 Chronic obstructive pulmonary disease with (acute) exacerbation: Secondary | ICD-10-CM | POA: Diagnosis not present

## 2023-10-02 DIAGNOSIS — M5010 Cervical disc disorder with radiculopathy, unspecified cervical region: Secondary | ICD-10-CM | POA: Diagnosis not present

## 2023-10-02 DIAGNOSIS — I700 Atherosclerosis of aorta: Secondary | ICD-10-CM | POA: Diagnosis not present

## 2023-10-02 DIAGNOSIS — I7300 Raynaud's syndrome without gangrene: Secondary | ICD-10-CM | POA: Diagnosis not present

## 2023-10-02 DIAGNOSIS — M47816 Spondylosis without myelopathy or radiculopathy, lumbar region: Secondary | ICD-10-CM | POA: Diagnosis not present

## 2023-10-02 DIAGNOSIS — M4722 Other spondylosis with radiculopathy, cervical region: Secondary | ICD-10-CM | POA: Diagnosis not present

## 2023-10-02 DIAGNOSIS — M4800 Spinal stenosis, site unspecified: Secondary | ICD-10-CM | POA: Diagnosis not present

## 2023-10-07 NOTE — Discharge Instructions (Signed)

## 2023-10-08 ENCOUNTER — Ambulatory Visit
Admission: RE | Admit: 2023-10-08 | Discharge: 2023-10-08 | Disposition: A | Discharge status: Home / Self Care | Payer: Medicare HMO | Source: Ambulatory Visit | Attending: Internal Medicine | Admitting: Internal Medicine

## 2023-10-08 DIAGNOSIS — M5090 Cervical disc disorder, unspecified, unspecified cervical region: Secondary | ICD-10-CM

## 2023-10-08 DIAGNOSIS — M4722 Other spondylosis with radiculopathy, cervical region: Secondary | ICD-10-CM | POA: Diagnosis not present

## 2023-10-08 MED ORDER — IOPAMIDOL (ISOVUE-M 300) INJECTION 61%
1.0000 mL | Freq: Once | INTRAMUSCULAR | Status: AC | PRN
Start: 2023-10-08 — End: 2023-10-08
  Administered 2023-10-08: 1 mL via EPIDURAL

## 2023-10-08 MED ORDER — TRIAMCINOLONE ACETONIDE 40 MG/ML IJ SUSP (RADIOLOGY)
60.0000 mg | Freq: Once | INTRAMUSCULAR | Status: AC
  Administered 2023-10-08: 60 mg via EPIDURAL

## 2023-10-14 DIAGNOSIS — M75101 Unspecified rotator cuff tear or rupture of right shoulder, not specified as traumatic: Secondary | ICD-10-CM | POA: Diagnosis not present

## 2023-10-14 DIAGNOSIS — M25511 Pain in right shoulder: Secondary | ICD-10-CM | POA: Diagnosis not present

## 2023-10-15 DIAGNOSIS — I7300 Raynaud's syndrome without gangrene: Secondary | ICD-10-CM | POA: Diagnosis not present

## 2023-10-15 DIAGNOSIS — M47816 Spondylosis without myelopathy or radiculopathy, lumbar region: Secondary | ICD-10-CM | POA: Diagnosis not present

## 2023-10-15 DIAGNOSIS — M4800 Spinal stenosis, site unspecified: Secondary | ICD-10-CM | POA: Diagnosis not present

## 2023-10-15 DIAGNOSIS — E039 Hypothyroidism, unspecified: Secondary | ICD-10-CM | POA: Diagnosis not present

## 2023-10-15 DIAGNOSIS — H52223 Regular astigmatism, bilateral: Secondary | ICD-10-CM | POA: Diagnosis not present

## 2023-10-15 DIAGNOSIS — H5203 Hypermetropia, bilateral: Secondary | ICD-10-CM | POA: Diagnosis not present

## 2023-10-15 DIAGNOSIS — I10 Essential (primary) hypertension: Secondary | ICD-10-CM | POA: Diagnosis not present

## 2023-10-15 DIAGNOSIS — H524 Presbyopia: Secondary | ICD-10-CM | POA: Diagnosis not present

## 2023-10-15 DIAGNOSIS — H43813 Vitreous degeneration, bilateral: Secondary | ICD-10-CM | POA: Diagnosis not present

## 2023-10-15 DIAGNOSIS — M5010 Cervical disc disorder with radiculopathy, unspecified cervical region: Secondary | ICD-10-CM | POA: Diagnosis not present

## 2023-10-15 DIAGNOSIS — J441 Chronic obstructive pulmonary disease with (acute) exacerbation: Secondary | ICD-10-CM | POA: Diagnosis not present

## 2023-10-15 DIAGNOSIS — Z961 Presence of intraocular lens: Secondary | ICD-10-CM | POA: Diagnosis not present

## 2023-10-15 DIAGNOSIS — M4722 Other spondylosis with radiculopathy, cervical region: Secondary | ICD-10-CM | POA: Diagnosis not present

## 2023-10-15 DIAGNOSIS — H26492 Other secondary cataract, left eye: Secondary | ICD-10-CM | POA: Diagnosis not present

## 2023-10-15 DIAGNOSIS — I700 Atherosclerosis of aorta: Secondary | ICD-10-CM | POA: Diagnosis not present

## 2023-10-22 DIAGNOSIS — R339 Retention of urine, unspecified: Secondary | ICD-10-CM | POA: Diagnosis not present

## 2023-11-07 ENCOUNTER — Emergency Department (HOSPITAL_COMMUNITY): Payer: Medicare HMO

## 2023-11-07 ENCOUNTER — Other Ambulatory Visit: Payer: Self-pay

## 2023-11-07 ENCOUNTER — Inpatient Hospital Stay (HOSPITAL_COMMUNITY)
Admission: EM | Admit: 2023-11-07 | Discharge: 2023-11-09 | DRG: 065 | Disposition: A | Discharge status: Home Health | Payer: Medicare HMO | Attending: Internal Medicine | Admitting: Internal Medicine

## 2023-11-07 ENCOUNTER — Encounter (HOSPITAL_COMMUNITY): Payer: Self-pay

## 2023-11-07 ENCOUNTER — Inpatient Hospital Stay (HOSPITAL_COMMUNITY): Payer: Medicare HMO

## 2023-11-07 DIAGNOSIS — Z880 Allergy status to penicillin: Secondary | ICD-10-CM

## 2023-11-07 DIAGNOSIS — Z818 Family history of other mental and behavioral disorders: Secondary | ICD-10-CM | POA: Diagnosis not present

## 2023-11-07 DIAGNOSIS — Z79899 Other long term (current) drug therapy: Secondary | ICD-10-CM

## 2023-11-07 DIAGNOSIS — M47812 Spondylosis without myelopathy or radiculopathy, cervical region: Secondary | ICD-10-CM | POA: Diagnosis present

## 2023-11-07 DIAGNOSIS — W19XXXA Unspecified fall, initial encounter: Secondary | ICD-10-CM | POA: Diagnosis present

## 2023-11-07 DIAGNOSIS — I499 Cardiac arrhythmia, unspecified: Secondary | ICD-10-CM | POA: Diagnosis not present

## 2023-11-07 DIAGNOSIS — I639 Cerebral infarction, unspecified: Secondary | ICD-10-CM | POA: Diagnosis not present

## 2023-11-07 DIAGNOSIS — E1142 Type 2 diabetes mellitus with diabetic polyneuropathy: Secondary | ICD-10-CM | POA: Diagnosis not present

## 2023-11-07 DIAGNOSIS — E039 Hypothyroidism, unspecified: Secondary | ICD-10-CM | POA: Diagnosis not present

## 2023-11-07 DIAGNOSIS — L03031 Cellulitis of right toe: Secondary | ICD-10-CM | POA: Diagnosis not present

## 2023-11-07 DIAGNOSIS — J449 Chronic obstructive pulmonary disease, unspecified: Secondary | ICD-10-CM | POA: Diagnosis not present

## 2023-11-07 DIAGNOSIS — Z79890 Hormone replacement therapy (postmenopausal): Secondary | ICD-10-CM

## 2023-11-07 DIAGNOSIS — E876 Hypokalemia: Secondary | ICD-10-CM | POA: Diagnosis not present

## 2023-11-07 DIAGNOSIS — Y92009 Unspecified place in unspecified non-institutional (private) residence as the place of occurrence of the external cause: Secondary | ICD-10-CM | POA: Diagnosis not present

## 2023-11-07 DIAGNOSIS — M19071 Primary osteoarthritis, right ankle and foot: Secondary | ICD-10-CM | POA: Diagnosis not present

## 2023-11-07 DIAGNOSIS — Z8249 Family history of ischemic heart disease and other diseases of the circulatory system: Secondary | ICD-10-CM

## 2023-11-07 DIAGNOSIS — Z87891 Personal history of nicotine dependence: Secondary | ICD-10-CM

## 2023-11-07 DIAGNOSIS — R270 Ataxia, unspecified: Secondary | ICD-10-CM | POA: Diagnosis present

## 2023-11-07 DIAGNOSIS — I6523 Occlusion and stenosis of bilateral carotid arteries: Secondary | ICD-10-CM | POA: Diagnosis not present

## 2023-11-07 DIAGNOSIS — E119 Type 2 diabetes mellitus without complications: Secondary | ICD-10-CM

## 2023-11-07 DIAGNOSIS — Z882 Allergy status to sulfonamides status: Secondary | ICD-10-CM

## 2023-11-07 DIAGNOSIS — M4802 Spinal stenosis, cervical region: Secondary | ICD-10-CM | POA: Diagnosis present

## 2023-11-07 DIAGNOSIS — I63542 Cerebral infarction due to unspecified occlusion or stenosis of left cerebellar artery: Principal | ICD-10-CM | POA: Diagnosis present

## 2023-11-07 DIAGNOSIS — R29700 NIHSS score 0: Secondary | ICD-10-CM | POA: Diagnosis present

## 2023-11-07 DIAGNOSIS — I6782 Cerebral ischemia: Secondary | ICD-10-CM | POA: Diagnosis not present

## 2023-11-07 DIAGNOSIS — L97509 Non-pressure chronic ulcer of other part of unspecified foot with unspecified severity: Secondary | ICD-10-CM | POA: Diagnosis not present

## 2023-11-07 DIAGNOSIS — I6389 Other cerebral infarction: Secondary | ICD-10-CM | POA: Diagnosis not present

## 2023-11-07 DIAGNOSIS — I6502 Occlusion and stenosis of left vertebral artery: Secondary | ICD-10-CM | POA: Diagnosis not present

## 2023-11-07 DIAGNOSIS — L03115 Cellulitis of right lower limb: Secondary | ICD-10-CM | POA: Diagnosis present

## 2023-11-07 DIAGNOSIS — R42 Dizziness and giddiness: Secondary | ICD-10-CM | POA: Diagnosis not present

## 2023-11-07 DIAGNOSIS — Z8709 Personal history of other diseases of the respiratory system: Secondary | ICD-10-CM | POA: Diagnosis not present

## 2023-11-07 DIAGNOSIS — G319 Degenerative disease of nervous system, unspecified: Secondary | ICD-10-CM | POA: Diagnosis not present

## 2023-11-07 DIAGNOSIS — Z825 Family history of asthma and other chronic lower respiratory diseases: Secondary | ICD-10-CM

## 2023-11-07 DIAGNOSIS — E785 Hyperlipidemia, unspecified: Secondary | ICD-10-CM | POA: Diagnosis not present

## 2023-11-07 DIAGNOSIS — R059 Cough, unspecified: Secondary | ICD-10-CM | POA: Diagnosis not present

## 2023-11-07 DIAGNOSIS — R0902 Hypoxemia: Secondary | ICD-10-CM | POA: Diagnosis not present

## 2023-11-07 DIAGNOSIS — I10 Essential (primary) hypertension: Secondary | ICD-10-CM | POA: Diagnosis not present

## 2023-11-07 DIAGNOSIS — F411 Generalized anxiety disorder: Secondary | ICD-10-CM | POA: Diagnosis present

## 2023-11-07 DIAGNOSIS — Z888 Allergy status to other drugs, medicaments and biological substances status: Secondary | ICD-10-CM

## 2023-11-07 DIAGNOSIS — Z7952 Long term (current) use of systemic steroids: Secondary | ICD-10-CM

## 2023-11-07 DIAGNOSIS — Z885 Allergy status to narcotic agent status: Secondary | ICD-10-CM | POA: Diagnosis not present

## 2023-11-07 DIAGNOSIS — Z7984 Long term (current) use of oral hypoglycemic drugs: Secondary | ICD-10-CM | POA: Diagnosis not present

## 2023-11-07 DIAGNOSIS — R55 Syncope and collapse: Secondary | ICD-10-CM | POA: Diagnosis not present

## 2023-11-07 DIAGNOSIS — E860 Dehydration: Secondary | ICD-10-CM | POA: Diagnosis present

## 2023-11-07 DIAGNOSIS — I700 Atherosclerosis of aorta: Secondary | ICD-10-CM | POA: Diagnosis not present

## 2023-11-07 DIAGNOSIS — R000 Tachycardia, unspecified: Secondary | ICD-10-CM | POA: Diagnosis not present

## 2023-11-07 DIAGNOSIS — E782 Mixed hyperlipidemia: Secondary | ICD-10-CM | POA: Diagnosis not present

## 2023-11-07 DIAGNOSIS — Z8673 Personal history of transient ischemic attack (TIA), and cerebral infarction without residual deficits: Secondary | ICD-10-CM | POA: Diagnosis not present

## 2023-11-07 HISTORY — DX: Cerebral infarction, unspecified: I63.9

## 2023-11-07 LAB — BASIC METABOLIC PANEL
Anion gap: 12 (ref 5–15)
BUN: 17 mg/dL (ref 8–23)
CO2: 23 mmol/L (ref 22–32)
Calcium: 9.9 mg/dL (ref 8.9–10.3)
Chloride: 105 mmol/L (ref 98–111)
Creatinine, Ser: 0.62 mg/dL (ref 0.44–1.00)
GFR, Estimated: >60 mL/min (ref >60)
Glucose, Bld: 126 mg/dL — ABNORMAL HIGH (ref 70–99)
Potassium: 3.4 mmol/L — ABNORMAL LOW (ref 3.5–5.1)
Sodium: 140 mmol/L (ref 135–145)

## 2023-11-07 LAB — URINALYSIS, ROUTINE W REFLEX MICROSCOPIC
Bilirubin Urine: NEGATIVE
Glucose, UA: NEGATIVE mg/dL
Hgb urine dipstick: NEGATIVE
Ketones, ur: 5 mg/dL — AB
Leukocytes,Ua: NEGATIVE
Nitrite: NEGATIVE
Protein, ur: NEGATIVE mg/dL
Specific Gravity, Urine: 1.013 (ref 1.005–1.030)
pH: 6 (ref 5.0–8.0)

## 2023-11-07 LAB — CBC
HCT: 46.5 % — ABNORMAL HIGH (ref 36.0–46.0)
Hemoglobin: 16.1 g/dL — ABNORMAL HIGH (ref 12.0–15.0)
MCH: 32.1 pg (ref 26.0–34.0)
MCHC: 34.6 g/dL (ref 30.0–36.0)
MCV: 92.8 fL (ref 80.0–100.0)
Platelets: 311 10*3/uL (ref 150–400)
RBC: 5.01 MIL/uL (ref 3.87–5.11)
RDW: 12.4 % (ref 11.5–15.5)
WBC: 10.7 10*3/uL — ABNORMAL HIGH (ref 4.0–10.5)
nRBC: 0 % (ref 0.0–0.2)

## 2023-11-07 LAB — RESP PANEL BY RT-PCR (RSV, FLU A&B, COVID)  RVPGX2
Influenza A by PCR: NEGATIVE
Influenza B by PCR: NEGATIVE
Resp Syncytial Virus by PCR: NEGATIVE
SARS Coronavirus 2 by RT PCR: NEGATIVE

## 2023-11-07 LAB — CBG MONITORING, ED: Glucose-Capillary: 133 mg/dL — ABNORMAL HIGH (ref 70–99)

## 2023-11-07 MED ORDER — MECLIZINE HCL 25 MG PO TABS
25.0000 mg | ORAL_TABLET | Freq: Once | ORAL | Status: AC
  Administered 2023-11-07: 25 mg via ORAL
  Filled 2023-11-07: qty 1

## 2023-11-07 MED ORDER — SODIUM CHLORIDE 0.9 % IV BOLUS
1000.0000 mL | Freq: Once | INTRAVENOUS | Status: AC
  Administered 2023-11-07: 1000 mL via INTRAVENOUS

## 2023-11-07 MED ORDER — ONDANSETRON HCL 4 MG/2ML IJ SOLN
4.0000 mg | Freq: Once | INTRAMUSCULAR | Status: AC
  Administered 2023-11-07: 4 mg via INTRAVENOUS
  Filled 2023-11-07: qty 2

## 2023-11-07 MED ORDER — POTASSIUM CHLORIDE 20 MEQ PO PACK
40.0000 meq | PACK | Freq: Once | ORAL | Status: DC
  Filled 2023-11-07: qty 2

## 2023-11-07 MED ORDER — BUDESON-GLYCOPYRROL-FORMOTEROL 160-9-4.8 MCG/ACT IN AERO: 2.0000 | INHALATION_SPRAY | Freq: Two times a day (BID) | RESPIRATORY_TRACT | Status: DC

## 2023-11-07 MED ORDER — LORAZEPAM 0.5 MG PO TABS
0.5000 mg | ORAL_TABLET | Freq: Three times a day (TID) | ORAL | Status: DC | PRN
  Administered 2023-11-07 – 2023-11-08 (×3): 0.5 mg via ORAL
  Filled 2023-11-07 (×3): qty 1

## 2023-11-07 MED ORDER — ASPIRIN 325 MG PO TABS
325.0000 mg | ORAL_TABLET | Freq: Once | ORAL | Status: AC
  Administered 2023-11-07: 325 mg via ORAL
  Filled 2023-11-07: qty 1

## 2023-11-07 MED ORDER — GABAPENTIN 100 MG PO CAPS
100.0000 mg | ORAL_CAPSULE | Freq: Three times a day (TID) | ORAL | Status: DC
  Administered 2023-11-07 – 2023-11-08 (×2): 100 mg via ORAL
  Filled 2023-11-07 (×5): qty 1

## 2023-11-07 MED ORDER — MELATONIN 3 MG PO TABS
3.0000 mg | ORAL_TABLET | Freq: Every evening | ORAL | Status: DC | PRN
  Administered 2023-11-08: 3 mg via ORAL
  Filled 2023-11-07: qty 1

## 2023-11-07 MED ORDER — LORAZEPAM 2 MG/ML IJ SOLN
0.5000 mg | Freq: Once | INTRAMUSCULAR | Status: AC
  Administered 2023-11-07: 0.5 mg via INTRAVENOUS
  Filled 2023-11-07: qty 1

## 2023-11-07 MED ORDER — ASPIRIN 81 MG PO CHEW
81.0000 mg | CHEWABLE_TABLET | Freq: Every day | ORAL | Status: DC
  Administered 2023-11-08 – 2023-11-09 (×2): 81 mg via ORAL
  Filled 2023-11-07 (×2): qty 1

## 2023-11-07 MED ORDER — ALBUTEROL SULFATE (2.5 MG/3ML) 0.083% IN NEBU: 2.5000 mg | INHALATION_SOLUTION | RESPIRATORY_TRACT | Status: DC | PRN

## 2023-11-07 MED ORDER — DOXYCYCLINE HYCLATE 100 MG PO TABS
100.0000 mg | ORAL_TABLET | Freq: Two times a day (BID) | ORAL | Status: DC
  Administered 2023-11-07 – 2023-11-09 (×3): 100 mg via ORAL
  Filled 2023-11-07 (×5): qty 1

## 2023-11-07 MED ORDER — SERTRALINE HCL 100 MG PO TABS
100.0000 mg | ORAL_TABLET | Freq: Every day | ORAL | Status: DC
  Administered 2023-11-09: 100 mg via ORAL
  Filled 2023-11-07: qty 1

## 2023-11-07 MED ORDER — ONDANSETRON HCL 4 MG/2ML IJ SOLN
4.0000 mg | Freq: Four times a day (QID) | INTRAMUSCULAR | Status: DC | PRN
  Administered 2023-11-08 (×2): 4 mg via INTRAVENOUS
  Filled 2023-11-07 (×2): qty 2

## 2023-11-07 MED ORDER — IOHEXOL 350 MG/ML SOLN
75.0000 mL | Freq: Once | INTRAVENOUS | Status: AC | PRN
  Administered 2023-11-07: 75 mL via INTRAVENOUS

## 2023-11-07 MED ORDER — CLOPIDOGREL BISULFATE 75 MG PO TABS
75.0000 mg | ORAL_TABLET | Freq: Every day | ORAL | Status: DC
  Administered 2023-11-09: 75 mg via ORAL
  Filled 2023-11-07 (×3): qty 1

## 2023-11-07 MED ORDER — UMECLIDINIUM BROMIDE 62.5 MCG/ACT IN AEPB
1.0000 | INHALATION_SPRAY | Freq: Every day | RESPIRATORY_TRACT | Status: DC
  Filled 2023-11-07: qty 7

## 2023-11-07 MED ORDER — STROKE: EARLY STAGES OF RECOVERY BOOK
Freq: Once | Status: DC
  Filled 2023-11-07: qty 1

## 2023-11-07 MED ORDER — CLOPIDOGREL BISULFATE 300 MG PO TABS
300.0000 mg | ORAL_TABLET | Freq: Once | ORAL | Status: DC
  Filled 2023-11-07: qty 1

## 2023-11-07 MED ORDER — INSULIN ASPART 100 UNIT/ML IJ SOLN
0.0000 [IU] | Freq: Three times a day (TID) | INTRAMUSCULAR | Status: DC
  Administered 2023-11-08 – 2023-11-09 (×2): 1 [IU] via SUBCUTANEOUS
  Filled 2023-11-07: qty 0.09

## 2023-11-07 MED ORDER — FAMOTIDINE 20 MG PO TABS
20.0000 mg | ORAL_TABLET | Freq: Every day | ORAL | Status: DC
  Administered 2023-11-08 – 2023-11-09 (×2): 20 mg via ORAL
  Filled 2023-11-07 (×2): qty 1

## 2023-11-07 MED ORDER — SIMVASTATIN 20 MG PO TABS: 40.0000 mg | ORAL_TABLET | Freq: Every day | ORAL | Status: DC

## 2023-11-07 MED ORDER — MECLIZINE HCL 25 MG PO TABS
25.0000 mg | ORAL_TABLET | Freq: Three times a day (TID) | ORAL | Status: DC | PRN
  Administered 2023-11-08 (×2): 25 mg via ORAL
  Filled 2023-11-07 (×3): qty 1

## 2023-11-07 MED ORDER — MOMETASONE FURO-FORMOTEROL FUM 100-5 MCG/ACT IN AERO
2.0000 | INHALATION_SPRAY | Freq: Two times a day (BID) | RESPIRATORY_TRACT | Status: DC
Start: 2023-11-08 — End: 2023-11-09
  Filled 2023-11-07: qty 8.8

## 2023-11-07 MED ORDER — ACETAMINOPHEN 650 MG RE SUPP: 650.0000 mg | Freq: Four times a day (QID) | RECTAL | Status: DC | PRN

## 2023-11-07 MED ORDER — ACETAMINOPHEN 325 MG PO TABS
650.0000 mg | ORAL_TABLET | Freq: Four times a day (QID) | ORAL | Status: DC | PRN
  Administered 2023-11-07: 650 mg via ORAL
  Filled 2023-11-07: qty 2

## 2023-11-07 MED ORDER — LEVOTHYROXINE SODIUM 100 MCG PO TABS
125.0000 ug | ORAL_TABLET | Freq: Every day | ORAL | Status: DC
  Administered 2023-11-08 – 2023-11-09 (×2): 125 ug via ORAL
  Filled 2023-11-07 (×2): qty 1

## 2023-11-07 NOTE — ED Notes (Addendum)
 Pt' s R middle toe has purulent drainage to it. Bandaid removed and wound cleaned. Pt reports she has toe issues like this sometimes.  Skin tear to L upper arm cleaned after bandaid removed. Tegaderm placed.

## 2023-11-07 NOTE — ED Provider Notes (Signed)
 [place] EMERGENCY DEPARTMENT AT [facility] Provider Note   CSN: [id number] Arrival date & time: 11/07/23  1502     History  Chief Complaint  Patient presents with   Nausea   Eye Problem    Stefanie Carter is a 78 y.o. female.  The history is provided by the patient and medical records. No language interpreter was used.  Eye Problem    78 year old female significant history of hypertension, anxiety, diabetes, spinal stenosis, brought here via EMS from home with complaint of dizziness.  Patient states for the past 2 days she has had persistent dizziness and for which she described as a room spinning sensation worse when she open her eyes and slightly improved with her eyes closed.  She feels extreme nausea and has had multiple vomiting as well as having some loose stools.  She reports she feels very dehydrated.  States she is mostly in bed due to extreme nausea.  She also complaining of generalized bodyaches, states she has pain throughout her whole entire spine and is requesting for IV hydration.  She denies any prior history of prior stroke.  Does have history of spinal stenosis and felt her prolonged time laying in bed for the past few days has worsening of her  back pain.  When asked about her eye problems she states she feels dizzy when she has her eyes open.  Home Medications Prior to Admission medications   Medication Sig Start Date End Date Taking? Authorizing Provider  Coenzyme Q10 (CO Q10) 100 MG CAPS Take by mouth.    [provider]  famotidine  (PEPCID ) 40 MG tablet  06/11/21   [provider]  levothyroxine  (SYNTHROID ) 125 MCG tablet  08/05/20   [provider]  MULTIPLE VITAMIN PO Take by mouth.    [provider]  PRAVASTATIN SODIUM PO Take 80 mg by mouth.     [provider]  predniSONE  (DELTASONE ) 50 MG tablet Take 1 tablet (50 mg total) by mouth daily. 08/16/23   Jerral Meth, MD  PREMARIN  0.3 MG  tablet  05/21/20   [provider]  pseudoephedrine (SUDAFED) 30 MG tablet 1 tablets as needed    [provider]  sertraline  (ZOLOFT ) 100 MG tablet  06/11/21   [provider]  sitaGLIPtin (JANUVIA) 25 MG tablet Take 25 mg by mouth daily.    [provider]  VITAMIN D, CHOLECALCIFEROL, PO Take by mouth.    [provider]      Allergies    Penicillins, Metformin hcl, Sulfa antibiotics, Sulfamethoxazole-trimethoprim, Varenicline, and Codeine    Review of Systems   Review of Systems  All other systems reviewed and are negative.   Physical Exam Updated Vital Signs BP (!) 166/96   Pulse 72   Temp 97.8 F (36.6 C) (Oral)   Resp 18   Ht 5' (1.524 m)   Wt 50.4 kg   SpO2 91%   BMI 21.70 kg/m  Physical Exam Vitals and nursing note reviewed.  Constitutional:      General: She is not in acute distress.    Appearance: She is well-developed.  HENT:     Head: Normocephalic and atraumatic.  Eyes:     Extraocular Movements: Extraocular movements intact.     Conjunctiva/sclera: Conjunctivae normal.     Pupils: Pupils are equal, round, and reactive to light.  Cardiovascular:     Rate and Rhythm: Normal rate and regular rhythm.     Pulses: Normal pulses.  Heart sounds: Normal heart sounds.  Pulmonary:     Effort: Pulmonary effort is normal.  Abdominal:     Palpations: Abdomen is soft.     Tenderness: There is no abdominal tenderness.  Musculoskeletal:     Cervical back: Normal range of motion and neck supple.     Comments: 5 out of 5 strength to all 4 extremities with intact distal pulses and normal sensation throughout.  Skin:    Findings: No rash.  Neurological:     Mental Status: She is alert and oriented to person, place, and time.     GCS: GCS eye subscore is 4. GCS verbal subscore is 5. GCS motor subscore is 6.     Cranial Nerves: Cranial nerves 2-12 are intact.     Sensory: Sensation is intact.     Motor: Motor function is  intact.     Coordination: Finger-Nose-Finger Test abnormal.     Gait: Gait abnormal.     Comments: Unable to ambulate.  Difficulty with finger-to-nose due to dizziness.  Psychiatric:        Mood and Affect: Mood normal.     ED Results / Procedures / Treatments   Labs (all labs ordered are listed, but only abnormal results are displayed) Labs Reviewed  BASIC METABOLIC PANEL - Abnormal; Notable for the following components:      Result Value   Potassium 3.4 (*)    Glucose, Bld 126 (*)    All other components within normal limits  CBC - Abnormal; Notable for the following components:   WBC 10.7 (*)    Hemoglobin 16.1 (*)    HCT 46.5 (*)    All other components within normal limits  URINALYSIS, ROUTINE W REFLEX MICROSCOPIC - Abnormal; Notable for the following components:   Ketones, ur 5 (*)    All other components within normal limits  CBG MONITORING, ED - Abnormal; Notable for the following components:   Glucose-Capillary 133 (*)    All other components within normal limits  RESP PANEL BY RT-PCR (RSV, FLU A&B, COVID)  [id number]    EKG None ED ECG REPORT   Date: 11/07/2023  Rate: 87  Rhythm: normal sinus rhythm  QRS Axis: right  Intervals: normal  ST/T Wave abnormalities: nonspecific ST changes  Conduction Disutrbances:none  Narrative Interpretation:   Old EKG Reviewed: unchanged  I have personally reviewed the EKG tracing and agree with the computerized printout as noted.   Radiology MR BRAIN WO CONTRAST Result Date: 11/07/2023 CLINICAL DATA:  Initial evaluation for acute dizziness, syncope please/presyncope. EXAM: MRI HEAD WITHOUT CONTRAST TECHNIQUE: Multiplanar, multiecho pulse sequences of the brain and surrounding structures were obtained without intravenous contrast. COMPARISON:  Prior CT from 09/29/2017 FINDINGS: Brain: Mild age-related cerebral atrophy. Patchy T2/FLAIR hyperintensity involving the periventricular deep white matter both cerebral hemispheres as well  as the pons, consistent with chronic small vessel ischemic disease, mild in nature. Patchy restricted diffusion involving the mid and lower left cerebellum, consistent with acute to early subacute ischemic infarcts. Largest area infarction measures 1.6 cm. Minimal patchy involvement of the left dorsal medulla (series 5, image 8). No associated hemorrhage or significant mass effect. No other areas of acute or subacute ischemia. Gray-white matter differentiation otherwise maintained. No acute or chronic intracranial blood products. No mass lesion, midline shift or mass effect. No hydrocephalus or extra-axial fluid collection. Pituitary gland suprasellar region within normal limits. Vascular: Loss of normal flow void within the left V4 segment, which could be related to slow  flow and/or occlusion (series 8, image 3). Major intracranial vascular flow voids are otherwise maintained. Skull and upper cervical spine: Severe degenerative osteoarthritic changes present about the C1-2 articulation with degenerative pannus formation (series 7, image 12). 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 Bone marrow signal intensity within normal limits. No scalp soft tissue abnormality. Sinuses/Orbits: Prior bilateral ocular lens replacement. Paranasal sinuses are clear. Small bilateral mastoid effusions noted, of doubtful significance. Negative nasopharynx. Other: None. IMPRESSION: 1. Patchy acute to early subacute ischemic infarcts involving the mid and lower left cerebellum, with minimal patchy involvement of the left dorsal medulla. No associated hemorrhage or significant mass effect. 2. Loss of normal flow void within the left V4 segment, which could be related to slow flow and/or occlusion. 3. Severe degenerative osteoarthritic changes about the C1-2 articulation with degenerative pannus formation. Resultant moderate to severe stenosis at the  cervicomedullary junction with focal kinking of the upper cervical spinal cord. 4. Underlying mild age-related cerebral atrophy with chronic small vessel ischemic disease. Electronically Signed   By: Morene Hoard M.D.   On: 11/07/2023 20:30   DG Chest Portable 1 View Result Date: 11/07/2023 CLINICAL DATA:  Cough EXAM: PORTABLE CHEST 1 VIEW COMPARISON:  08/16/2023 FINDINGS: The heart size and mediastinal contours are within normal limits. Aortic atherosclerosis. No focal airspace consolidation, pleural effusion, or pneumothorax. IMPRESSION: No active disease. Electronically Signed   By: Mabel Converse D.O.   On: 11/07/2023 16:34    Procedures .Critical Care  Performed by: Nivia Colon, PA-C Authorized by: Nivia Colon, PA-C   Critical care provider statement:    Critical care time (minutes):  40   Critical care was time spent personally by me on the following activities:  Development of treatment plan with patient or surrogate, discussions with consultants, evaluation of patient's response to treatment, examination of patient, ordering and review of laboratory studies, ordering and review of radiographic studies, ordering and performing treatments and interventions, pulse oximetry, re-evaluation of patient's condition and review of old charts     Medications Ordered in ED Medications  sodium chloride  0.9 % bolus 1,000 mL (0 mLs Intravenous Stopped 11/07/23 1931)  meclizine  (ANTIVERT ) tablet 25 mg (25 mg Oral Given 11/07/23 1636)  ondansetron  (ZOFRAN ) injection 4 mg (4 mg Intravenous Given 11/07/23 1636)  LORazepam  (ATIVAN ) injection 0.5 mg (0.5 mg Intravenous Given 11/07/23 1930)  LORazepam  (ATIVAN ) injection 0.5 mg (0.5 mg Intravenous Given 11/07/23 2103)  iohexol  (OMNIPAQUE ) 350 MG/ML injection 75 mL (75 mLs Intravenous Contrast Given 11/07/23 2114)    ED Course/ Medical Decision Making/ A&P                                 Medical Decision Making Amount and/or Complexity of Data  Reviewed Labs: ordered. Radiology: ordered.  Risk Prescription drug management.   BP (!) 166/96   Pulse 72   Temp 97.8 F (36.6 C) (Oral)   Resp 18   Ht 5' (1.524 m)   Wt 50.4 kg   SpO2 91%   BMI 21.70 kg/m   75:86 PM  78 year old female significant history of hypertension, anxiety, diabetes, spinal stenosis, brought here via EMS from home with complaint of dizziness.  Patient states for the past 2 days she has had persistent dizziness and for which she described as a room spinning sensation worse when she open her eyes and slightly improved  with her eyes closed.  She feels extreme nausea and has had multiple vomiting as well as having some loose stools.  She reports she feels very dehydrated.  States she is mostly in bed due to extreme nausea.  She also complaining of generalized bodyaches, states she has pain throughout her whole entire spine and is requesting for IV hydration.  She denies any prior history of prior stroke.  Does have history of spinal stenosis and felt her prolonged time laying in bed for the past few days has worsening of her  back pain.  When asked about her eye problems she states she feels dizzy when she has her eyes open.  On exam, patient without any focal neurodeficit.  She is mentating appropriately.  She has equal strength throughout all 4 extremities.  She does not have any obvious nystagmus however with eye movement and she endorsed worsening dizziness.  Positive Trenda Craze.   Dispo her symptoms likely viral in etiology, workup initiated.  Will give IV fluid and supportive care.  Will consider posterior circulation stroke if patient unable to ambulate after treatment.  6:49 PM Unfortunately despite receiving IV fluid and antiemetic along with meclizine , patient states she is unable to ambulate due to the dizziness.  Will obtain brain MRI to rule out posterior circulation stroke.  -Labs ordered, independently viewed and interpreted by me.  Labs remarkable  for overall reassuring. Negative covid/flu/rsv -The patient was maintained on a cardiac monitor.  I personally viewed and interpreted the cardiac monitored which showed an underlying rhythm of: NSR -Imaging independently viewed and interpreted by me and I agree with radiologist's interpretation.  Result remarkable for CXR unremarkable. Brain MRI pending -This patient presents to the ED for concern of dizzy, this involves an extensive number of treatment options, and is a complaint that carries with it a high risk of complications and morbidity.  The differential diagnosis includes dehydration, peripheral vertigo, central vertigo, viral illness, electrolytes imbalance, anemia -Co morbidities that complicate the patient evaluation includes spinal stenosis, anxiety HTN -Treatment includes meclizine , IVF, zofran  -Reevaluation of the patient after these medicines showed that the patient improved -PCP office notes or outside notes reviewed -Discussion with attending Dr. Dasie -Escalation to admission/observation considered: patient is agreeable with admission.  8:40 PM Brain MRI showing patchy acute to early subacute ischemic infarct involving the mid and lower left cerebellum with minimal patchy involvement of the left dorsal medulla.  This findings is consistent with patient's deficit since that she is unable to ambulate due to her dizziness.  Will consult neurology and hospitalist for hospital admission.  Patient is aware of plan.  8:51 PM Appreciate consultation from neurologist Dr. Jerrie who will request for head and neck CTA for further assessment, medicine admission and transfer to [facility] to be on the stroke floor.  9:30 PM Appreciate consultation from [facility] hospitalist, Dr. Marcene who agrees to admit patient and transfer patient over to [facility] to be admitted to the stroke floor.        Final Clinical Impression(s) / ED Diagnoses Final diagnoses:  Cerebellar stroke [facility])    Rx  / DC Orders ED Discharge Orders     None         Nivia Colon, PA-C 11/07/23 2131    Dasie Faden, MD 11/09/23 [phone]

## 2023-11-07 NOTE — ED Notes (Signed)
 Pt returned from imaging.

## 2023-11-07 NOTE — ED Notes (Signed)
 Pt reports dizziness has improved somewhat

## 2023-11-07 NOTE — Plan of Care (Addendum)
 2 days dizziness,  no other new neurological symptoms or deficits (chronic paraesthesias from neuropathy and C-spine stenosis), symptoms improving with supportive care in ED   CTA head and neck 1. Increasing attenuation of the distal left V3 segment with subsequent reocclusion at the intradural left V4 segment. Given the presence of underlying ischemic changes on prior brain MRI, this is suspected to reflect an acute dissection. 2. Mild atherosclerotic disease elsewhere about the major arterial vasculature of the head and neck as above. No other hemodynamically significant or correctable stenosis. 3.  Aortic Atherosclerosis (ICD10-I70.0).  MRI brain 1. Patchy acute to early subacute ischemic infarcts involving the mid and lower left cerebellum, with minimal patchy involvement of the left dorsal medulla. No associated hemorrhage or significant mass effect. 2. Loss of normal flow void within the left V4 segment, which could be related to slow flow and/or occlusion. 3. Severe degenerative osteoarthritic changes about the C1-2 articulation with degenerative pannus formation. 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 4. Underlying mild age-related cerebral atrophy with chronic small vessel ischemic disease.  # L cerebellar punctate small strokes, in the setting of likely left vert dissection vs atherosclerotic disease - Stroke labs ESR, HgbA1c, fasting lipid panel - Hold estrogens  as these increase stroke risk - Frequent neuro checks per protocol  - Echocardiogram - Aspirin  - dose 325mg  PO or 300mg  PR, followed by 81 mg daily - Plavix  300 mg load with 75 mg daily for at least 90 days - Adjust statin as needed for goal LDL < 70 - Risk factor modification - Telemetry monitoring;  - Blood pressure goal   - Out of permissive hypertension window, goal gradual normotension - PT consult, OT consult, Speech  consult, unless patient is back to baseline - Please notify neurology on arrival to [facility] for full consultation, discussed with Dr. Marcene Lola Jernigan MD-PhD [facility] [phone] Available 7 PM to 7 AM, outside of these hours please call Neurologist on call as listed on Amion.

## 2023-11-07 NOTE — ED Triage Notes (Signed)
 BIB EMS from home for nausea, vomiting, vision changes that started today. Pt states her left side is hurting, denies weakness. Pt states "I need an IV"

## 2023-11-07 NOTE — H&P (Signed)
 History and Physical      Stefanie Carter FMW:[id number] DOB: December 21, 1945 DOA: 11/07/2023; DOS: 11/07/2023  PCP: Ransom Other, MD  Patient coming from: home   I have personally briefly reviewed patient's old medical records in [facility]  Chief Complaint: dizziness  HPI: Stefanie Carter is a 78 y.o. female with medical history significant for essential hypertension, hyperlipidemia, type 2 diabetes mellitus complicated by diabetic peripheral polyneuropathy, GAD, who is admitted to [facility] on 11/07/2023 with acute ischemic cerebellar stroke after presenting from home to [facility] ED complaining of dizziness.   The patient reports 2 days of persistent dizziness, which she describes as the room is spinning.  She notes that this started 2 days ago, on 11/05/2023 and remains present at this time.  This been associated with nausea in the absence of vomiting.  She conveys a history of peripheral polyneuropathy involving the bilateral lower extremities in the context of her history of diabetes.  Denies any recent change in her chronic paresthesias involving the bilateral lower extremities and also denies any acute focal numbness or paresthesias.  She also denies any acute focal weakness, dysphagia, acute change in vision, word finding difficulties, slurred speech, facial droop, or acute headache.  No recent chest pain, shortness of breath, palpitations, diaphoresis, dizziness, presyncope, or syncope.  She conveys that her recent vertigo has not resulted in any fall over the last 2 days.   Denies any known previous history of stroke. Medical/social history notable for type 2 diabetes mellitus, with most recent hemoglobin A1c found to be 6% in January 2021, as well as a history of essential pretension, hyperlipidemia.  She also conveys that she is a former smoker, having completely quit smoking in 2013 after smoking 1 pack/day over the preceding 30 years.  She denies any known history of  paroxysmal atrial fibrillation or obstructive sleep apnea.  On no antiplatelet or anticoagulation medications at the time of the stroke, including no aspirin .  She is on pravastatin 80 mg p.o. daily, with reported good associated compliance.   She also conveys the onset erythema involving the third toe on her right foot for the last 3 to 4 days, associated with increased warmth, tenderness, mild swelling.  Denies any significant drainage from this location and denies rash to any other site.  No recent trauma to the right foot.  Not associate with any subjective fever, chills, rigors, or generalized myalgias.    ED Course:  Vital signs in the ED were notable for the following: Afebrile; rates in the 70s to 80s; systolic blood pressures in the 120s to 160s; respiratory rate 15-18, oxygen saturation 94 to 95% on room air.  Labs were notable for the following: BMP notable for sodium 140, potassium 3.4, bicarbonate 23, creatinine 0.62 and glucose 126.  CBC notable for white cell count 10,700.  Urinalysis showed no red blood cells and was leukocyte esterase/nitrate negative.  COVID, influenza, RSV PCR were all negative.  Per my interpretation, EKG in ED demonstrated the following: Sinus rhythm with PACs, heart rate 87, normal intervals, nonspecific T wave inversion in leads I, aVL, V1, V2, no evidence of ST changes, including no evidence of ST elevation.  Imaging in the ED, per corresponding formal radiology read, was notable for the following:  Chest x-ray, 1 view, showed no evidence of acute cardiopulmonary process.  MRI brain without contrast showed patchy acute to early subacute ischemic infarcts involving the mid and lower left cerebellum with minimal patchy involvement of the left  dorsal medulla without any evidence of associated hemorrhage or mass effect.  Additionally, MRI brain noted loss of normal flow void with in the left V4 segment, which, per radiology read, could be related to slow flow and  or occlusion.     EDP and I both discussed patient's case with on-call neurology, Dr. Jerrie, who recommended TRH admission to [facility] for further evaluation/management of acute ischemic stroke, including evaluation for modifiable ischemic CVA risk factors, and conveyed that neurology will consult at [facility]. Dr. Peggi initially recommendations also include loading with aspirin  and Plavix  this evening followed by 3 weeks course of daily baby aspirin  and Plavix  75 mg p.o. daily starting tomorrow. She also requested that we hold the patient's home estrogen due to associated increased risk risk.  While in the ED, the following were administered: Ativan  0.5 mg IV x 2 doses, Zofran  4 mg IV x 1, meclizine  25 mg p.o. x 1 dose, normal saline x 1 L bolus.  Subsequently, the patient was admitted to [facility] for further evaluation management of presenting acute ischemic cerebellar stroke in the setting of 2 days of persistent vertigo, with presenting labs notable for mild hypokalemia, and with presentation also associated with suspicion for cellulitis involving the right foot.     Review of Systems: As per HPI otherwise 10 point review of systems negative.   Past Medical History:  Diagnosis Date   Anxiety    Diabetes (HCC)    HLD (hyperlipidemia)    HTN (hypertension)    Hypothyroid    Spinal stenosis     Past Surgical History:  Procedure Laterality Date   APPENDECTOMY     SALIVARY GLAND SURGERY     TONSILLECTOMY      Social History:  reports that she quit smoking about 12 years ago. Her smoking use included cigarettes. She started smoking about 42 years ago. She has a 30 pack-year smoking history. She has never used smokeless tobacco. She reports current alcohol use. She reports that she does not use drugs.   Allergies  Allergen Reactions   Penicillins Anaphylaxis   Metformin Hcl Other (See Comments)   Sulfa Antibiotics     Had as a baby; doesn't know reaction.    Sulfamethoxazole-Trimethoprim Other (See Comments)    Other reaction(s): Unknown   Varenicline Other (See Comments)    Other reaction(s): intol   Codeine Nausea And Vomiting    Family History  Problem Relation Age of Onset   Heart disease Mother    COPD Mother    Kidney disease Father    Depression Father    Glaucoma Father     Family history reviewed and not pertinent    Prior to Admission medications   Medication Sig Start Date End Date Taking? Authorizing Provider  albuterol  (ACCUNEB ) 1.25 MG/3ML nebulizer solution Take 1 ampule by nebulization every 6 (six) hours as needed for wheezing or shortness of breath. 08/18/23   [provider]  Coenzyme Q10 (CO Q10) 100 MG CAPS Take by mouth.    [provider]  estradiol (ESTRACE) 0.5 MG tablet Take 0.5 mg by mouth daily.    [provider]  famotidine  (PEPCID ) 40 MG tablet  06/11/21   [provider]  levothyroxine  (SYNTHROID ) 125 MCG tablet  08/05/20   [provider]  LORazepam  (ATIVAN ) 0.5 MG tablet Take 0.5 mg by mouth every 8 (eight) hours as needed. 04/09/23   [provider]  MULTIPLE VITAMIN PO Take by mouth.  [provider]  PRAVASTATIN SODIUM PO Take 80 mg by mouth.     [provider]  predniSONE  (DELTASONE ) 50 MG tablet Take 1 tablet (50 mg total) by mouth daily. 08/16/23   Jerral Meth, MD  PREMARIN  0.3 MG tablet  05/21/20   [provider]  pseudoephedrine (SUDAFED) 30 MG tablet Take 30 mg by mouth every 6 (six) hours as needed for congestion.    [provider]  sertraline  (ZOLOFT ) 100 MG tablet  06/11/21   [provider]  sitaGLIPtin (JANUVIA) 25 MG tablet Take 25 mg by mouth daily.    [provider]  VITAMIN D, CHOLECALCIFEROL, PO Take by mouth.    [provider]     Objective    Physical Exam: Vitals:   11/07/23 1545 11/07/23 1915 11/07/23 1930 11/07/23 2144  BP: (!) 145/103 (!) 135/98  129/88 115/61  Pulse: 73 79  82  Resp: (!) 28 12 15 15   Temp:    97.7 F (36.5 C)  TempSrc:      SpO2: 94% 95%  91%  Weight:      Height:        General: appears to be stated age; alert, oriented Skin: warm, erythema associated with right third toe, associated with increased warmth, tenderness, in the absence of any associated crepitus Head:  AT/[place] Mouth:  Oral mucosa membranes appear moist, normal dentition Neck: supple; trachea midline Heart:  RRR; did not appreciate any M/R/G Lungs: CTAB, did not appreciate any wheezes, rales, or rhonchi Abdomen: + BS; soft, ND, NT Vascular: 2+ pedal pulses b/l; 2+ radial pulses b/l Extremities: no muscle wasting; erythema involving the right third toe associated with increased warmth, tenderness, swelling, as further detailed above Neuro: Symmetrical diminished sensation to light touch in the bilateral lower extremities; otherwise, sensation intact in upper and lower extremities bilaterally; strength intact in the bilateral upper and lower extremities; no evidence suggestive of slurred speech, dysarthria, or facial droop; Normal muscle tone. No tremors.    Labs on Admission: I have personally reviewed following labs and imaging studies  CBC: Recent Labs  Lab 11/07/23 1642  WBC 10.7*  HGB 16.1*  HCT 46.5*  MCV 92.8  PLT 311   Basic Metabolic Panel: Recent Labs  Lab 11/07/23 1642  NA 140  K 3.4*  CL 105  CO2 23  GLUCOSE 126*  BUN 17  CREATININE 0.62  CALCIUM  9.9   GFR: Estimated Creatinine Clearance: 43 mL/min (by C-G formula based on SCr of 0.62 mg/dL). Liver Function Tests: No results for input(s): AST, ALT, ALKPHOS, BILITOT, PROT, ALBUMIN in the last 168 hours. No results for input(s): LIPASE, AMYLASE in the last 168 hours. No results for input(s): AMMONIA in the last 168 hours. Coagulation Profile: No results for input(s): INR, PROTIME in the last 168 hours. Cardiac Enzymes: No results for  input(s): CKTOTAL, CKMB, CKMBINDEX, TROPONINI in the last 168 hours. BNP (last 3 results) No results for input(s): PROBNP in the last 8760 hours. HbA1C: No results for input(s): HGBA1C in the last 72 hours. CBG: Recent Labs  Lab 11/07/23 1632  GLUCAP 133*   Lipid Profile: No results for input(s): CHOL, HDL, LDLCALC, TRIG, CHOLHDL, LDLDIRECT in the last 72 hours. Thyroid  Function Tests: No results for input(s): TSH, T4TOTAL, FREET4, T3FREE, THYROIDAB in the last 72 hours. Anemia Panel: No results for input(s): VITAMINB12, FOLATE, FERRITIN, TIBC, IRON, RETICCTPCT in the last 72 hours. Urine analysis:    Component Value Date/Time   COLORURINE  YELLOW 11/07/2023 1906   APPEARANCEUR CLEAR 11/07/2023 1906   LABSPEC 1.013 11/07/2023 1906   PHURINE 6.0 11/07/2023 1906   GLUCOSEU NEGATIVE 11/07/2023 1906   HGBUR NEGATIVE 11/07/2023 1906   BILIRUBINUR NEGATIVE 11/07/2023 1906   BILIRUBINUR negative 12/07/2020 1632   KETONESUR 5 (A) 11/07/2023 1906   PROTEINUR NEGATIVE 11/07/2023 1906   UROBILINOGEN 0.2 12/07/2020 1632   NITRITE NEGATIVE 11/07/2023 1906   LEUKOCYTESUR NEGATIVE 11/07/2023 1906    Radiological Exams on Admission: MR BRAIN WO CONTRAST Result Date: 11/07/2023 CLINICAL DATA:  Initial evaluation for acute dizziness, syncope please/presyncope. EXAM: MRI HEAD WITHOUT CONTRAST TECHNIQUE: Multiplanar, multiecho pulse sequences of the brain and surrounding structures were obtained without intravenous contrast. COMPARISON:  Prior CT from 09/29/2017 FINDINGS: Brain: Mild age-related cerebral atrophy. Patchy T2/FLAIR hyperintensity involving the periventricular deep white matter both cerebral hemispheres as well as the pons, consistent with chronic small vessel ischemic disease, mild in nature. Patchy restricted diffusion involving the mid and lower left cerebellum, consistent with acute to early subacute ischemic infarcts. Largest area  infarction measures 1.6 cm. Minimal patchy involvement of the left dorsal medulla (series 5, image 8). No associated hemorrhage or significant mass effect. No other areas of acute or subacute ischemia. Gray-white matter differentiation otherwise maintained. No acute or chronic intracranial blood products. No mass lesion, midline shift or mass effect. No hydrocephalus or extra-axial fluid collection. Pituitary gland suprasellar region within normal limits. Vascular: Loss of normal flow void within the left V4 segment, which could be related to slow flow and/or occlusion (series 8, image 3). Major intracranial vascular flow voids are otherwise maintained. Skull and upper cervical spine: Severe degenerative osteoarthritic changes present about the C1-2 articulation with degenerative pannus formation (series 7, image 12). 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 Bone marrow signal intensity within normal limits. No scalp soft tissue abnormality. Sinuses/Orbits: Prior bilateral ocular lens replacement. Paranasal sinuses are clear. Small bilateral mastoid effusions noted, of doubtful significance. Negative nasopharynx. Other: None. IMPRESSION: 1. Patchy acute to early subacute ischemic infarcts involving the mid and lower left cerebellum, with minimal patchy involvement of the left dorsal medulla. No associated hemorrhage or significant mass effect. 2. Loss of normal flow void within the left V4 segment, which could be related to slow flow and/or occlusion. 3. Severe degenerative osteoarthritic changes about the C1-2 articulation with degenerative pannus formation. 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 4. Underlying mild age-related cerebral atrophy with chronic small vessel ischemic disease. Electronically Signed   By: Morene Hoard M.D.   On:  11/07/2023 20:30   DG Chest Portable 1 View Result Date: 11/07/2023 CLINICAL DATA:  Cough EXAM: PORTABLE CHEST 1 VIEW COMPARISON:  08/16/2023 FINDINGS: The heart size and mediastinal contours are within normal limits. Aortic atherosclerosis. No focal airspace consolidation, pleural effusion, or pneumothorax. IMPRESSION: No active disease. Electronically Signed   By: Mabel Converse D.O.   On: 11/07/2023 16:34      Assessment/Plan   Principal Problem:   Acute ischemic stroke (HCC) Active Problems:   HLD (hyperlipidemia)   Essential hypertension   Vertigo   Hypokalemia   Cellulitis of right lower extremity   DM2 (diabetes mellitus, type 2) (HCC)   GAD (generalized anxiety disorder)   Acquired hypothyroidism   History of COPD    #) Acute ischemic cerebellar CVA: dx on the basis of 2 days of  persistent vertigo, with MRI brain showing evidence of acute to early subacute ischemic infarcts involving the mid and lower left cerebellum.  In the setting, last known well is 11/05/23.  Consequently, she is outside of window for consideration for tPA administration, is also outside of window for thrombectomy and for permissive hypertension.  CTA head and neck is currently pending.  EDP and I both discussed patient's case with on-call neurology, Dr. Jerrie, who recommended TRH admission to [facility] for further evaluation/management of acute ischemic stroke, including evaluation for modifiable ischemic CVA risk factors, and conveyed that neurology will consult at [facility]. Dr. Peggi initially recommendations also include loading with aspirin  and Plavix  this evening followed by 3 weeks course of daily baby aspirin  and Plavix  75 mg p.o. daily starting tomorrow. She also requested that we hold the patient's home estrogen due to associated increased risk risk.   The patient possesses multiple modifiable CVA risk factors including a history of  DM2, essential hypertension, hyperlipidemia.  On pravastatin 80 mg  p.o. daily as an outpatient.  She is also former smoker..  Other potential ischemic CVA risk factors include her outpatient use of systemic estrogen therapy.     Plan: Nursing bedside swallow evaluation x 1 now, and will not initiate oral medications or diet until the patient has passed this. Head of the bed at 30 degrees. Neuro checks per protocol. VS per protocol. Monitor on telemetry, including monitoring for atrial fibrillation as modifiable risk factor for acute ischemic CVA.  Follow-up for result of CTA head and neck, as above.  TTE without  bubble study has been ordered for the morning. Additionally, as component of evaluation of potential modifiable ischemic CVA risk factors, will also check lipid panel and A1c. PT/OT/ST consults have been ordered for the morning.  Neurology to formally consult.  Full dose aspirin  x 1 now.  Plavix  300 mg p.o. x 1 dose now.  3-week course of dual antiplatelet therapy with daily baby aspirin  and Plavix  75 mg p.o. daily starting on 11/08/2023.  Hold home estradiol.  As needed meclizine .  Fall precautions ordered.  As needed IV Zofran . Will check ESR in setting of history of autoimmune disease, per recommendation of neurology.                #) Hypokalemia: presenting potassium level noted to be 3.4.    Plan: monitor on tele. KCl  40 meq p.o. x 1 dose. Add-on serum mag level. CMP, mag level in the AM.                    #) Cellulitis of right foot: Suspected the setting of patient's report of 3 to 4 days of new right third toe erythema, tenderness, increased warmth, swelling.  Risk factors include her history of diabetic peripheral polyneuropathy.  Relative to this baseline sensory deficit in the setting of her peripheral polyneuropathy, right lower extremity is otherwise neurovascularly intact. Will check plain film of the right foot as well as assessment of general inflammatory markers to further assess for any evidence of underlying  osteomyelitis, as below.  In terms of antibiotic selection, initial consideration was given to initiation of course of cephalosporins.  However, she has a documented history of anaphylactic reaction to penicillin, and , per my initial chart review, have not found documentation that she has previously tolerated cephalosporins.  Consequently, we will pursue doxycycline  for now.  Of note, her blood cell count is mildly elevated at 10,700, which does not  meet quantitative threshold of greater than 12,000 for inclusion in SIRS criteria.  Consequently, in the absence of a white blood cell count greater than 12,000 and in the absence of objective fever, SIRS criteria are not met for sepsis.  Presentation is not associate with any evidence of hypotension.  Plan: Doxycycline , as above.  Check ESR, CRP.  Plain film of the right foot, as above.  Repeat CBC in the morning.  Prn acetaminophen  for pain/fever.                   #) Essential Hypertension: documented h/o such, which she conveys is managed via lifestyle modifications at home, she is not currently on any dedicated hypertensive medications as an outpatient.   SBP's in the ED today: 120s to 160s mmHg. close monitoring of ensuing blood pressure, as below, particular given plan for evaluation for modifiable ischemic CVA risk factors, as above.  As noted above, she is outside of the window for observance of permissive hypertension in the setting of presenting acute ischemic CVA.  Plan: Close monitoring of subsequent BP via routine VS.                     #) Type 2 Diabetes Mellitus: documented history of such. Home insulin  regimen: None.  On Januvia at home.  .  presenting blood sugar: 126. Most recent A1c noted to be 6.0% in January 2021.  Complicated by diabetic peripheral polyneuropathy, for which she was recently started on gabapentin .  Plan: accuchecks QAC and HS with low dose SSI.  Add on hemoglobin A1c level.  Hold  home Januvia for now.  Resume outpatient recently initiated gabapentin .                  #) Hyperlipidemia: documented h/o such. On pravastatin as outpatient.   Plan: continue home statin.  Follow-up for results of lipid panel.                   #) Generalized anxiety disorder: documented h/o such. On scheduled Zoloft  as well as as needed Ativan  as outpatient.    Plan: Continue outpatient Zoloft  and as needed Ativan .                  #) acquired hypothyroidism: documented h/o such, on Synthroid  as outpatient.   Plan: cont home Synthroid .                     #) COPD: Documented history thereof, without clinical evidence of acute exacerbation at this time.   Outpatient respiratory regimen includes the following: Breztri inhaler as well as prn albuterol  inhaler.  She confirms that she is a former smoker, as above.  Plan: cont outpatient Breztri inhaler. Prn albuterol  nebulizer. Check CMP and serum magnesium  level in the AM.      DVT prophylaxis: SCD's   Code Status: Full code Family Communication: none Disposition Plan: Per Rounding Team Consults called: I discussed patient's case with on-call neurology, Dr. Jerrie, as further detailed above;  Admission status: Inpatient     I SPENT GREATER THAN 75  MINUTES IN CLINICAL CARE TIME/MEDICAL DECISION-MAKING IN COMPLETING THIS ADMISSION.      Eva NOVAK Devone Bonilla DO [facility]  From 7PM - 7AM   11/07/2023, 9:54 PM

## 2023-11-07 NOTE — ED Notes (Signed)
 Patient transported to MRI

## 2023-11-07 NOTE — ED Provider Notes (Signed)
 I provided a substantive portion of the care of this patient.  I personally made/approved the management plan for this patient and take responsibility for the patient management.  EKG Interpretation Date/Time:  Saturday November 07 2023 16:36:36 EST Ventricular Rate:  87 PR Interval:  181 QRS Duration:  75 QT Interval:  412 QTC Calculation: 496 R Axis:   116  Text Interpretation: Sinus or ectopic atrial rhythm Atrial premature complexes Probable lateral infarct, age indeterminate Probable anteroseptal infarct, recent No significant change since last tracing Confirmed by Dasie Faden ([id number]) on 11/07/2023 8:48:56 PM   Patient is EKG per interpretation shows sinus rhythm.  Patient here complaining of dizziness and ataxia.  Head CT without acute findings.  MRI did show acute stroke.  Will consult neurology and admit to the medicine service   Dasie Faden, MD 11/07/23 2049

## 2023-11-07 NOTE — ED Notes (Addendum)
 MRI came to get pt and she states she needs CT scan and is refusing MRI. EDP at bedside to discuss with her further.

## 2023-11-08 ENCOUNTER — Inpatient Hospital Stay (HOSPITAL_COMMUNITY): Payer: Medicare HMO

## 2023-11-08 DIAGNOSIS — Z8709 Personal history of other diseases of the respiratory system: Secondary | ICD-10-CM

## 2023-11-08 DIAGNOSIS — F411 Generalized anxiety disorder: Secondary | ICD-10-CM | POA: Diagnosis present

## 2023-11-08 DIAGNOSIS — L97509 Non-pressure chronic ulcer of other part of unspecified foot with unspecified severity: Secondary | ICD-10-CM | POA: Diagnosis not present

## 2023-11-08 DIAGNOSIS — E876 Hypokalemia: Secondary | ICD-10-CM | POA: Diagnosis present

## 2023-11-08 DIAGNOSIS — R42 Dizziness and giddiness: Secondary | ICD-10-CM

## 2023-11-08 DIAGNOSIS — I639 Cerebral infarction, unspecified: Secondary | ICD-10-CM | POA: Diagnosis not present

## 2023-11-08 DIAGNOSIS — E039 Hypothyroidism, unspecified: Secondary | ICD-10-CM | POA: Diagnosis present

## 2023-11-08 DIAGNOSIS — E119 Type 2 diabetes mellitus without complications: Secondary | ICD-10-CM

## 2023-11-08 DIAGNOSIS — L03115 Cellulitis of right lower limb: Secondary | ICD-10-CM | POA: Diagnosis present

## 2023-11-08 LAB — LIPID PANEL
Cholesterol: 186 mg/dL (ref 0–200)
HDL: 81 mg/dL (ref >40)
LDL Cholesterol: 88 mg/dL (ref 0–99)
Total CHOL/HDL Ratio: 2.3 {ratio}
Triglycerides: 83 mg/dL (ref <150)
VLDL: 17 mg/dL (ref 0–40)

## 2023-11-08 LAB — CBG MONITORING, ED
Glucose-Capillary: 134 mg/dL — ABNORMAL HIGH (ref 70–99)
Glucose-Capillary: 149 mg/dL — ABNORMAL HIGH (ref 70–99)

## 2023-11-08 LAB — CBC WITH DIFFERENTIAL/PLATELET
Abs Immature Granulocytes: 0.04 10*3/uL (ref 0.00–0.07)
Basophils Absolute: 0.1 10*3/uL (ref 0.0–0.1)
Basophils Relative: 1 %
Eosinophils Absolute: 0.2 10*3/uL (ref 0.0–0.5)
Eosinophils Relative: 2 %
HCT: 42.8 % (ref 36.0–46.0)
Hemoglobin: 14.2 g/dL (ref 12.0–15.0)
Immature Granulocytes: 1 %
Lymphocytes Relative: 24 %
Lymphs Abs: 2.1 10*3/uL (ref 0.7–4.0)
MCH: 31.8 pg (ref 26.0–34.0)
MCHC: 33.2 g/dL (ref 30.0–36.0)
MCV: 96 fL (ref 80.0–100.0)
Monocytes Absolute: 0.7 10*3/uL (ref 0.1–1.0)
Monocytes Relative: 8 %
Neutro Abs: 5.7 10*3/uL (ref 1.7–7.7)
Neutrophils Relative %: 64 %
Platelets: 291 10*3/uL (ref 150–400)
RBC: 4.46 MIL/uL (ref 3.87–5.11)
RDW: 12.6 % (ref 11.5–15.5)
WBC: 8.8 10*3/uL (ref 4.0–10.5)
nRBC: 0 % (ref 0.0–0.2)

## 2023-11-08 LAB — COMPREHENSIVE METABOLIC PANEL
ALT: 36 U/L (ref 0–44)
AST: 32 U/L (ref 15–41)
Albumin: 3.2 g/dL — ABNORMAL LOW (ref 3.5–5.0)
Alkaline Phosphatase: 66 U/L (ref 38–126)
Anion gap: 8 (ref 5–15)
BUN: 18 mg/dL (ref 8–23)
CO2: 25 mmol/L (ref 22–32)
Calcium: 8.8 mg/dL — ABNORMAL LOW (ref 8.9–10.3)
Chloride: 107 mmol/L (ref 98–111)
Creatinine, Ser: 0.65 mg/dL (ref 0.44–1.00)
GFR, Estimated: >60 mL/min (ref >60)
Glucose, Bld: 118 mg/dL — ABNORMAL HIGH (ref 70–99)
Potassium: 3.4 mmol/L — ABNORMAL LOW (ref 3.5–5.1)
Sodium: 140 mmol/L (ref 135–145)
Total Bilirubin: 0.4 mg/dL (ref 0.0–1.2)
Total Protein: 6.1 g/dL — ABNORMAL LOW (ref 6.5–8.1)

## 2023-11-08 LAB — C-REACTIVE PROTEIN: CRP: <0.5 mg/dL (ref <1.0)

## 2023-11-08 LAB — HEMOGLOBIN A1C
Hgb A1c MFr Bld: 6.5 % — ABNORMAL HIGH (ref 4.8–5.6)
Mean Plasma Glucose: 139.85 mg/dL

## 2023-11-08 LAB — MAGNESIUM: Magnesium: 2.1 mg/dL (ref 1.7–2.4)

## 2023-11-08 LAB — SEDIMENTATION RATE: Sed Rate: 5 mm/h (ref 0–22)

## 2023-11-08 LAB — GLUCOSE, CAPILLARY: Glucose-Capillary: 140 mg/dL — ABNORMAL HIGH (ref 70–99)

## 2023-11-08 MED ORDER — ATORVASTATIN CALCIUM 80 MG PO TABS
80.0000 mg | ORAL_TABLET | Freq: Every day | ORAL | Status: DC
  Filled 2023-11-08: qty 1

## 2023-11-08 MED ORDER — CLOPIDOGREL BISULFATE 300 MG PO TABS
300.0000 mg | ORAL_TABLET | Freq: Once | ORAL | Status: AC
  Administered 2023-11-08: 300 mg via ORAL
  Filled 2023-11-08: qty 1

## 2023-11-08 MED ORDER — POTASSIUM CHLORIDE CRYS ER 20 MEQ PO TBCR
20.0000 meq | EXTENDED_RELEASE_TABLET | Freq: Once | ORAL | Status: AC
  Administered 2023-11-08: 20 meq via ORAL
  Filled 2023-11-08: qty 1

## 2023-11-08 MED ORDER — ORAL CARE MOUTH RINSE: 15.0000 mL | OROMUCOSAL | Status: DC | PRN

## 2023-11-08 NOTE — Hospital Course (Addendum)
 51 F w/ h/o HTN, HLD, DM2, presented to [facility] ED c/o 2 days of vertigo. In the ED afebrile vitals stable, labs with hypokalemia, hemoglobin at 16.1 MRI brain showed patchy acute to early subacute ischemic infarcts of left cerebellum, left dorsal medulla, severe degenerative osteoarthritic changes about C1-2 resulting moderate to severe stenosis. Neurology was consulted and admitted to [facility] for further management

## 2023-11-08 NOTE — Progress Notes (Signed)
 Ankle-brachial index completed. Please see CV Procedures for preliminary results.  Shona Simpson, RVT 11/08/23 2:41 PM

## 2023-11-08 NOTE — ED Notes (Signed)
 Informed patient that she has 1000 morning medications due, I went over all the medications ordered for patient. Patient refused to take any of her medications. She also reports that she does not take plavix , never has and will not. Informed patient that I would make a note of it. Informed MD of patient refusing all her medications.

## 2023-11-08 NOTE — Progress Notes (Signed)
 PT Note  Patient Details Name: [name] MRN: [id number] DOB: Dec 29, 1945      Pt in ED at this time with notes mentioning she is being transferred to [facility] for further stroke work up. Will hold PT eval at this time until transferred.  If you need us  to see patient prior to then please don't hesitate to reach out to the [facility] team on secure chat. Thank you    Erik Nessel, [facility] 11/08/2023, 11:02 AM

## 2023-11-08 NOTE — Consult Note (Signed)
 Stroke Team Consultation  Reason for Consult: Stroke on MRI  CC: dizziness  History is obtained from:Patient, chart review  HPI: Stefanie Carter is a 78 y.o. female hypertension, anxiety, diabetes, spinal stenosis, brought to the ED via EMS from home with complaint of dizziness.  She reports 2 days of persistent dizziness that improved with her eyes closed or with 1 eye closed.  Could not walk straight. Today she is feeling significantly better than she did a couple of days ago, but she is having intermittent spells of dizziness.  She she was additionally having extreme nausea with episodes of vomiting however today this is also improved.  She is ordering dinner.  Neurology is seeing her for a acute to early subacute infarct in the left cerebellum and left dorsal medulla.  Of note she does have severe spinal stenosis that she does follow with orthopedics for.  She recently had an intralaminar epidural injection at the right C7-T1.     LKW: 1/9 TNK given?: no, outside of window Premorbid modified Rankin scale (mRS):  0-Completely asymptomatic and back to baseline post-stroke  ROS: Full ROS was performed and is negative except as noted in the HPI.   Past Medical History:  Diagnosis Date   Anxiety    Diabetes (HCC)    HLD (hyperlipidemia)    HTN (hypertension)    Hypothyroid    Spinal stenosis     Family History  Problem Relation Age of Onset   Heart disease Mother    COPD Mother    Kidney disease Father    Depression Father    Glaucoma Father     Social History:   reports that she quit smoking about 12 years ago. Her smoking use included cigarettes. She started smoking about 42 years ago. She has a 30 pack-year smoking history. She has never used smokeless tobacco. She reports current alcohol use. She reports that she does not use drugs.  Medications  Current Facility-Administered Medications:     stroke: early stages of recovery book, , Does not apply, Once, Howerter,  Justin B, DO   acetaminophen  (TYLENOL ) tablet 650 mg, 650 mg, Oral, Q6H PRN, 650 mg at 11/07/23 2326 **OR** acetaminophen  (TYLENOL ) suppository 650 mg, 650 mg, Rectal, Q6H PRN, Howerter, Justin B, DO   albuterol  (PROVENTIL ) (2.5 MG/3ML) 0.083% nebulizer solution 2.5 mg, 2.5 mg, Nebulization, Q4H PRN, Howerter, Justin B, DO   aspirin  chewable tablet 81 mg, 81 mg, Oral, Daily, Howerter, Justin B, DO   clopidogrel  (PLAVIX ) tablet 300 mg, 300 mg, Oral, Once, Howerter, Justin B, DO   clopidogrel  (PLAVIX ) tablet 75 mg, 75 mg, Oral, Daily, Howerter, Justin B, DO   doxycycline  (VIBRA -TABS) tablet 100 mg, 100 mg, Oral, Q12H, Howerter, Justin B, DO, 100 mg at 11/07/23 2318   famotidine  (PEPCID ) tablet 20 mg, 20 mg, Oral, Daily, Howerter, Justin B, DO   gabapentin  (NEURONTIN ) capsule 100 mg, 100 mg, Oral, TID, Howerter, Justin B, DO, 100 mg at 11/07/23 2318   insulin  aspart (novoLOG ) injection 0-9 Units, 0-9 Units, Subcutaneous, TID WC, Howerter, Justin B, DO   levothyroxine  (SYNTHROID ) tablet 125 mcg, 125 mcg, Oral, Q0600, Howerter, Justin B, DO, 125 mcg at 11/08/23 9357   LORazepam  (ATIVAN ) tablet 0.5 mg, 0.5 mg, Oral, Q8H PRN, Howerter, Justin B, DO, 0.5 mg at 11/07/23 2327   meclizine  (ANTIVERT ) tablet 25 mg, 25 mg, Oral, TID PRN, Howerter, Justin B, DO   melatonin tablet 3 mg, 3 mg, Oral, QHS PRN, Howerter, Justin B, DO  mometasone -formoterol  (DULERA ) 100-5 MCG/ACT inhaler 2 puff, 2 puff, Inhalation, BID, Howerter, Justin B, DO   ondansetron  (ZOFRAN ) injection 4 mg, 4 mg, Intravenous, Q6H PRN, Howerter, Justin B, DO   potassium chloride  (KLOR-CON ) packet 40 mEq, 40 mEq, Oral, Once, Howerter, Justin B, DO   sertraline  (ZOLOFT ) tablet 100 mg, 100 mg, Oral, Daily, Howerter, Justin B, DO   simvastatin  (ZOCOR ) tablet 40 mg, 40 mg, Oral, q1800, Howerter, Justin B, DO   umeclidinium bromide  (INCRUSE ELLIPTA ) 62.5 MCG/ACT 1 puff, 1 puff, Inhalation, Daily, Howerter, Justin B, DO  Current Outpatient  Medications:    acetaminophen  (TYLENOL ) 325 MG tablet, Take 325 mg by mouth every 6 (six) hours as needed for moderate pain (pain score 4-6)., Disp: , Rfl:    albuterol  (ACCUNEB ) 1.25 MG/3ML nebulizer solution, Take 1 ampule by nebulization every 6 (six) hours as needed for wheezing or shortness of breath., Disp: , Rfl:    Budeson-Glycopyrrol-Formoterol  (BREZTRI AEROSPHERE) 160-9-4.8 MCG/ACT AERO, Inhale 2 puffs into the lungs in the morning and at bedtime., Disp: , Rfl:    caffeine 200 MG TABS tablet, Take 200 mg by mouth daily as needed., Disp: , Rfl:    Coenzyme Q10 (CO Q10) 100 MG CAPS, Take by mouth., Disp: , Rfl:    famotidine  (PEPCID ) 40 MG tablet, , Disp: , Rfl:    ibuprofen (ADVIL) 200 MG tablet, Take 200 mg by mouth every 6 (six) hours as needed for mild pain (pain score 1-3) or moderate pain (pain score 4-6)., Disp: , Rfl:    ipratropium-albuterol  (DUONEB) 0.5-2.5 (3) MG/3ML SOLN, Take 3 mLs by nebulization every 6 (six) hours as needed., Disp: , Rfl:    levothyroxine  (SYNTHROID ) 125 MCG tablet, , Disp: , Rfl:    loratadine (CLARITIN) 10 MG tablet, Take 10 mg by mouth daily., Disp: , Rfl:    LORazepam  (ATIVAN ) 0.5 MG tablet, Take 0.5 mg by mouth every 8 (eight) hours as needed., Disp: , Rfl:    Magnesium  200 MG TABS, Take 100 mg by mouth daily., Disp: , Rfl:    MULTIPLE VITAMIN PO, Take by mouth., Disp: , Rfl:    Omega-3 Fatty Acids (FISH OIL PO), Take 1 capsule by mouth daily., Disp: , Rfl:    PRAVASTATIN SODIUM PO, Take 80 mg by mouth. , Disp: , Rfl:    pseudoephedrine (SUDAFED) 30 MG tablet, Take 30 mg by mouth every 6 (six) hours as needed for congestion., Disp: , Rfl:    senna (SENOKOT) 8.6 MG tablet, Take 100 mg by mouth daily., Disp: , Rfl:    sertraline  (ZOLOFT ) 100 MG tablet, Take 100 mg by mouth daily., Disp: , Rfl:    VITAMIN D, CHOLECALCIFEROL, PO, Take by mouth., Disp: , Rfl:    vitamin E 200 UNIT capsule, Take 200 Units by mouth daily., Disp: , Rfl:    estradiol  (ESTRACE) 0.5 MG tablet, Take 0.5 mg by mouth daily. (Patient not taking: Reported on 11/07/2023), Disp: , Rfl:    predniSONE  (DELTASONE ) 50 MG tablet, Take 1 tablet (50 mg total) by mouth daily. (Patient not taking: Reported on 11/07/2023), Disp: 5 tablet, Rfl: 0   PREMARIN  0.3 MG tablet, , Disp: , Rfl:    sitaGLIPtin (JANUVIA) 25 MG tablet, Take 25 mg by mouth daily. (Patient not taking: Reported on 11/07/2023), Disp: , Rfl:   Exam: Current vital signs: BP 130/83 (BP Location: Right Arm)   Pulse 67   Temp 97.7 F (36.5 C) (Oral)   Resp 18  Ht 5' (1.524 m)   Wt 50.4 kg   SpO2 93%   BMI 21.70 kg/m  Vital signs in last 24 hours: Temp:  [97.7 F (36.5 C)-98.2 F (36.8 C)] 97.7 F (36.5 C) (01/12 0756) Pulse Rate:  [67-82] 67 (01/12 0756) Resp:  [12-28] 18 (01/12 0756) BP: (110-166)/(61-103) 130/83 (01/12 0756) SpO2:  [91 %-96 %] 93 % (01/12 0756) Weight:  [50.4 kg] 50.4 kg (01/11 1515)  GENERAL: Awake, alert in NAD HEENT: - Normocephalic and atraumatic, moist mm, no LN++, no Thyromegally LUNGS - Clear to auscultation bilaterally with no wheezes CV - S1S2 RRR, no m/r/g, equal pulses bilaterally. ABDOMEN - Soft, nontender, nondistended with normoactive BS Ext: warm, well perfused, intact peripheral pulses, no edema  NEURO:  Mental Status: AA&Ox3  Language: speech is clear.  Naming, repetition, fluency, and comprehension intact. Cranial Nerves: PERRL mm/brisk. EOMI, visual fields full, no facial asymmetry, facial sensation intact, hearing intact, tongue/uvula/soft palate midline, normal sternocleidomastoid and trapezius muscle strength. No evidence of tongue atrophy or fibrillations Motor: Elevates all extremities antigravity.  Endorses back pain with elevation of bilateral lower extremities limiting confrontational strength. Tone: is normal and bulk is normal Sensation- Intact to light touch bilaterally Coordination: Left FNF and HKS impared  Gait- deferred  1a Level of  Conscious.: 0 1b LOC Questions: 0 1c LOC Commands: 0 2 Best Gaze: 0 3 Visual: 0 4 Facial Palsy: 0 5a Motor Arm - left: 0 5b Motor Arm - Right: 0 6a Motor Leg - Left: 0 6b Motor Leg - Right: 0 7 Limb Ataxia: 2 8 Sensory: 0 9 Best Language: 0 10 Dysarthria: 0  11 Extinct. and Inatten.: 0 TOTAL: 2     Labs I have reviewed labs in epic and the results pertinent to this consultation are:  CBC    Component Value Date/Time   WBC 8.8 11/08/2023 0648   RBC 4.46 11/08/2023 0648   HGB 14.2 11/08/2023 0648   HCT 42.8 11/08/2023 0648   PLT 291 11/08/2023 0648   MCV 96.0 11/08/2023 0648   MCH 31.8 11/08/2023 0648   MCHC 33.2 11/08/2023 0648   RDW 12.6 11/08/2023 0648   LYMPHSABS 2.1 11/08/2023 0648   MONOABS 0.7 11/08/2023 0648   EOSABS 0.2 11/08/2023 0648   BASOSABS 0.1 11/08/2023 0648    CMP     Component Value Date/Time   NA 140 11/08/2023 0648   K 3.4 (L) 11/08/2023 0648   CL 107 11/08/2023 0648   CO2 25 11/08/2023 0648   GLUCOSE 118 (H) 11/08/2023 0648   BUN 18 11/08/2023 0648   CREATININE 0.65 11/08/2023 0648   CALCIUM  8.8 (L) 11/08/2023 0648   PROT 6.1 (L) 11/08/2023 0648   ALBUMIN 3.2 (L) 11/08/2023 0648   AST 32 11/08/2023 0648   ALT 36 11/08/2023 0648   ALKPHOS 66 11/08/2023 0648   BILITOT 0.4 11/08/2023 0648   GFRNONAA >60 11/08/2023 0648    Lipid Panel     Component Value Date/Time   CHOL 186 11/08/2023 0648   TRIG 83 11/08/2023 0648   HDL 81 11/08/2023 0648   CHOLHDL 2.3 11/08/2023 0648   VLDL 17 11/08/2023 0648   LDLCALC 88 11/08/2023 0648     Stroke:  Left mid and lower cerebellar infarcts. Ataxic on exam Etiology:  occlusion at V3 with reocculusion at the intradural left V4 CTA head & neck  Increasing attenuation of the distal left V3 segment with subsequent reocclusion at the intradural left V4 segment. Given the presence  of underlying ischemic changes on prior brain MRI, this is suspected to reflect an acute dissection. Mild  atherosclerotic disease elsewhere about the major arterial vasculature of the head and neck as above.  MRI  Patchy acute to early subacute ischemic infarcts involving the mid and lower left cerebellum, with minimal patchy involvement of the left dorsal medulla. 2D Echo pending LDL 88 HgbA1c No results found for requested labs within last 1095 days. VTE prophylaxis - SCDs    Diet   Diet regular Room service appropriate? Yes; Fluid consistency: Thin   No antithrombotic prior to admission, now on aspirin  81 mg daily and clopidogrel  75 mg daily.  Therapy recommendations:  pending Disposition:  pending  Hypertension Permissive hypertension (OK if < 220/120) but gradually normalize in 48-72 hours Long-term BP goal normotensive  Hyperlipidemia Home meds: Pravastatin, resumed in hospital LDL 88, goal < 70 Increase statin Continue statin at discharge  Diabetes type II Controlled Home meds: Januvia HgbA1c pending, goal < 7.0 CBGs Recent Labs    11/07/23 1632  GLUCAP 133*    SSI  Other Stroke Risk Factors Advanced Age >/= 19   Other Active Problems Hypothyroidism Continue home synthroid .  Cellulitis and infection on right mid toe few months w/ purulent drainage Continue doxycycline , wound consult  Bil ABI- Right: Resting right ankle-brachial index is within normal range. Left: Resting left ankle-brachial index is within normal range.  Anxiety Zoloft , ativan  PRN- home regimen  Hospital day # 1   Patient seen and examined by NP/APP with MD. MD to update note as needed.   Jorene Last, DNP, FNP-BC [facility] Pager: [phone]   Attending Neurohospitalist Addendum Patient seen and examined with APP/Resident. Agree with the history and physical as documented above. Agree with the plan as documented, which I helped formulate. I have independently reviewed the chart, obtained history, review of systems and examined the patient.I have personally reviewed  pertinent head/neck/spine imaging (CT/MRI). Please feel free to call with any questions.  -- Eligio Lav, MD Neurologist [facility] Pager: [phone]

## 2023-11-08 NOTE — Progress Notes (Addendum)
 Patient received from [facility] ED, alert and oriented X4, vital signs stable, will continue to monitor

## 2023-11-08 NOTE — Progress Notes (Signed)
 PROGRESS NOTE Stefanie Carter  FMW:[id number] DOB: [birth year] DOA: 11/07/2023 PCP: Ransom Other, MD  Brief Narrative/Hospital Course: 8 F w/ h/o HTN, HLD, DM2, presented to [facility] ED c/o 2 days of vertigo. In the ED afebrile vitals stable, labs with hypokalemia, hemoglobin at 16.1 MRI brain showed patchy acute to early subacute ischemic infarcts of left cerebellum, left dorsal medulla, severe degenerative osteoarthritic changes about C1-2 resulting moderate to severe stenosis. Neurology was consulted and admitted to [facility] for further management     Subjective: Seen this am having meal Vertigo much better some nausea Worried she wll be unsteady when walk   Assessment and Plan: Principal Problem:   Acute ischemic stroke (HCC) Active Problems:   HLD (hyperlipidemia)   Essential hypertension   Vertigo   Hypokalemia   Cellulitis of right lower extremity   DM2 (diabetes mellitus, type 2) (HCC)   GAD (generalized anxiety disorder)   Acquired hypothyroidism   History of COPD   Left cerebellar punctate small strokes: Likely in the setting of left vertebral dissection versus atherosclerotic disease. Appreciate neurology input hold estrogen, completed stroke workup neurochecks echocardiogram risk factor modification  A1c 6.5 goal less than 7, LDL 88 goal<70-will change to high intensity statin Okay for blood pressure control as out of permissive hypertension window. Per neurology aspirin  325 x 1 then 81 daily, Plavix  300 mg load then 75 daily x 90 days CT angio head and neck completed in the ED-ED report, follow-up TTE PTOTSLP to cont  Hypothyroidism: Continue home synthroid .  Cellulitis and infection on right mid toe few months w/ purulent draiange: Continue doxycycline , x-ray right foot-no acute finding- check ABI- wound consult   Essential HTN: BP stable, start antihypertensive if out of range  T2DM: PTA on Januvia, follow-up A1c keep on SSI for now Recent Labs  Lab  11/07/23 1632 11/08/23 0648 11/08/23 0838  GLUCAP 133*  --  134*  HGBA1C  --  6.5*  --     HLD: Follow lipid profile, continue home statin  GAD: Continue Zoloft  and Ativan  prn  COPD: Continue home Breztri inhaler  DVT prophylaxis: SCDs Start: 11/07/23 2147 Code Status:   Code Status: Full Code Family Communication: plan of care discussed with patient at bedside. Patient status is: Remains hospitalized because of severity of illness Level of care: Telemetry Medical   Dispo: The patient is from: lives alone and independent PTA            Anticipated disposition:TBD.  Called [facility] discussed w/ neurology she is on top of transfer list to go to [facility] today  Objective: Vitals last 24 hrs: Vitals:   11/08/23 0214 11/08/23 0600 11/08/23 0645 11/08/23 0756  BP: 113/67 110/72 117/88 130/83  Pulse: 79 79 71 67  Resp: (!) 23 (!) 21 19 18   Temp: 98.2 F (36.8 C)  98.2 F (36.8 C) 97.7 F (36.5 C)  TempSrc: Oral  Oral Oral  SpO2: 96% 96% 95% 93%  Weight:      Height:       Weight change:   Physical Examination: General exam: alert awake,at baseline, older than stated age HEENT:Oral mucosa moist, Ear/Nose WNL grossly Respiratory system: Bilaterally clear BS,no use of accessory muscle Cardiovascular system: S1 & S2 +, No JVD. Gastrointestinal system: Abdomen soft,NT,ND, BS+ Nervous System: Alert, awake, moving all extremities,and following commands. Extremities: rt middle toe tip infected w/ pus,  Skin: No rashes,no icterus. MSK: Normal muscle bulk,tone, power   Medications reviewed:  Scheduled Meds:   stroke:  early stages of recovery book   Does not apply Once   aspirin   81 mg Oral Daily   clopidogrel   300 mg Oral Once   clopidogrel   75 mg Oral Daily   doxycycline   100 mg Oral Q12H   famotidine   20 mg Oral Daily   gabapentin   100 mg Oral TID   insulin  aspart  0-9 Units Subcutaneous TID WC   levothyroxine   125 mcg Oral Q0600   mometasone -formoterol   2 puff Inhalation  BID   potassium chloride   40 mEq Oral Once   sertraline   100 mg Oral Daily   simvastatin   40 mg Oral q1800   umeclidinium bromide   1 puff Inhalation Daily  Continuous Infusions:    Diet Order             Diet regular Room service appropriate? Yes; Fluid consistency: Thin  Diet effective now                  No intake or output data in the 24 hours ending 11/08/23 0818 Net IO Since Admission: No IO data has been entered for this period [11/08/23 0818]  Wt Readings from Last 3 Encounters:  11/07/23 50.4 kg  01/02/22 50.4 kg  01/02/21 51.7 kg     Unresulted Labs (From admission, onward)     Start     Ordered   11/08/23 0500  Sedimentation rate  Tomorrow morning,   R        11/07/23 2257   11/07/23 2312  C-reactive protein  Add-on,   AD        11/07/23 2311   11/07/23 2149  Hemoglobin A1c  (Labs)  Add-on,   AD       Comments: To assess prior glycemic control    11/07/23 2149          Data Reviewed: I have personally reviewed following labs and imaging studies CBC: Recent Labs  Lab 11/07/23 1642 11/08/23 0648  WBC 10.7* 8.8  NEUTROABS  --  5.7  HGB 16.1* 14.2  HCT 46.5* 42.8  MCV 92.8 96.0  PLT 311 291   Basic Metabolic Panel:  Recent Labs  Lab 11/07/23 1642 11/08/23 0648  NA 140 140  K 3.4* 3.4*  CL 105 107  CO2 23 25  GLUCOSE 126* 118*  BUN 17 18  CREATININE 0.62 0.65  CALCIUM  9.9 8.8*  MG  --  2.1   GFR: Estimated Creatinine Clearance: 43 mL/min (by C-G formula based on SCr of 0.65 mg/dL). Liver Function Tests:  Recent Labs  Lab 11/08/23 0648  AST 32  ALT 36  ALKPHOS 66  BILITOT 0.4  PROT 6.1*  ALBUMIN 3.2*   No results for input(s): LIPASE, AMYLASE in the last 168 hours. No results for input(s): AMMONIA in the last 168 hours. Coagulation Profile: No results for input(s): INR, PROTIME in the last 168 hours. No results for input(s): PROBNP in the last 168 hours.  No results for input(s): HGBA1C in the last 72  hours. Recent Labs  Lab 11/07/23 1632  GLUCAP 133*   Recent Labs    11/08/23 0648  CHOL 186  HDL 81  LDLCALC 88  TRIG 83  CHOLHDL 2.3   No results for input(s): TSH, T4TOTAL, FREET4, T3FREE, THYROIDAB in the last 72 hours. Sepsis Labs: No results for input(s): PROCALCITON, LATICACIDVEN in the last 168 hours. Recent Results (from the past 240 hours)  Resp panel by RT-PCR (RSV, Flu A&B, Covid) Anterior Nasal Swab  Status: None   Collection Time: 11/07/23  4:42 PM   Specimen: Anterior Nasal Swab  Result Value Ref Range Status   SARS Coronavirus 2 by RT PCR NEGATIVE NEGATIVE Final    Comment: (NOTE) SARS-CoV-2 target nucleic acids are NOT DETECTED.  The SARS-CoV-2 RNA is generally detectable in upper respiratory specimens during the acute phase of infection. The lowest concentration of SARS-CoV-2 viral copies this assay can detect is 138 copies/mL. A negative result does not preclude SARS-Cov-2 infection and should not be used as the sole basis for treatment or other patient management decisions. A negative result may occur with  improper specimen collection/handling, submission of specimen other than nasopharyngeal swab, presence of viral mutation(s) within the areas targeted by this assay, and inadequate number of viral copies(<138 copies/mL). A negative result must be combined with clinical observations, patient history, and epidemiological information. The expected result is Negative.  Fact Sheet for Patients:  bloggercourse.com  Fact Sheet for Healthcare Providers:  seriousbroker.it  This test is no t yet approved or cleared by the [place]  FDA and  has been authorized for detection and/or diagnosis of SARS-CoV-2 by FDA under an Emergency Use Authorization (EUA). This EUA will remain  in effect (meaning this test can be used) for the duration of the COVID-19 declaration under Section 564(b)(1) of  the Act, 21 U.S.C.section 360bbb-3(b)(1), unless the authorization is terminated  or revoked sooner.       Influenza A by PCR NEGATIVE NEGATIVE Final   Influenza B by PCR NEGATIVE NEGATIVE Final    Comment: (NOTE) The Xpert Xpress SARS-CoV-2/FLU/RSV plus assay is intended as an aid in the diagnosis of influenza from Nasopharyngeal swab specimens and should not be used as a sole basis for treatment. Nasal washings and aspirates are unacceptable for Xpert Xpress SARS-CoV-2/FLU/RSV testing.  Fact Sheet for Patients: bloggercourse.com  Fact Sheet for Healthcare Providers: seriousbroker.it  This test is not yet approved or cleared by the [place]  FDA and has been authorized for detection and/or diagnosis of SARS-CoV-2 by FDA under an Emergency Use Authorization (EUA). This EUA will remain in effect (meaning this test can be used) for the duration of the COVID-19 declaration under Section 564(b)(1) of the Act, 21 U.S.C. section 360bbb-3(b)(1), unless the authorization is terminated or revoked.     Resp Syncytial Virus by PCR NEGATIVE NEGATIVE Final    Comment: (NOTE) Fact Sheet for Patients: bloggercourse.com  Fact Sheet for Healthcare Providers: seriousbroker.it  This test is not yet approved or cleared by the [place]  FDA and has been authorized for detection and/or diagnosis of SARS-CoV-2 by FDA under an Emergency Use Authorization (EUA). This EUA will remain in effect (meaning this test can be used) for the duration of the COVID-19 declaration under Section 564(b)(1) of the Act, 21 U.S.C. section 360bbb-3(b)(1), unless the authorization is terminated or revoked.  Performed at [facility], [address] [place], [place] [address]     Antimicrobials/Microbiology: Anti-infectives (From admission, onward)    Start     Dose/Rate Route  Frequency Ordered Stop   11/07/23 2315  doxycycline  (VIBRA -TABS) tablet 100 mg       Note to Pharmacy: (For cellulitis of right 3rd toe)   100 mg Oral Every 12 hours 11/07/23 2308           Component Value Date/Time   SDES  01/02/2022 1921    URINE, CLEAN CATCH Performed at [facility], [address] [place], [place] [address]  SPECREQUEST  01/02/2022 1921    NONE Performed at [facility], [address], [place], [place] [address]    CULT  01/02/2022 1921    NO GROWTH Performed at [facility], [address], [place], [place] [address]    REPTSTATUS 01/04/2022 FINAL 01/02/2022 1921     Radiology Studies: DG Foot 2 Views Right Result Date: 11/07/2023 CLINICAL DATA:  3rd digit wound EXAM: RIGHT FOOT - 2 VIEW COMPARISON:  None Available. FINDINGS: No fracture or dislocation is seen. Moderate degenerative changes of the 1st MTP joint. No destructive osseous changes to suggest osteomyelitis. The visualized soft tissues are unremarkable. IMPRESSION: No radiographic findings of osteomyelitis. Electronically Signed   By: Pinkie Pebbles M.D.   On: 11/07/2023 23:30   CT ANGIO HEAD NECK W WO CM Result Date: 11/07/2023 CLINICAL DATA:  Follow-up examination for acute stroke. EXAM: CT ANGIOGRAPHY HEAD AND NECK WITH AND WITHOUT CONTRAST TECHNIQUE: Multidetector CT imaging of the head and neck was performed using the standard protocol during bolus administration of intravenous contrast. Multiplanar CT image reconstructions and MIPs were obtained to evaluate the vascular anatomy. Carotid stenosis measurements (when applicable) are obtained utilizing NASCET criteria, using the distal internal carotid diameter as the denominator. RADIATION DOSE REDUCTION: This exam was performed according to the departmental dose-optimization program which includes automated exposure control, adjustment of the mA and/or kV according to patient size and/or use of  iterative reconstruction technique. CONTRAST:  75mL OMNIPAQUE  IOHEXOL  350 MG/ML SOLN COMPARISON:  MRI from earlier the same day. FINDINGS: CT HEAD FINDINGS Brain: Previous identified left cerebellar infarcts again noted, better seen on prior MRI. No other acute large vessel territory infarct. No acute intracranial hemorrhage. No mass lesion or midline shift or mass effect. No hydrocephalus or extra-axial fluid collection. Underlying mild chronic microvascular ischemic disease noted. Vascular: No abnormal hyperdense vessel. Skull: Scalp soft tissues within normal limits for age calvarium intact. Sinuses/Orbits: Globes and orbital soft tissues within normal limits. Paranasal sinuses and mastoid air cells are largely clear. Other: None. Review of the MIP images confirms the above findings CTA NECK FINDINGS Aortic arch: Visualized aortic arch within normal limits for caliber with standard 3 vessel morphology. Moderate aortic atherosclerosis. No significant stenosis about the origin the great vessels. Right carotid system: Right common and internal carotid arteries are patent without dissection. Mild atheromatous change about the right carotid bulb without stenosis. Left carotid system: Left common and internal carotid arteries are patent without dissection. Moderate atheromatous change about the left carotid bulb without hemodynamically significant greater than 50% stenosis. Vertebral arteries: Both vertebral arteries arise from the subclavian arteries. No significant proximal subclavian artery stenosis. Right vertebral artery dominant and patent without significant stenosis or dissection. Left vertebral artery patent proximally. Increasing attenuation of the distal left vertebral artery at the left V3 segment with subsequent occlusion at the left V4 segment (series 16, images 147, 174), accounting for the signal abnormality seen on prior brain MRI. This is suspected to reflect an underlying dissection. Skeleton: Severe  osteoarthritic changes about the C1-2 articulations with degenerative pannus formation. Cystic lucency within the dens is favored to be degenerative in nature. Advanced spondylosis noted elsewhere within the visualized cervicothoracic spine. Other neck: No other acute finding. Upper chest: No other acute finding. Review of the MIP images confirms the above findings CTA HEAD FINDINGS Anterior circulation: Mild atheromatous change about the carotid siphons without hemodynamically significant stenosis. A1 segments patent bilaterally. Normal anterior communicating artery complex. Anterior cerebral arteries  patent without stenosis. No M1 stenosis or occlusion. Distal MCA branches perfused and symmetric. Posterior circulation: Dominant right V4 segment patent without significant stenosis. Right PICA patent. Left vertebral artery markedly attenuated as it crosses the dural reflection with complete occlusion of the left V4 segment by its mid aspect (series 16, image 166). Again, this is suspected to reflect an acute dissection given the underlying ischemic changes. Left PICA not seen. Basilar patent without significant stenosis. Superior cerebral arteries patent bilaterally. Both PCA supplied via hypoplastic P1 segments and robust bilateral posterior communicating arteries. Both PCAs patent without stenosis. Venous sinuses: Patent allowing for timing the contrast bolus. Anatomic variants: As above.  No aneurysm. Review of the MIP images confirms the above findings IMPRESSION: CT HEAD: 1. Evolving acute ischemic nonhemorrhagic left cerebellar infarcts, better seen on prior brain MRI. 2. No other acute intracranial abnormality. CTA HEAD AND NECK: 1. Increasing attenuation of the distal left V3 segment with subsequent reocclusion at the intradural left V4 segment. Given the presence of underlying ischemic changes on prior brain MRI, this is suspected to reflect an acute dissection. 2. Mild atherosclerotic disease elsewhere  about the major arterial vasculature of the head and neck as above. No other hemodynamically significant or correctable stenosis. 3.  Aortic Atherosclerosis (ICD10-I70.0). Electronically Signed   By: Morene Hoard M.D.   On: 11/07/2023 21:55   MR BRAIN WO CONTRAST Result Date: 11/07/2023 CLINICAL DATA:  Initial evaluation for acute dizziness, syncope please/presyncope. EXAM: MRI HEAD WITHOUT CONTRAST TECHNIQUE: Multiplanar, multiecho pulse sequences of the brain and surrounding structures were obtained without intravenous contrast. COMPARISON:  Prior CT from 09/29/2017 FINDINGS: Brain: Mild age-related cerebral atrophy. Patchy T2/FLAIR hyperintensity involving the periventricular deep white matter both cerebral hemispheres as well as the pons, consistent with chronic small vessel ischemic disease, mild in nature. Patchy restricted diffusion involving the mid and lower left cerebellum, consistent with acute to early subacute ischemic infarcts. Largest area infarction measures 1.6 cm. Minimal patchy involvement of the left dorsal medulla (series 5, image 8). No associated hemorrhage or significant mass effect. No other areas of acute or subacute ischemia. Gray-white matter differentiation otherwise maintained. No acute or chronic intracranial blood products. No mass lesion, midline shift or mass effect. No hydrocephalus or extra-axial fluid collection. Pituitary gland suprasellar region within normal limits. Vascular: Loss of normal flow void within the left V4 segment, which could be related to slow flow and/or occlusion (series 8, image 3). Major intracranial vascular flow voids are otherwise maintained. Skull and upper cervical spine: Severe degenerative osteoarthritic changes present about the C1-2 articulation with degenerative pannus formation (series 7, image 12). 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 Bone marrow  signal intensity within normal limits. No scalp soft tissue abnormality. Sinuses/Orbits: Prior bilateral ocular lens replacement. Paranasal sinuses are clear. Small bilateral mastoid effusions noted, of doubtful significance. Negative nasopharynx. Other: None. IMPRESSION: 1. Patchy acute to early subacute ischemic infarcts involving the mid and lower left cerebellum, with minimal patchy involvement of the left dorsal medulla. No associated hemorrhage or significant mass effect. 2. Loss of normal flow void within the left V4 segment, which could be related to slow flow and/or occlusion. 3. Severe degenerative osteoarthritic changes about the C1-2 articulation with degenerative pannus formation. 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 4. Underlying mild age-related cerebral atrophy with chronic small vessel ischemic disease. Electronically Signed  By: Morene Hoard M.D.   On: 11/07/2023 20:30   DG Chest Portable 1 View Result Date: 11/07/2023 CLINICAL DATA:  Cough EXAM: PORTABLE CHEST 1 VIEW COMPARISON:  08/16/2023 FINDINGS: The heart size and mediastinal contours are within normal limits. Aortic atherosclerosis. No focal airspace consolidation, pleural effusion, or pneumothorax. IMPRESSION: No active disease. Electronically Signed   By: Mabel Converse D.O.   On: 11/07/2023 16:34    LOS: 1 day   Total time spent in review of labs and imaging, patient evaluation, formulation of plan, documentation and communication with family: 50 minutes  Mennie LAMY, MD [facility]  11/08/2023, 8:18 AM

## 2023-11-08 NOTE — Progress Notes (Signed)
 2D echo attempted, patient refused.

## 2023-11-08 NOTE — ED Notes (Signed)
 Spoke again with the patient about the importance of taking her medications that were ordered and for what reasons they are to be given as well as the importance of allowing us  to run the ECHO testing. Patient stated that she would comply with medications, and that ECHO could be performed.

## 2023-11-08 NOTE — Evaluation (Signed)
 Speech Language Pathology Evaluation Patient Details Name: Stefanie Carter MRN: [id number] DOB: 1946/01/31 Today's Date: 11/08/2023 Time: 8874-8864 SLP Time Calculation (min) (ACUTE ONLY): 10 min  Problem List:  Patient Active Problem List   Diagnosis Date Noted   Vertigo 11/08/2023   Hypokalemia 11/08/2023   Cellulitis of right lower extremity 11/08/2023   DM2 (diabetes mellitus, type 2) (HCC) 11/08/2023   GAD (generalized anxiety disorder) 11/08/2023   Acquired hypothyroidism 11/08/2023   History of COPD 11/08/2023   Acute ischemic stroke (HCC) 11/07/2023   Spinal stenosis 06/20/2013   HLD (hyperlipidemia) 06/20/2013   Essential hypertension 06/20/2013   Anxiety 06/20/2013   Past Medical History:  Past Medical History:  Diagnosis Date   Anxiety    Diabetes (HCC)    HLD (hyperlipidemia)    HTN (hypertension)    Hypothyroid    Spinal stenosis    Past Surgical History:  Past Surgical History:  Procedure Laterality Date   APPENDECTOMY     SALIVARY GLAND SURGERY     TONSILLECTOMY     HPI:  Patient is a 78 y.o. female with PMH: essential HTN, HLD, DM-2 complicated by diabetic peripheral neuropathy, GAD, COPD, spinal stenosis. She [facility] via EMS following two days of persistent vertigo. MRI brain showed acute to early subacute ischemic infarcts of mid and lower left cerebellum. She was out of the window for tPA and thrombectomy. Plan is for transfer admission to [facility] for further evaluation and management of stroke.   Assessment / Plan / Recommendation Clinical Impression  Patient is not currently presenting with a suspected change in her cognition, language or speech function. She initially appeared with some mild dysarthria, but this quickly resolved during discussion with SLP and appeared to be related to dry oral mucosa. Language abilities (receptive and expressive) both appeared WNL and speech was 100% intelligible at conversational level. Patient adamant  about not wanting to stay in the hospital; she is aware of plan for transfer to [facility] for evaluation of CVA but she told SLP they can get that done quickly and I am not staying. She mentioned I can crawl around at home but did not elaborate on this. Although SLP is not expecting a significant change in cognition, there is question of her ability to care for herself at home. She may benefit from a more in-depth SLP evaluation at next venue of care.    SLP Assessment  SLP Recommendation/Assessment: Patient does not need any further Speech Lanaguage Pathology Services SLP Visit Diagnosis: Cognitive communication deficit (R41.841);Dysarthria and anarthria (R47.1)    Recommendations for follow up therapy are one component of a multi-disciplinary discharge planning process, led by the attending physician.  Recommendations may be updated based on patient status, additional functional criteria and insurance authorization.    Follow Up Recommendations  Follow physician's recommendations for discharge plan and follow up therapies    Assistance Recommended at Discharge  Set up Supervision/Assistance  Functional Status Assessment Patient has not had a recent decline in their functional status  Frequency and Duration     N/A      SLP Evaluation Cognition  Overall Cognitive Status: No family/caregiver present to determine baseline cognitive functioning Arousal/Alertness: Awake/alert Orientation Level: Oriented X4 Behaviors: Verbal agitation       Comprehension  Auditory Comprehension Overall Auditory Comprehension: Appears within functional limits for tasks assessed    Expression Expression Primary Mode of Expression: Verbal Verbal Expression Overall Verbal Expression: Appears within functional limits for tasks assessed   Oral /  Motor  Oral Motor/Sensory Function Overall Oral Motor/Sensory Function: Within functional limits Motor Speech Overall Motor Speech: Appears within functional  limits for tasks assessed Respiration: Within functional limits Resonance: Within functional limits Articulation: Within functional limitis Intelligibility: Intelligible Motor Planning: Witnin functional limits Motor Speech Errors: Not applicable            Norleen IVAR Blase, MA, CCC-SLP Speech Therapy

## 2023-11-08 NOTE — ED Notes (Signed)
 Pt refusing ECHO. MD notified.

## 2023-11-09 ENCOUNTER — Inpatient Hospital Stay (HOSPITAL_COMMUNITY): Payer: Medicare HMO

## 2023-11-09 DIAGNOSIS — I639 Cerebral infarction, unspecified: Secondary | ICD-10-CM | POA: Diagnosis not present

## 2023-11-09 DIAGNOSIS — I6389 Other cerebral infarction: Secondary | ICD-10-CM | POA: Diagnosis not present

## 2023-11-09 LAB — ECHOCARDIOGRAM COMPLETE
AR max vel: 1.33 cm2
AV Area VTI: 1.42 cm2
AV Area mean vel: 1.2 cm2
AV Mean grad: 11.3 mm[Hg]
AV Peak grad: 21.9 mm[Hg]
Ao pk vel: 2.34 m/s
Area-P 1/2: 2.58 cm2
Calc EF: 62.4 %
Height: 60 in
MV VTI: 1.44 cm2
S' Lateral: 2.9 cm
Single Plane A2C EF: 60.4 %
Single Plane A4C EF: 62.4 %
Weight: 1915.36 [oz_av]

## 2023-11-09 LAB — VAS US ABI WITH/WO TBI
Left ABI: 1.12
Right ABI: 1.08

## 2023-11-09 LAB — BASIC METABOLIC PANEL
Anion gap: 6 (ref 5–15)
BUN: 21 mg/dL (ref 8–23)
CO2: 29 mmol/L (ref 22–32)
Calcium: 8.9 mg/dL (ref 8.9–10.3)
Chloride: 107 mmol/L (ref 98–111)
Creatinine, Ser: 1.02 mg/dL — ABNORMAL HIGH (ref 0.44–1.00)
GFR, Estimated: 57 mL/min — ABNORMAL LOW (ref >60)
Glucose, Bld: 102 mg/dL — ABNORMAL HIGH (ref 70–99)
Potassium: 4.2 mmol/L (ref 3.5–5.1)
Sodium: 142 mmol/L (ref 135–145)

## 2023-11-09 LAB — GLUCOSE, CAPILLARY: Glucose-Capillary: 146 mg/dL — ABNORMAL HIGH (ref 70–99)

## 2023-11-09 MED ORDER — CLOPIDOGREL BISULFATE 75 MG PO TABS: 75.0000 mg | ORAL_TABLET | Freq: Every day | ORAL | 0 refills | Status: DC

## 2023-11-09 MED ORDER — LEVOTHYROXINE SODIUM 125 MCG PO TABS: 125.0000 ug | ORAL_TABLET | Freq: Every day | ORAL | Status: DC

## 2023-11-09 MED ORDER — MECLIZINE HCL 25 MG PO TABS: 25.0000 mg | ORAL_TABLET | Freq: Three times a day (TID) | ORAL | 0 refills | Status: DC | PRN

## 2023-11-09 MED ORDER — ATORVASTATIN CALCIUM 80 MG PO TABS: 80.0000 mg | ORAL_TABLET | Freq: Every day | ORAL | 1 refills | Status: DC

## 2023-11-09 MED ORDER — DOXYCYCLINE HYCLATE 100 MG PO TABS: 100.0000 mg | ORAL_TABLET | Freq: Two times a day (BID) | ORAL | 0 refills | Status: AC

## 2023-11-09 MED ORDER — FAMOTIDINE 40 MG PO TABS: 40.0000 mg | ORAL_TABLET | Freq: Every day | ORAL | Status: DC

## 2023-11-09 MED ORDER — DOXYCYCLINE HYCLATE 100 MG PO TABS: 100.0000 mg | ORAL_TABLET | Freq: Two times a day (BID) | ORAL | 0 refills | Status: DC

## 2023-11-09 MED ORDER — BISACODYL 10 MG RE SUPP
10.0000 mg | Freq: Once | RECTAL | Status: AC
  Administered 2023-11-09: 10 mg via RECTAL
  Filled 2023-11-09: qty 1

## 2023-11-09 MED ORDER — ASPIRIN 81 MG PO CHEW: 81.0000 mg | CHEWABLE_TABLET | Freq: Every day | ORAL | 0 refills | Status: DC

## 2023-11-09 NOTE — Plan of Care (Signed)
  Problem: Education: Goal: Knowledge of disease or condition will improve Outcome: Progressing Goal: Knowledge of secondary prevention will improve (MUST DOCUMENT ALL) Outcome: Progressing Goal: Knowledge of patient specific risk factors will improve Loraine Leriche N/A or DELETE if not current risk factor) Outcome: Progressing   Problem: Ischemic Stroke/TIA Tissue Perfusion: Goal: Complications of ischemic stroke/TIA will be minimized Outcome: Progressing

## 2023-11-09 NOTE — TOC Initial Note (Signed)
 Transition of Care [facility]) - Initial/Assessment Note    Patient Details  Name: Stefanie Carter MRN: [id number] Date of Birth: January 26, 1946  Transition of Care [facility]) CM/SW Contact:    Andrez [name], RN Phone Number: 11/09/2023, 12:41 PM  Clinical Narrative:                  Pt is from home alone. She is making phone calls to see if her neighbors/ friends can check on her after d/c.  Pt drives self as needed.  She manages her own medications and denies any issues. Awaiting PT recommendations. No follow up per OT.  Pt is making phone calls also to see about transport home when d/ced.  TOC following.  Expected Discharge Plan: Home/Self Care Barriers to Discharge: Continued Medical Work up   Patient Goals and CMS Choice            Expected Discharge Plan and Services       Living arrangements for the past 2 months: Single Family Home                                      Prior Living Arrangements/Services Living arrangements for the past 2 months: Single Family Home Lives with:: Self Patient language and need for interpreter reviewed:: Yes Do you feel safe going back to the place where you live?: Yes        Care giver support system in place?: No (comment) Current home services: DME (walker/ wheelchair/ shower seat) Criminal Activity/Legal Involvement Pertinent to Current Situation/Hospitalization: No - Comment as needed  Activities of Daily Living      Permission Sought/Granted                  Emotional Assessment Appearance:: Appears stated age Attitude/Demeanor/Rapport: Engaged Affect (typically observed): Accepting Orientation: : Oriented to Self, Oriented to Place, Oriented to  Time, Oriented to Situation   Psych Involvement: No (comment)  Admission diagnosis:  Acute ischemic stroke (HCC) [I63.9] Cerebellar stroke [facility]) [I63.9] Patient Active Problem List   Diagnosis Date Noted   Vertigo 11/08/2023   Hypokalemia 11/08/2023    Cellulitis of right lower extremity 11/08/2023   DM2 (diabetes mellitus, type 2) (HCC) 11/08/2023   GAD (generalized anxiety disorder) 11/08/2023   Acquired hypothyroidism 11/08/2023   History of COPD 11/08/2023   Acute ischemic stroke (HCC) 11/07/2023   Spinal stenosis 06/20/2013   HLD (hyperlipidemia) 06/20/2013   Essential hypertension 06/20/2013   Anxiety 06/20/2013   PCP:  Ransom Other, MD Pharmacy:   [facility] [place], [place] - [address] [place] [address] Phone: [phone] Fax: [phone]  [facility] [address] [place] - [address] [place] [address] Phone: [phone] Fax: [phone]     Social Drivers of Health (SDOH) Social History: SDOH Screenings   Food Insecurity: No Food Insecurity (11/09/2023)  Housing: Low Risk  (11/09/2023)  Transportation Needs: No Transportation Needs (11/09/2023)  Utilities: Not At Risk (11/09/2023)  Social Connections: Socially Isolated (11/08/2023)  Tobacco Use: Medium Risk (11/07/2023)   SDOH Interventions:     Readmission Risk Interventions     No data to display

## 2023-11-09 NOTE — Discharge Summary (Signed)
 Physician Discharge Summary  Stefanie Carter FMW:[id number] DOB: [birth year]  PCP: Ransom Other, MD  Admitted from: Home Discharged to: Home  Admit date: 11/07/2023 Discharge date: 11/09/2023  Recommendations for Outpatient Follow-up:    Follow-up Information     Ransom Other, MD. Schedule an appointment as soon as possible for a visit in 1 week(s).   Specialty: Internal Medicine Why: To be seen with repeat labs (CBC & BMP). Contact information: [address] [place] [address] [phone]         [facility] Follow up.   Specialty: Home Health Services Why: The home health agency will contact you for the first home visit. Contact information: [address] [place] [address] [phone]                Patient advised to take all her prescription and OTC meds for the next PCP review.  There is duplication of her nebulizations for COPD treatment.  She probably has to come off of either the albuterol  neb or the DuoNeb.  Also she is on several OTC supplements.  Home Health: PT and OT.    Equipment/Devices: None    Discharge Condition: Improved and stable.   Code Status: Full Code Diet recommendation:  Discharge Diet Orders (From admission, onward)     Start     Ordered   11/09/23 0000  Diet - low sodium heart healthy        11/09/23 1502   11/09/23 0000  Diet Carb Modified        11/09/23 1502             Discharge Diagnoses:  Principal Problem:   Acute ischemic stroke (HCC) Active Problems:   HLD (hyperlipidemia)   Essential hypertension   Vertigo   Hypokalemia   Cellulitis of right lower extremity   DM2 (diabetes mellitus, type 2) (HCC)   GAD (generalized anxiety disorder)   Acquired hypothyroidism   History of COPD   Brief Summary: 78 year old female, lives alone, independent, drives, medical history significant for hypertension, hyperlipidemia, type II DM, anxiety, former tobacco abuse,  COPD, initially presented to the [facility] ED on 11/07/2023 with complaints of dizziness and inability to walk straight.  She also had extreme nausea with episodes of vomiting.  The symptoms were worse couple days PTA and actually better on the day of the ED visit.  LKW 1/9.  Outside window for TNK.  Imaging confirmed left mid and lower cerebellar infarcts and occlusion at V3 with 3 occlusion at the intradural left V4.  Neurology was consulted.  Patient was transferred to [facility] and completed stroke and therapies evaluation.  Assessment and plan:  Acute ischemic stroke: MRI brain: Patchy acute to early subacute ischemic infarcts involving the mid and lower left cerebellum, with minimal patchy involvement of the left dorsal medulla.  Loss of normal flow void within the left V4 segment, which could be related to slow flow and or occlusion. CTA head and neck: Evolving acute ischemic nonhemorrhagic left cerebellar infarcts, better seen on prior brain MRI.  No other acute intracranial abnormality.  Increasing attenuation of the distal left V3 segment with subsequent reocclusion at the intradural left V4 segment.  Acute dissection was suggested by radiology. 2D echo: LVEF 60 to 65%.  Grade 2 diastolic dysfunction. A1c 6.5 and LDL 88. Not on antithrombotics prior to admission.  Neurology consulted.  Discussed with Dr. Rosemarie.  He feels that her stroke is  secondary to left intracranial vertebral artery occlusion.  Her ataxia significantly improved.  He recommends aspirin  81 Mg daily + clopidogrel  75 Mg daily x 3 months followed by aspirin  alone.  Pravastatin was changed to atorvastatin .  He does not recommend any driving restrictions at this time.  Cleared for DC home.  Outpatient follow-up with neurology in 2 months. Therapies have evaluated and TOC has arranged home health PT and OT.  Hypokalemia Replaced.  Essential hypertension: Controlled off of meds  Hyperlipidemia LDL 88, goal  <70 Home pravastatin changed to atorvastatin  80 Mg daily Periodically follow LFTs as outpatient.  Type II DM, controlled A1c 6.5, not taking Januvia listed on her home meds Outpatient follow-up  Hypothyroidism Continue home dose of levothyroxine   Right fourth toe wound Report of purulent drainage.  However during my exam, noted superficial wound on the medial aspect of the right fourth distal phalanx, suspect due to friction with the third toe.  No purulent drainage, redness, tenderness or fluctuance X-ray without bony involvement Bilateral ABIs normal. Completed 5 days of oral doxycycline , dry dressing change daily and as needed, recommended outpatient podiatry consultation. In the interim outpatient follow-up with PCP.  COPD/former smoker Continue home respiratory.  Patient on both as needed albuterol  and DuoNeb on her med list.  Advised to follow-up with PCP and 1 of these needs to be discontinued.  Moderate to severe cervical spine stenosis Noted on MRI As per neurology, recent interlaminal epidural injection at right C7-T1 Outpatient follow-up.  Anxiety disorder Continue home meds.   Consultations: Neurology  Procedures: None   Discharge Instructions  Discharge Instructions     Ambulatory referral to Neurology   Complete by: As directed    An appointment is requested in approximately: 8 weeks   Call MD for:   Complete by: As directed    Recurrent strokelike symptoms.   Call MD for:  difficulty breathing, headache or visual disturbances   Complete by: As directed    Call MD for:  extreme fatigue   Complete by: As directed    Call MD for:  persistant dizziness or light-headedness   Complete by: As directed    Call MD for:  persistant nausea and vomiting   Complete by: As directed    Call MD for:  redness, tenderness, or signs of infection (pain, swelling, redness, odor or green/yellow discharge around incision site)   Complete by: As directed    Call MD  for:  severe uncontrolled pain   Complete by: As directed    Call MD for:  temperature >100.4   Complete by: As directed    Diet - low sodium heart healthy   Complete by: As directed    Diet Carb Modified   Complete by: As directed    Discharge wound care:   Complete by: As directed    Dry dressing changes daily and as needed to right fourth toe wound.   Increase activity slowly   Complete by: As directed         Medication List     STOP taking these medications    estradiol 0.5 MG tablet Commonly known as: ESTRACE   PRAVASTATIN SODIUM PO   predniSONE  50 MG tablet Commonly known as: DELTASONE    Premarin  0.3 MG tablet Generic drug: estrogens  (conjugated)   sitaGLIPtin 25 MG tablet Commonly known as: JANUVIA       TAKE these medications    acetaminophen  325 MG tablet Commonly known as: TYLENOL  Take 325 mg by mouth  every 6 (six) hours as needed for moderate pain (pain score 4-6).   albuterol  1.25 MG/3ML nebulizer solution Commonly known as: ACCUNEB  Take 1 ampule by nebulization every 6 (six) hours as needed for wheezing or shortness of breath.   aspirin  81 MG chewable tablet Chew 1 tablet (81 mg total) by mouth daily. Start taking on: November 10, 2023   atorvastatin  80 MG tablet Commonly known as: LIPITOR  Take 1 tablet (80 mg total) by mouth daily. Start taking on: November 10, 2023   Breztri Aerosphere 160-9-4.8 MCG/ACT Aero Generic drug: Budeson-Glycopyrrol-Formoterol  Inhale 2 puffs into the lungs in the morning and at bedtime.   caffeine 200 MG Tabs tablet Take 200 mg by mouth daily as needed.   clopidogrel  75 MG tablet Commonly known as: PLAVIX  Take 1 tablet (75 mg total) by mouth daily. Start taking on: November 10, 2023   Co Q10 100 MG Caps Take by mouth.   doxycycline  100 MG tablet Commonly known as: VIBRA -TABS Take 1 tablet (100 mg total) by mouth 2 (two) times daily for 4 days.   famotidine  40 MG tablet Commonly known as: PEPCID  Take  1 tablet (40 mg total) by mouth daily. What changed:  how much to take how to take this when to take this   FISH OIL PO Take 1 capsule by mouth daily.   ibuprofen 200 MG tablet Commonly known as: ADVIL Take 200 mg by mouth every 6 (six) hours as needed for mild pain (pain score 1-3) or moderate pain (pain score 4-6).   ipratropium-albuterol  0.5-2.5 (3) MG/3ML Soln Commonly known as: DUONEB Take 3 mLs by nebulization every 6 (six) hours as needed.   levothyroxine  125 MCG tablet Commonly known as: SYNTHROID  Take 1 tablet (125 mcg total) by mouth daily before breakfast. What changed:  how much to take how to take this when to take this   loratadine 10 MG tablet Commonly known as: CLARITIN Take 10 mg by mouth daily.   LORazepam  0.5 MG tablet Commonly known as: ATIVAN  Take 0.5 mg by mouth every 8 (eight) hours as needed.   Magnesium  200 MG Tabs Take 100 mg by mouth daily.   meclizine  25 MG tablet Commonly known as: ANTIVERT  Take 1 tablet (25 mg total) by mouth 3 (three) times daily as needed for dizziness.   MULTIPLE VITAMIN PO Take by mouth.   pseudoephedrine 30 MG tablet Commonly known as: SUDAFED Take 30 mg by mouth every 6 (six) hours as needed for congestion.   senna 8.6 MG tablet Commonly known as: SENOKOT Take 100 mg by mouth daily.   sertraline  100 MG tablet Commonly known as: ZOLOFT  Take 100 mg by mouth daily.   VITAMIN D (CHOLECALCIFEROL) PO Take by mouth.   vitamin E 200 UNIT capsule Take 200 Units by mouth daily.       Allergies  Allergen Reactions   Penicillins Anaphylaxis   Metformin Hcl Other (See Comments)   Sulfa Antibiotics     Had as a baby; doesn't know reaction.   Sulfamethoxazole-Trimethoprim Other (See Comments)    Other reaction(s): Unknown   Varenicline Other (See Comments)    Other reaction(s): intol   Codeine Nausea And Vomiting      Procedures/Studies: ECHOCARDIOGRAM COMPLETE Result Date: 11/09/2023     ECHOCARDIOGRAM REPORT   Patient Name:   Celie Desrochers Date of Exam: 11/09/2023 Medical Rec #:  [id number]          Height:       60.0 in Accession #:  [id number]         Weight:       119.7 lb Date of Birth:  [birth year]         BSA:          1.501 m Patient Age:    77 years           BP:           121/60 mmHg Patient Gender: F                  HR:           72 bpm. Exam Location:  Inpatient Procedure: 2D Echo, Color Doppler and Cardiac Doppler Indications:    Stroke  History:        Patient has no prior history of Echocardiogram examinations.                 COPD, Signs/Symptoms:Dizziness/Lightheadedness; Risk                 Factors:Hypertension and Diabetes.  Sonographer:    Amy Chionchio Referring Phys: HOWERTER, JUSTIN, B IMPRESSIONS  1. Left ventricular ejection fraction, by estimation, is 60 to 65%. The left ventricle has normal function. The left ventricle has no regional wall motion abnormalities. Left ventricular diastolic parameters are consistent with Grade II diastolic dysfunction (pseudonormalization).  2. Right ventricular systolic function is normal. The right ventricular size is normal.  3. Left atrial size was mild to moderately dilated.  4. The mitral valve is degenerative. Trivial mitral valve regurgitation.  5. There is moderate calcification of the aortic valve. Aortic valve regurgitation is not visualized. Mild aortic valve stenosis. Aortic valve mean gradient measures 11.3 mmHg.  6. The inferior vena cava is normal in size with greater than 50% respiratory variability, suggesting right atrial pressure of 3 mmHg. FINDINGS  Left Ventricle: Left ventricular ejection fraction, by estimation, is 60 to 65%. The left ventricle has normal function. The left ventricle has no regional wall motion abnormalities. The left ventricular internal cavity size was normal in size. There is  no left ventricular hypertrophy. Left ventricular diastolic parameters are consistent with Grade II diastolic  dysfunction (pseudonormalization). Right Ventricle: The right ventricular size is normal. Right ventricular systolic function is normal. Left Atrium: Left atrial size was mild to moderately dilated. Right Atrium: Right atrial size was normal in size. Pericardium: There is no evidence of pericardial effusion. Mitral Valve: The mitral valve is degenerative in appearance. Trivial mitral valve regurgitation. MV peak gradient, 5.3 mmHg. The mean mitral valve gradient is 2.0 mmHg. Tricuspid Valve: Tricuspid valve regurgitation is not demonstrated. Aortic Valve: There is moderate calcification of the aortic valve. Aortic valve regurgitation is not visualized. Mild aortic stenosis is present. Aortic valve mean gradient measures 11.3 mmHg. Aortic valve peak gradient measures 21.9 mmHg. Aortic valve area, by VTI measures 1.42 cm. Aorta: The aortic root is normal in size and structure. Venous: The inferior vena cava is normal in size with greater than 50% respiratory variability, suggesting right atrial pressure of 3 mmHg. IAS/Shunts: No atrial level shunt detected by color flow Doppler.  LEFT VENTRICLE PLAX 2D LVIDd:         4.20 cm     Diastology LVIDs:         2.90 cm     LV e' medial:    6.09 cm/s LV PW:         1.00 cm     LV E/e' medial:  16.6 LV  IVS:        0.90 cm     LV e' lateral:   8.92 cm/s LVOT diam:     2.00 cm     LV E/e' lateral: 11.3 LV SV:         53 LV SV Index:   35 LVOT Area:     3.14 cm  LV Volumes (MOD) LV vol d, MOD A2C: 51.0 ml LV vol d, MOD A4C: 57.7 ml LV vol s, MOD A2C: 20.2 ml LV vol s, MOD A4C: 21.7 ml LV SV MOD A2C:     30.8 ml LV SV MOD A4C:     57.7 ml LV SV MOD BP:      34.4 ml RIGHT VENTRICLE             IVC RV Basal diam:  2.30 cm     IVC diam: 1.40 cm RV S prime:     14.40 cm/s TAPSE (M-mode): 1.9 cm LEFT ATRIUM             Index        RIGHT ATRIUM           Index LA Vol (A2C):   50.3 ml 33.52 ml/m  RA Area:     14.70 cm LA Vol (A4C):   66.9 ml 44.58 ml/m  RA Volume:   34.70 ml  23.12  ml/m LA Biplane Vol: 58.7 ml 39.11 ml/m  AORTIC VALVE                     PULMONIC VALVE AV Area (Vmax):    1.33 cm      PV Vmax:       0.91 m/s AV Area (Vmean):   1.20 cm      PV Peak grad:  3.3 mmHg AV Area (VTI):     1.42 cm AV Vmax:           234.00 cm/s AV Vmean:          156.667 cm/s AV VTI:            0.372 m AV Peak Grad:      21.9 mmHg AV Mean Grad:      11.3 mmHg LVOT Vmax:         99.20 cm/s LVOT Vmean:        59.600 cm/s LVOT VTI:          0.168 m LVOT/AV VTI ratio: 0.45  AORTA Ao Root diam: 2.90 cm MITRAL VALVE                TRICUSPID VALVE MV Area (PHT): 2.58 cm     TR Peak grad:   25.0 mmHg MV Area VTI:   1.44 cm     TR Vmax:        250.00 cm/s MV Peak grad:  5.3 mmHg MV Mean grad:  2.0 mmHg     SHUNTS MV Vmax:       1.15 m/s     Systemic VTI:  0.17 m MV Vmean:      67.2 cm/s    Systemic Diam: 2.00 cm MV Decel Time: 294 msec MV E velocity: 101.00 cm/s MV A velocity: 60.80 cm/s MV E/A ratio:  1.66 [place] signed by Ronal Ross Signature Date/Time: 11/09/2023/10:53:47 AM    Final    VAS US  ABI WITH/WO TBI Result Date: 11/09/2023  LOWER EXTREMITY DOPPLER STUDY Patient Name:  Anastasya Jewell  Date of Exam:  11/08/2023 Medical Rec #: [id number]           Accession #:    [id number] Date of Birth: 1946-07-15          Patient Gender: F Patient Age:   89 years Exam Location:  [facility] Procedure:      VAS US  ABI WITH/WO TBI Referring Phys: RAMESH KC --------------------------------------------------------------------------------  Indications: Ulceration. High Risk Factors: Hypertension, hyperlipidemia, Diabetes, prior CVA.  Comparison Study: None. Performing Technologist: Garnette Rockers  Examination Guidelines: A complete evaluation includes at minimum, Doppler waveform signals and systolic blood pressure reading at the level of bilateral brachial, anterior tibial, and posterior tibial arteries, when vessel segments are accessible. Bilateral testing is considered an  integral part of a complete examination. Photoelectric Plethysmograph (PPG) waveforms and toe systolic pressure readings are included as required and additional duplex testing as needed. Limited examinations for reoccurring indications may be performed as noted.  ABI Findings: +--------+------------------+-----+---------+--------+ Right   Rt Pressure (mmHg)IndexWaveform Comment  +--------+------------------+-----+---------+--------+ [id number]                    triphasic         +--------+------------------+-----+---------+--------+ PTA     135               1.08 biphasic          +--------+------------------+-----+---------+--------+ DP      124               0.99 triphasic         +--------+------------------+-----+---------+--------+ +--------+------------------+-----+---------+-------+ Left    Lt Pressure (mmHg)IndexWaveform Comment +--------+------------------+-----+---------+-------+ [id number]                    triphasic        +--------+------------------+-----+---------+-------+ PTA     138               1.10 triphasic        +--------+------------------+-----+---------+-------+ DP      140               1.12 triphasic        +--------+------------------+-----+---------+-------+ +-------+-----------+-----------+------------+------------+ ABI/TBIToday's ABIToday's TBIPrevious ABIPrevious TBI +-------+-----------+-----------+------------+------------+ Right  1.08                                           +-------+-----------+-----------+------------+------------+ Left   1.12                                           +-------+-----------+-----------+------------+------------+  Summary: Right: Resting right ankle-brachial index is within normal range. Left: Resting left ankle-brachial index is within normal range. *See table(s) above for measurements and observations.  Electronically signed by Penne Colorado MD on 11/09/2023 at 10:39:09 AM.     Final    DG Foot 2 Views Right Result Date: 11/07/2023 CLINICAL DATA:  3rd digit wound EXAM: RIGHT FOOT - 2 VIEW COMPARISON:  None Available. FINDINGS: No fracture or dislocation is seen. Moderate degenerative changes of the 1st MTP joint. No destructive osseous changes to suggest osteomyelitis. The visualized soft tissues are unremarkable. IMPRESSION: No radiographic findings of osteomyelitis. Electronically Signed   By: Pinkie Pebbles M.D.   On: 11/07/2023 23:30   CT ANGIO HEAD NECK W WO CM Result Date: 11/07/2023 CLINICAL DATA:  Follow-up examination for acute stroke. EXAM: CT ANGIOGRAPHY HEAD AND NECK WITH AND WITHOUT CONTRAST TECHNIQUE: Multidetector CT imaging of the head and neck was performed using the standard protocol during bolus administration of intravenous contrast. Multiplanar CT image reconstructions and MIPs were obtained to evaluate the vascular anatomy. Carotid stenosis measurements (when applicable) are obtained utilizing NASCET criteria, using the distal internal carotid diameter as the denominator. RADIATION DOSE REDUCTION: This exam was performed according to the departmental dose-optimization program which includes automated exposure control, adjustment of the mA and/or kV according to patient size and/or use of iterative reconstruction technique. CONTRAST:  75mL OMNIPAQUE  IOHEXOL  350 MG/ML SOLN COMPARISON:  MRI from earlier the same day. FINDINGS: CT HEAD FINDINGS Brain: Previous identified left cerebellar infarcts again noted, better seen on prior MRI. No other acute large vessel territory infarct. No acute intracranial hemorrhage. No mass lesion or midline shift or mass effect. No hydrocephalus or extra-axial fluid collection. Underlying mild chronic microvascular ischemic disease noted. Vascular: No abnormal hyperdense vessel. Skull: Scalp soft tissues within normal limits for age calvarium intact. Sinuses/Orbits: Globes and orbital soft tissues within normal limits. Paranasal  sinuses and mastoid air cells are largely clear. Other: None. Review of the MIP images confirms the above findings CTA NECK FINDINGS Aortic arch: Visualized aortic arch within normal limits for caliber with standard 3 vessel morphology. Moderate aortic atherosclerosis. No significant stenosis about the origin the great vessels. Right carotid system: Right common and internal carotid arteries are patent without dissection. Mild atheromatous change about the right carotid bulb without stenosis. Left carotid system: Left common and internal carotid arteries are patent without dissection. Moderate atheromatous change about the left carotid bulb without hemodynamically significant greater than 50% stenosis. Vertebral arteries: Both vertebral arteries arise from the subclavian arteries. No significant proximal subclavian artery stenosis. Right vertebral artery dominant and patent without significant stenosis or dissection. Left vertebral artery patent proximally. Increasing attenuation of the distal left vertebral artery at the left V3 segment with subsequent occlusion at the left V4 segment (series 16, images 147, 174), accounting for the signal abnormality seen on prior brain MRI. This is suspected to reflect an underlying dissection. Skeleton: Severe osteoarthritic changes about the C1-2 articulations with degenerative pannus formation. Cystic lucency within the dens is favored to be degenerative in nature. Advanced spondylosis noted elsewhere within the visualized cervicothoracic spine. Other neck: No other acute finding. Upper chest: No other acute finding. Review of the MIP images confirms the above findings CTA HEAD FINDINGS Anterior circulation: Mild atheromatous change about the carotid siphons without hemodynamically significant stenosis. A1 segments patent bilaterally. Normal anterior communicating artery complex. Anterior cerebral arteries patent without stenosis. No M1 stenosis or occlusion. Distal MCA  branches perfused and symmetric. Posterior circulation: Dominant right V4 segment patent without significant stenosis. Right PICA patent. Left vertebral artery markedly attenuated as it crosses the dural reflection with complete occlusion of the left V4 segment by its mid aspect (series 16, image 166). Again, this is suspected to reflect an acute dissection given the underlying ischemic changes. Left PICA not seen. Basilar patent without significant stenosis. Superior cerebral arteries patent bilaterally. Both PCA supplied via hypoplastic P1 segments and robust bilateral posterior communicating arteries. Both PCAs patent without stenosis. Venous sinuses: Patent allowing for timing the contrast bolus. Anatomic variants: As above.  No aneurysm. Review of the MIP images confirms the above findings IMPRESSION: CT HEAD: 1. Evolving acute ischemic nonhemorrhagic left cerebellar infarcts, better seen on prior brain MRI. 2. No other acute  intracranial abnormality. CTA HEAD AND NECK: 1. Increasing attenuation of the distal left V3 segment with subsequent reocclusion at the intradural left V4 segment. Given the presence of underlying ischemic changes on prior brain MRI, this is suspected to reflect an acute dissection. 2. Mild atherosclerotic disease elsewhere about the major arterial vasculature of the head and neck as above. No other hemodynamically significant or correctable stenosis. 3.  Aortic Atherosclerosis (ICD10-I70.0). Electronically Signed   By: Morene Hoard M.D.   On: 11/07/2023 21:55   MR BRAIN WO CONTRAST Result Date: 11/07/2023 CLINICAL DATA:  Initial evaluation for acute dizziness, syncope please/presyncope. EXAM: MRI HEAD WITHOUT CONTRAST TECHNIQUE: Multiplanar, multiecho pulse sequences of the brain and surrounding structures were obtained without intravenous contrast. COMPARISON:  Prior CT from 09/29/2017 FINDINGS: Brain: Mild age-related cerebral atrophy. Patchy T2/FLAIR hyperintensity involving  the periventricular deep white matter both cerebral hemispheres as well as the pons, consistent with chronic small vessel ischemic disease, mild in nature. Patchy restricted diffusion involving the mid and lower left cerebellum, consistent with acute to early subacute ischemic infarcts. Largest area infarction measures 1.6 cm. Minimal patchy involvement of the left dorsal medulla (series 5, image 8). No associated hemorrhage or significant mass effect. No other areas of acute or subacute ischemia. Gray-white matter differentiation otherwise maintained. No acute or chronic intracranial blood products. No mass lesion, midline shift or mass effect. No hydrocephalus or extra-axial fluid collection. Pituitary gland suprasellar region within normal limits. Vascular: Loss of normal flow void within the left V4 segment, which could be related to slow flow and/or occlusion (series 8, image 3). Major intracranial vascular flow voids are otherwise maintained. Skull and upper cervical spine: Severe degenerative osteoarthritic changes present about the C1-2 articulation with degenerative pannus formation (series 7, image 12). 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 Bone marrow signal intensity within normal limits. No scalp soft tissue abnormality. Sinuses/Orbits: Prior bilateral ocular lens replacement. Paranasal sinuses are clear. Small bilateral mastoid effusions noted, of doubtful significance. Negative nasopharynx. Other: None. IMPRESSION: 1. Patchy acute to early subacute ischemic infarcts involving the mid and lower left cerebellum, with minimal patchy involvement of the left dorsal medulla. No associated hemorrhage or significant mass effect. 2. Loss of normal flow void within the left V4 segment, which could be related to slow flow and/or occlusion. 3. 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 4. Underlying mild age-related cerebral atrophy with chronic small vessel ischemic disease. Electronically Signed   By: Morene Hoard M.D.   On: 11/07/2023 20:30   DG Chest Portable 1 View Result Date: 11/07/2023 CLINICAL DATA:  Cough EXAM: PORTABLE CHEST 1 VIEW COMPARISON:  08/16/2023 FINDINGS: The heart size and mediastinal contours are within normal limits. Aortic atherosclerosis. No focal airspace consolidation, pleural effusion, or pneumothorax. IMPRESSION: No active disease. Electronically Signed   By: Mabel Converse D.O.   On: 11/07/2023 16:34      Subjective: Reports that her dizziness has significantly improved.  Worked with OT prior to my visit.  No nausea or vomiting.  No pain in her right fourth toe and states that the wound got worse when dressing was removed yesterday.  Hesitant to take a prolonged course of antibiotics, reportedly was on antibiotics recently for unclear indication.  Discharge Exam:  Vitals:   11/08/23 2353 11/09/23  0356 11/09/23 0503 11/09/23 1255  BP: (!) 118/56 121/60  113/67  Pulse: 61 62  (!) 58  Resp: 18 18  16   Temp: 97.7 F (36.5 C) 97.6 F (36.4 C)  98.3 F (36.8 C)  TempSrc: Oral Oral  Oral  SpO2: 96% 93%  94%  Weight:   54.3 kg   Height:        General: Elderly female, moderately built and nourished sitting up comfortably in bed without distress. Cardiovascular: S1 & S2 heard, RRR, S1/S2 +. No murmurs, rubs, gallops or clicks. No JVD or pedal edema.   Respiratory: Clear to auscultation without wheezing, rhonchi or crackles. No increased work of breathing. Abdominal:  Non distended, non tender & soft. No organomegaly or masses appreciated. Normal bowel sounds heard. CNS: Alert and oriented. No focal deficits. Extremities: no edema, no cyanosis.  Small superficial wound on the  medial aspect of the right fourth distal phalanx, appears to be from rubbing to the third toe.  Skin appears macerated.  No purulent drainage or fluctuance.  No erythema or tenderness.    The results of significant diagnostics from this hospitalization (including imaging, microbiology, ancillary and laboratory) are listed below for reference.     Microbiology: Recent Results (from the past 240 hours)  Resp panel by RT-PCR (RSV, Flu A&B, Covid) Anterior Nasal Swab     Status: None   Collection Time: 11/07/23  4:42 PM   Specimen: Anterior Nasal Swab  Result Value Ref Range Status   SARS Coronavirus 2 by RT PCR NEGATIVE NEGATIVE Final    Comment: (NOTE) SARS-CoV-2 target nucleic acids are NOT DETECTED.  The SARS-CoV-2 RNA is generally detectable in upper respiratory specimens during the acute phase of infection. The lowest concentration of SARS-CoV-2 viral copies this assay can detect is 138 copies/mL. A negative result does not preclude SARS-Cov-2 infection and should not be used as the sole basis for treatment or other patient management decisions. A negative result may occur with  improper specimen collection/handling, submission of specimen other than nasopharyngeal swab, presence of viral mutation(s) within the areas targeted by this assay, and inadequate number of viral copies(<138 copies/mL). A negative result must be combined with clinical observations, patient history, and epidemiological information. The expected result is Negative.  Fact Sheet for Patients:  bloggercourse.com  Fact Sheet for Healthcare Providers:  seriousbroker.it  This test is no t yet approved or cleared by the [place]  FDA and  has been authorized for detection and/or diagnosis of SARS-CoV-2 by FDA under an Emergency Use Authorization (EUA). This EUA will remain  in effect (meaning this test can be used) for the duration of the COVID-19  declaration under Section 564(b)(1) of the Act, 21 U.S.C.section 360bbb-3(b)(1), unless the authorization is terminated  or revoked sooner.       Influenza A by PCR NEGATIVE NEGATIVE Final   Influenza B by PCR NEGATIVE NEGATIVE Final    Comment: (NOTE) The Xpert Xpress SARS-CoV-2/FLU/RSV plus assay is intended as an aid in the diagnosis of influenza from Nasopharyngeal swab specimens and should not be used as a sole basis for treatment. Nasal washings and aspirates are unacceptable for Xpert Xpress SARS-CoV-2/FLU/RSV testing.  Fact Sheet for Patients: bloggercourse.com  Fact Sheet for Healthcare Providers: seriousbroker.it  This test is not yet approved or cleared by the [place]  FDA and has been authorized for detection and/or diagnosis of SARS-CoV-2 by FDA under an Emergency Use Authorization (EUA). This EUA will remain in effect (meaning this test  can be used) for the duration of the COVID-19 declaration under Section 564(b)(1) of the Act, 21 U.S.C. section 360bbb-3(b)(1), unless the authorization is terminated or revoked.     Resp Syncytial Virus by PCR NEGATIVE NEGATIVE Final    Comment: (NOTE) Fact Sheet for Patients: bloggercourse.com  Fact Sheet for Healthcare Providers: seriousbroker.it  This test is not yet approved or cleared by the [place]  FDA and has been authorized for detection and/or diagnosis of SARS-CoV-2 by FDA under an Emergency Use Authorization (EUA). This EUA will remain in effect (meaning this test can be used) for the duration of the COVID-19 declaration under Section 564(b)(1) of the Act, 21 U.S.C. section 360bbb-3(b)(1), unless the authorization is terminated or revoked.  Performed at [facility], [address], [place], [place] [address]      Labs: CBC: Recent Labs  Lab 11/07/23 1642 11/08/23 0648  WBC  10.7* 8.8  NEUTROABS  --  5.7  HGB 16.1* 14.2  HCT 46.5* 42.8  MCV 92.8 96.0  PLT 311 291    Basic Metabolic Panel: Recent Labs  Lab 11/07/23 1642 11/08/23 0648 11/09/23 0608  NA 140 140 142  K 3.4* 3.4* 4.2  CL 105 107 107  CO2 23 25 29   GLUCOSE 126* 118* 102*  BUN 17 18 21   CREATININE 0.62 0.65 1.02*  CALCIUM  9.9 8.8* 8.9  MG  --  2.1  --     Liver Function Tests: Recent Labs  Lab 11/08/23 0648  AST 32  ALT 36  ALKPHOS 66  BILITOT 0.4  PROT 6.1*  ALBUMIN 3.2*    CBG: Recent Labs  Lab 11/07/23 1632 11/08/23 0838 11/08/23 1221 11/08/23 1752 11/09/23 1254  GLUCAP 133* 134* 149* 140* 146*    Hgb A1c Recent Labs    11/08/23 0648  HGBA1C 6.5*    Lipid Profile Recent Labs    11/08/23 0648  CHOL 186  HDL 81  LDLCALC 88  TRIG 83  CHOLHDL 2.3     Urinalysis    Component Value Date/Time   COLORURINE YELLOW 11/07/2023 1906   APPEARANCEUR CLEAR 11/07/2023 1906   LABSPEC 1.013 11/07/2023 1906   PHURINE 6.0 11/07/2023 1906   GLUCOSEU NEGATIVE 11/07/2023 1906   HGBUR NEGATIVE 11/07/2023 1906   BILIRUBINUR NEGATIVE 11/07/2023 1906   BILIRUBINUR negative 12/07/2020 1632   KETONESUR 5 (A) 11/07/2023 1906   PROTEINUR NEGATIVE 11/07/2023 1906   UROBILINOGEN 0.2 12/07/2020 1632   NITRITE NEGATIVE 11/07/2023 1906   LEUKOCYTESUR NEGATIVE 11/07/2023 1906    Patient requested that I update her friend Clarita.  Unable to reach [place] via phone, left VM.  Time coordinating discharge: 25 minutes  SIGNED:  Trenda Mar, MD,  FACP, [facility], [facility], [facility]   [facility] [place]     To contact the attending provider between 7A-7P or the covering provider during after hours 7P-7A, please log into the web site [email or web address] and access using universal [place] password for that web site. If you do not have the password, please call the hospital operator.

## 2023-11-09 NOTE — Discharge Instructions (Signed)

## 2023-11-09 NOTE — Evaluation (Signed)
 Occupational Therapy Evaluation Patient Details Name: Stefanie Carter MRN: [id number] DOB: Sep 05, 1946 Today's Date: 11/09/2023   History of Present Illness Stefanie Carter is a 78 yo female who presented to [facility] ED who 2 days of dizziness, transferred to [facility] for CVA workup. MRI revealed Patchy acute to early subacute ischemic infarcts involving the  mid and lower left cerebellum, with minimal patchy involvement of the left dorsal medulla. PMHx: essential HTN, HLD, DM-2 complicated by diabetic peripheral neuropathy, GAD, COPD, spinal stenosis   Clinical Impression   Stefanie Carter was evaluated s/p the above admission list. She lives alone and is indep for ADL/IADLs at baseline, she states she intermittently uses a SPC and had one fall due to the onset of dizziness. Upon evaluation, pt demonstrated mod I- indep ability to complete mobility and ADLs. Pt denied dizziness but reports chronic neck pain and new throat soreness. Pt also presents with verbosity, distractibility, impulsivity and poor attention - she benefited from constant re-direction to evaluation, pt admits this is her cognitive/personality baseline when attempting education on BE FAST (unable to fully educate on stroke signs and symptoms). Pt does not require further acute, or follow up OT services. Recommend discharge back to pt's environment with assist as needed. OT to sign off with appreciation of order, please re-consult if needed.         If plan is discharge home, recommend the following: Assist for transportation;Assistance with cooking/housework    Functional Status Assessment  Patient has had a recent decline in their functional status and demonstrates the ability to make significant improvements in function in a reasonable and predictable amount of time.  Equipment Recommendations  None recommended by OT       Precautions / Restrictions Precautions Precautions: Fall Restrictions Weight Bearing Restrictions Per Provider  Order: No      Mobility Bed Mobility Overal bed mobility: Independent                  Transfers Overall transfer level: Modified independent                 General transfer comment: RW used initially however, pt very impulsive with poor/unsafe RW mgmt and had increased safety without AD      Balance Overall balance assessment: Mild deficits observed, not formally tested       ADL either performed or assessed with clinical judgement   ADL Overall ADL's : Modified independent;At baseline Eating/Feeding: Scientist, Research (medical) Details (indicate cue type and reason): pt in/out caths herself at baseline           General ADL Comments: generalized supervision A provided throughout for safety however pt demonstated mod I ADLs and functional mobility. Pt is impulsive, verbose with poor attention to tasks, anticipate this may be her baseline. SHe denied dizziness throughout.     Vision Baseline Vision/History: 1 Wears glasses Vision Assessment?: No apparent visual deficits     Perception Perception: Within Functional Limits       Praxis Praxis: WFL       Pertinent Vitals/Pain Pain Assessment Pain Assessment: Faces Faces Pain Scale: No hurt Pain Intervention(s): Limited activity within patient's tolerance, Monitored during session     Extremity/Trunk Assessment Upper Extremity Assessment Upper Extremity Assessment: Generalized weakness;Right hand dominant   Lower Extremity Assessment Lower Extremity Assessment: Defer to PT evaluation   Cervical / Trunk Assessment Cervical / Trunk Assessment: Other exceptions Cervical / Trunk Exceptions: chronic stenosis   Communication Communication Communication:  No apparent difficulties   Cognition Arousal: Alert Behavior During Therapy: Impulsive Overall Cognitive Status: No family/caregiver present to determine baseline cognitive functioning       General Comments: Basic orientation is [facility]  verbose, distractable, needs constant re-direction     General Comments  VSS on RA            Home Living Family/patient expects to be discharged to:: Private residence Living Arrangements: Alone Available Help at Discharge: Neighbor Type of Home: House Home Access: Stairs to enter [employer] of Steps: 3-4 Entrance Stairs-Rails: Left Home Layout: One level     Bathroom Shower/Tub: Tub/shower unit;Walk-in shower   Bathroom Toilet: Standard     Home Equipment: Agricultural Consultant (2 wheels);Cane - single point;Shower seat   Additional Comments: has a location manager  Lives With: Alone    Prior Functioning/Environment Prior Level of Function : Independent/Modified Independent             Mobility Comments: indep, no AD at baseline. reports she works out 7 days/wk. one fall recently due to onset of dizziness ADLs Comments: indep ADL/IADLs        OT Problem List: Decreased range of motion;Decreased activity tolerance;Impaired balance (sitting and/or standing);Decreased cognition;Decreased safety awareness      OT Treatment/Interventions: Self-care/ADL training    OT Goals(Current goals can be found in the care plan section) Acute Rehab OT Goals Patient Stated Goal: home today OT Goal Formulation: With patient Time For Goal Achievement: 11/23/23 Potential to Achieve Goals: Good  OT Frequency: Min 1X/week       AM-PAC OT 6 Clicks Daily Activity     Outcome Measure Help from another person eating meals?: None Help from another person taking care of personal grooming?: None Help from another person toileting, which includes using toliet, bedpan, or urinal?: None Help from another person bathing (including washing, rinsing, drying)?: None Help from another person to put on and taking off regular upper body clothing?: None Help from another person to put on and taking off regular lower body clothing?: None 6 Click Score: 24   End of Session Equipment Utilized  During Treatment: Gait belt;Rolling walker (2 wheels) Nurse Communication: Mobility status  Activity Tolerance: Patient tolerated treatment well Patient left: in chair;with chair alarm set;with call bell/phone within reach  OT Visit Diagnosis: Unsteadiness on feet (R26.81);Other abnormalities of gait and mobility (R26.89);Muscle weakness (generalized) (M62.81);History of falling (Z91.81);Dizziness and giddiness (R42)                Time: 9149-9075 OT Time Calculation (min): 34 min Charges:  OT General Charges $OT Visit: 1 Visit OT Evaluation $OT Eval Moderate Complexity: 1 Mod OT Treatments $Self Care/Home Management : 8-22 mins  Stefanie Carter, OTR/L Acute Rehabilitation Services Office [phone] Secure Chat Communication Preferred   Stefanie Carter 11/09/2023, 9:37 AM

## 2023-11-09 NOTE — Progress Notes (Addendum)
 STROKE TEAM PROGRESS NOTE   BRIEF HPI [name] is a 78 y.o. female with history of hypertension, anxiety, diabetes and cervical spinal stenosis presenting with dizziness and a fall at home.  She states that she also had some nausea and vomiting, which has improved.  She was found to have an acute to subacute infarct in the left cerebellum and left dorsal medulla.  Left V4 occlusion with suspicion of dissection was found on CTA, but patient has had no recent traumas or events that could have caused a dissection.  NIH on Admission 2  INTERIM HISTORY/SUBJECTIVE Patient has remained hemodynamically stable and reports improvement in her symptoms. MRI scan shows left medulla and posterior inferior cerebellar infarct with CT angiogram showing left V4 occlusion OBJECTIVE  CBC    Component Value Date/Time   WBC 8.8 11/08/2023 0648   RBC 4.46 11/08/2023 0648   HGB 14.2 11/08/2023 0648   HCT 42.8 11/08/2023 0648   PLT 291 11/08/2023 0648   MCV 96.0 11/08/2023 0648   MCH 31.8 11/08/2023 0648   MCHC 33.2 11/08/2023 0648   RDW 12.6 11/08/2023 0648   LYMPHSABS 2.1 11/08/2023 0648   MONOABS 0.7 11/08/2023 0648   EOSABS 0.2 11/08/2023 0648   BASOSABS 0.1 11/08/2023 0648    BMET    Component Value Date/Time   NA 142 11/09/2023 0608   K 4.2 11/09/2023 0608   CL 107 11/09/2023 0608   CO2 29 11/09/2023 0608   GLUCOSE 102 (H) 11/09/2023 0608   BUN 21 11/09/2023 0608   CREATININE 1.02 (H) 11/09/2023 0608   CALCIUM  8.9 11/09/2023 0608   GFRNONAA 57 (L) 11/09/2023 0608    IMAGING past 24 hours ECHOCARDIOGRAM COMPLETE Result Date: 11/09/2023    ECHOCARDIOGRAM REPORT   Patient Name:   Stefanie Carter Date of Exam: 11/09/2023 Medical Rec #:  [id number]          Height:       60.0 in Accession #:    [id number]         Weight:       119.7 lb Date of Birth:  09-24-1946         BSA:          1.501 m Patient Age:    77 years           BP:           121/60 mmHg Patient Gender: F                   HR:           72 bpm. Exam Location:  Inpatient Procedure: 2D Echo, Color Doppler and Cardiac Doppler Indications:    Stroke  History:        Patient has no prior history of Echocardiogram examinations.                 COPD, Signs/Symptoms:Dizziness/Lightheadedness; Risk                 Factors:Hypertension and Diabetes.  Sonographer:    Amy Chionchio Referring Phys: HOWERTER, JUSTIN, B IMPRESSIONS  1. Left ventricular ejection fraction, by estimation, is 60 to 65%. The left ventricle has normal function. The left ventricle has no regional wall motion abnormalities. Left ventricular diastolic parameters are consistent with Grade II diastolic dysfunction (pseudonormalization).  2. Right ventricular systolic function is normal. The right ventricular size is normal.  3. Left atrial size was mild to moderately dilated.  4. The mitral  valve is degenerative. Trivial mitral valve regurgitation.  5. There is moderate calcification of the aortic valve. Aortic valve regurgitation is not visualized. Mild aortic valve stenosis. Aortic valve mean gradient measures 11.3 mmHg.  6. The inferior vena cava is normal in size with greater than 50% respiratory variability, suggesting right atrial pressure of 3 mmHg. FINDINGS  Left Ventricle: Left ventricular ejection fraction, by estimation, is 60 to 65%. The left ventricle has normal function. The left ventricle has no regional wall motion abnormalities. The left ventricular internal cavity size was normal in size. There is  no left ventricular hypertrophy. Left ventricular diastolic parameters are consistent with Grade II diastolic dysfunction (pseudonormalization). Right Ventricle: The right ventricular size is normal. Right ventricular systolic function is normal. Left Atrium: Left atrial size was mild to moderately dilated. Right Atrium: Right atrial size was normal in size. Pericardium: There is no evidence of pericardial effusion. Mitral Valve: The mitral valve is  degenerative in appearance. Trivial mitral valve regurgitation. MV peak gradient, 5.3 mmHg. The mean mitral valve gradient is 2.0 mmHg. Tricuspid Valve: Tricuspid valve regurgitation is not demonstrated. Aortic Valve: There is moderate calcification of the aortic valve. Aortic valve regurgitation is not visualized. Mild aortic stenosis is present. Aortic valve mean gradient measures 11.3 mmHg. Aortic valve peak gradient measures 21.9 mmHg. Aortic valve area, by VTI measures 1.42 cm. Aorta: The aortic root is normal in size and structure. Venous: The inferior vena cava is normal in size with greater than 50% respiratory variability, suggesting right atrial pressure of 3 mmHg. IAS/Shunts: No atrial level shunt detected by color flow Doppler.  LEFT VENTRICLE PLAX 2D LVIDd:         4.20 cm     Diastology LVIDs:         2.90 cm     LV e' medial:    6.09 cm/s LV PW:         1.00 cm     LV E/e' medial:  16.6 LV IVS:        0.90 cm     LV e' lateral:   8.92 cm/s LVOT diam:     2.00 cm     LV E/e' lateral: 11.3 LV SV:         53 LV SV Index:   35 LVOT Area:     3.14 cm  LV Volumes (MOD) LV vol d, MOD A2C: 51.0 ml LV vol d, MOD A4C: 57.7 ml LV vol s, MOD A2C: 20.2 ml LV vol s, MOD A4C: 21.7 ml LV SV MOD A2C:     30.8 ml LV SV MOD A4C:     57.7 ml LV SV MOD BP:      34.4 ml RIGHT VENTRICLE             IVC RV Basal diam:  2.30 cm     IVC diam: 1.40 cm RV S prime:     14.40 cm/s TAPSE (M-mode): 1.9 cm LEFT ATRIUM             Index        RIGHT ATRIUM           Index LA Vol (A2C):   50.3 ml 33.52 ml/m  RA Area:     14.70 cm LA Vol (A4C):   66.9 ml 44.58 ml/m  RA Volume:   34.70 ml  23.12 ml/m LA Biplane Vol: 58.7 ml 39.11 ml/m  AORTIC VALVE  PULMONIC VALVE AV Area (Vmax):    1.33 cm      PV Vmax:       0.91 m/s AV Area (Vmean):   1.20 cm      PV Peak grad:  3.3 mmHg AV Area (VTI):     1.42 cm AV Vmax:           234.00 cm/s AV Vmean:          156.667 cm/s AV VTI:            0.372 m AV Peak Grad:       21.9 mmHg AV Mean Grad:      11.3 mmHg LVOT Vmax:         99.20 cm/s LVOT Vmean:        59.600 cm/s LVOT VTI:          0.168 m LVOT/AV VTI ratio: 0.45  AORTA Ao Root diam: 2.90 cm MITRAL VALVE                TRICUSPID VALVE MV Area (PHT): 2.58 cm     TR Peak grad:   25.0 mmHg MV Area VTI:   1.44 cm     TR Vmax:        250.00 cm/s MV Peak grad:  5.3 mmHg MV Mean grad:  2.0 mmHg     SHUNTS MV Vmax:       1.15 m/s     Systemic VTI:  0.17 m MV Vmean:      67.2 cm/s    Systemic Diam: 2.00 cm MV Decel Time: 294 msec MV E velocity: 101.00 cm/s MV A velocity: 60.80 cm/s MV E/A ratio:  1.66 [place] signed by Ronal Ross Signature Date/Time: 11/09/2023/10:53:47 AM    Final    VAS US  ABI WITH/WO TBI Result Date: 11/09/2023  LOWER EXTREMITY DOPPLER STUDY Patient Name:  Denyla Cortese  Date of Exam:   11/08/2023 Medical Rec #: [id number]           Accession #:    [id number] Date of Birth: [birth year]          Patient Gender: F Patient Age:   72 years Exam Location:  [facility] Procedure:      VAS US  ABI WITH/WO TBI Referring Phys: RAMESH KC --------------------------------------------------------------------------------  Indications: Ulceration. High Risk Factors: Hypertension, hyperlipidemia, Diabetes, prior CVA.  Comparison Study: None. Performing Technologist: Garnette Rockers  Examination Guidelines: A complete evaluation includes at minimum, Doppler waveform signals and systolic blood pressure reading at the level of bilateral brachial, anterior tibial, and posterior tibial arteries, when vessel segments are accessible. Bilateral testing is considered an integral part of a complete examination. Photoelectric Plethysmograph (PPG) waveforms and toe systolic pressure readings are included as required and additional duplex testing as needed. Limited examinations for reoccurring indications may be performed as noted.  ABI Findings: +--------+------------------+-----+---------+--------+ Right   Rt  Pressure (mmHg)IndexWaveform Comment  +--------+------------------+-----+---------+--------+ [id number]                    triphasic         +--------+------------------+-----+---------+--------+ PTA     135               1.08 biphasic          +--------+------------------+-----+---------+--------+ DP      124               0.99 triphasic         +--------+------------------+-----+---------+--------+ +--------+------------------+-----+---------+-------+  Left    Lt Pressure (mmHg)IndexWaveform Comment +--------+------------------+-----+---------+-------+ [id number]                    triphasic        +--------+------------------+-----+---------+-------+ PTA     138               1.10 triphasic        +--------+------------------+-----+---------+-------+ DP      140               1.12 triphasic        +--------+------------------+-----+---------+-------+ +-------+-----------+-----------+------------+------------+ ABI/TBIToday's ABIToday's TBIPrevious ABIPrevious TBI +-------+-----------+-----------+------------+------------+ Right  1.08                                           +-------+-----------+-----------+------------+------------+ Left   1.12                                           +-------+-----------+-----------+------------+------------+  Summary: Right: Resting right ankle-brachial index is within normal range. Left: Resting left ankle-brachial index is within normal range. *See table(s) above for measurements and observations.  Electronically signed by Penne Colorado MD on 11/09/2023 at 10:39:09 AM.    Final     Vitals:   11/08/23 1743 11/08/23 2353 11/09/23 0356 11/09/23 0503  BP: 118/73 (!) 118/56 121/60   Pulse: 64 61 62   Resp: 16 18 18    Temp:  97.7 F (36.5 C) 97.6 F (36.4 C)   TempSrc: Oral Oral Oral   SpO2: 96% 96% 93%   Weight:    54.3 kg  Height:         PHYSICAL EXAM General:  Alert, well-nourished,  well-developed pleasant elderly Caucasian lady in no acute distress Psych:  Mood and affect appropriate for situation CV: Regular rate and rhythm on monitor Respiratory:  Regular, unlabored respirations on room air   NEURO:  Mental Status: AA&Ox3, patient is able to give clear and coherent history Speech/Language: speech is without dysarthria or aphasia.    Cranial Nerves:  II: PERRL. Visual fields full.  III, IV, VI: EOMI. Eyelids elevate symmetrically.  No nystagmus or saccadic dysmetria V: Sensation is intact to light touch and symmetrical to face.  VII: Face is symmetrical resting and smiling VIII: hearing intact to voice. IX, X: Phonation is normal.  XII: tongue is midline without fasciculations. Motor: Able to move all 4 extremities with good antigravity strength Tone: is normal and bulk is normal Sensation- Intact to light touch bilaterally. ] Coordination: FTN intact bilaterally, HKS with some ataxia on the left Gait- deferred  Most Recent NIH  1a Level of Conscious.: 0 1b LOC Questions: 0 1c LOC Commands: 0 2 Best Gaze: 0 3 Visual: 0 4 Facial Palsy: 0 5a Motor Arm - left: 0 5b Motor Arm - Right: 0 6a Motor Leg - Left: 0 6b Motor Leg - Right: 0 7 Limb Ataxia: 1 8 Sensory: 0 9 Best Language: 0 10 Dysarthria: 0 11 Extinct. and Inatten.: 0 TOTAL: 1   ASSESSMENT/PLAN  Acute Ischemic Infarct:  left cerebellar infarcts with patchy involvement of left dorsal medulla Etiology: Left V4 occlusion likely large vessel disease CT head acute left cerebellar infarcts CTA head & neck occlusion of intradural left V4 segment MRI patchy acute to early subacute  infarct in the mid left lower cerebellum with minimal patchy involvement of left dorsal medulla, severe degenerative osteoarthritic changes about C1-C2 articulation, mild atrophy and chronic small vessel ischemic disease 2D Echo EF 60 to 65%, grade 2 diastolic dysfunction, mild to moderately dilated left atrium, no atrial  level shunt LDL 88 HgbA1c 6.5 VTE prophylaxis -SCDs No antithrombotic prior to admission, now on aspirin  81 mg daily and clopidogrel  75 mg daily for months and then aspirin  alone. Therapy recommendations:  Pending Disposition: Pending  Hypertension Home meds: None Stable Blood Pressure Goal: BP less than 220/110   Hyperlipidemia Home meds: Pravastatin 80 mg daily, changed to atorvastatin  80 mg daily LDL 88, goal < 70 Continue statin at discharge  Diabetes type II Controlled Home meds: Januvia 25 mg daily, patient reported not to be taking HgbA1c 6.5, goal < 7.0 CBGs SSI Recommend close follow-up with PCP for better DM control  Other Stroke Risk Factors None   Other Active Problems Cervical spinal stenosis with recent interlaminal epidural injection at right C7-T1  Hospital day # 2  Patient seen by NP with MD, MD to edit note as needed. Cortney E Everitt Clint Kill , MSN, AGACNP-BC [facility] See Amion for schedule and pager information 11/09/2023 11:05 AM   STROKE MD NOTE :  I have personally obtained history,examined this patient, reviewed notes, independently viewed imaging studies, participated in medical decision making and plan of care.ROS completed by me personally and pertinent positives fully documented  I have made any additions or clarifications directly to the above note. Agree with note above.  Patient presented with 2 to 2-day history of dizziness nausea and vomiting secondary to left cerebellar and dorsal medullary infarct likely from left intracranial vertebral artery occlusion.  Recommend dual antiplatelet therapy aspirin  Plavix  for 3 months followed by aspirin  alone and aggressive risk factor modification.  Continue ongoing stroke workup.  Mobilize out of bed.  Therapy consults.  Discussed with Dr. Judeth.  Greater than 50% time during this 50-minute visit was spent in counseling and coordination of care and discussion patient and care team and  answering questions.  Eather Popp, MD Medical Director [facility] Pager: [phone] 11/09/2023 2:57 PM   To contact Stroke Continuity provider, please refer to [email or web address]. After hours, contact General Neurology

## 2023-11-09 NOTE — Progress Notes (Signed)
 Discharge education/instructions provided to patient, patient verbalized understanding and in agreement.   VSS. RA. Respirations are even and unlabored. NAD. PIV removed, tip intact.   All personal belongings and AVS sent with patient.

## 2023-11-09 NOTE — Evaluation (Signed)
 Physical Therapy Evaluation Patient Details Name: Stefanie Carter MRN: [id number] DOB: Sep 24, 1946 Today's Date: 11/09/2023  History of Present Illness  Stefanie Carter is a 78 yo female who presented to [facility] ED who 2 days of dizziness, transferred to [facility] for CVA workup. MRI revealed Patchy acute to early subacute ischemic infarcts involving the  mid and lower left cerebellum, with minimal patchy involvement of the left dorsal medulla. PMHx: essential HTN, HLD, DM-2 complicated by diabetic peripheral neuropathy, GAD, COPD, spinal stenosis  Clinical Impression  PTA pt living alone with pet bird in single story home with 4 steps to enter. Pt reports independence with mobility sometimes using cane when she feels dizzy lately. Reports working out regularly. Pt is currently limited in safe mobility by impulsiveness and poor safety awareness, in presence of dizziness/decrease balance. Pt is independent with bed mobility and mod I for transfers. Pt ambulated with RW and SPC and has better concept of safe mobilization with SPC (Pt has both at home) Pt very distractible both internally and externally so difficult to perform standardized balance training. PT recommends HHPT for balance training and safety in her home environment. PT will continue to follow acutely.      If plan is discharge home, recommend the following: A little help with walking and/or transfers;Assist for transportation;Supervision due to cognitive status   Can travel by private vehicle    Yes    Equipment Recommendations None recommended by PT     Functional Status Assessment Patient has had a recent decline in their functional status and demonstrates the ability to make significant improvements in function in a reasonable and predictable amount of time.     Precautions / Restrictions Precautions Precautions: Fall Restrictions Weight Bearing Restrictions Per Provider Order: No      Mobility  Bed Mobility Overal bed  mobility: Independent                  Transfers Overall transfer level: Modified independent                 General transfer comment: able to power up and self steady, once up very impulsively turns around to hide items in her bed under the bed pad, reports dizziness but then states that she has a lot of dizziness,    Ambulation/Gait Ambulation/Gait assistance: Contact guard assist Gait Distance (Feet): 200 Feet Assistive device: Rolling walker (2 wheels), Straight cane Gait Pattern/deviations: Step-through pattern       General Gait Details: pt with very variable ambulation at times perfectly fine with RW sometimes not paying attention and only using one UE to operate. Pt with slightly more overall safety with SPC, which pt reports she has in every room.  Stairs Stairs: Yes Stairs assistance: Supervision Stair Management: Two rails, Alternating pattern Number of Stairs: 5 General stair comments: with use of bilateral rails pt with good step over step ascent/descent of steps        Balance Overall balance assessment: Mild deficits observed, not formally tested                                           Pertinent Vitals/Pain Pain Assessment Pain Assessment: Faces Faces Pain Scale: No hurt    Home Living Family/patient expects to be discharged to:: Private residence Living Arrangements: Alone Available Help at Discharge: Neighbor Type of Home: House Home Access: Stairs to enter  Entrance Stairs-Rails: Left Entrance Stairs-Number of Steps: 3-4   Home Layout: One level Home Equipment: Agricultural Consultant (2 wheels);Cane - single point;Shower seat Additional Comments: has a location manager    Prior Function Prior Level of Function : Independent/Modified Independent             Mobility Comments: indep, no AD at baseline. reports she works out 7 days/wk. one fall recently due to onset of dizziness ADLs Comments: indep ADL/IADLs      Extremity/Trunk Assessment   Upper Extremity Assessment Upper Extremity Assessment: Defer to OT evaluation    Lower Extremity Assessment Lower Extremity Assessment: Generalized weakness    Cervical / Trunk Assessment Cervical / Trunk Assessment: Other exceptions Cervical / Trunk Exceptions: chronic stenosis  Communication   Communication Communication: No apparent difficulties  Cognition Arousal: Alert Behavior During Therapy: Impulsive Overall Cognitive Status: No family/caregiver present to determine baseline cognitive functioning                                 General Comments: Basic orientation is [facility]. verbose, distractable, needs constant re-direction, poor command follow        General Comments General comments (skin integrity, edema, etc.): VSS on RA, educated on BE FAST symptomolgy however decreased carry over        Assessment/Plan    PT Assessment Patient needs continued PT services  PT Problem List Decreased balance;Decreased safety awareness;Decreased knowledge of use of DME;Decreased cognition       PT Treatment Interventions DME instruction;Gait training;Stair training;Functional mobility training;Therapeutic activities    PT Goals (Current goals can be found in the Care Plan section)  Acute Rehab PT Goals PT Goal Formulation: With patient Time For Goal Achievement: 11/23/23 Potential to Achieve Goals: Fair    Frequency Min 1X/week        AM-PAC PT 6 Clicks Mobility  Outcome Measure Help needed turning from your back to your side while in a flat bed without using bedrails?: None Help needed moving from lying on your back to sitting on the side of a flat bed without using bedrails?: None Help needed moving to and from a bed to a chair (including a wheelchair)?: A Little Help needed standing up from a chair using your arms (e.g., wheelchair or bedside chair)?: A Little Help needed to walk in hospital room?: A Little Help  needed climbing 3-5 steps with a railing? : None 6 Click Score: 21    End of Session Equipment Utilized During Treatment: Gait belt Activity Tolerance: Patient tolerated treatment well Patient left: in bed;with call bell/phone within reach;with bed alarm set Nurse Communication: Mobility status PT Visit Diagnosis: Unsteadiness on feet (R26.81);Other abnormalities of gait and mobility (R26.89);Dizziness and giddiness (R42);Other symptoms and signs involving the nervous system (R29.898)    Time: 8641-8582 PT Time Calculation (min) (ACUTE ONLY): 19 min   Charges:   PT Evaluation $PT Eval Moderate Complexity: 1 Mod   PT General Charges $$ ACUTE PT VISIT: 1 Visit         Dereonna Lensing B. Fleeta Lapidus PT, DPT Acute Rehabilitation Services Please use secure chat or  Call Office [phone]   Almarie KATHEE Fleeta [facility] 11/09/2023, 3:02 PM

## 2023-11-09 NOTE — TOC Transition Note (Signed)
 Transition of Care [facility]) - Discharge Note   Patient Details  Name: Stefanie Carter MRN: [id number] Date of Birth: Aug 16, 1946  Transition of [facility]) CM/SW Contact:  Andrez [name], RN Phone Number: 11/09/2023, 3:22 PM   Clinical Narrative:     Patient is discharging home with home health services through [facility]. Information on the AVS. [facility] will contact her for the first home visit. Pt has needed DME at home. Pt has arranged transport home.  Final next level of care: Home w Home Health Services Barriers to Discharge: No Barriers Identified   Patient Goals and CMS Choice   CMS Medicare.gov Compare Post Acute Care list provided to:: Patient Choice offered to / list presented to : Patient      Discharge Placement                       Discharge Plan and Services Additional resources added to the After Visit Summary for                            [facility] Arranged: PT, OT [facility] Agency: [facility] Date [facility] Agency Contacted: 11/09/23   Representative spoke with at [facility] Agency: [place]  Social Drivers of Health (SDOH) Interventions SDOH Screenings   Food Insecurity: No Food Insecurity (11/09/2023)  Housing: Low Risk  (11/09/2023)  Transportation Needs: No Transportation Needs (11/09/2023)  Utilities: Not At Risk (11/09/2023)  Social Connections: Socially Isolated (11/08/2023)  Tobacco Use: Medium Risk (11/07/2023)     Readmission Risk Interventions     No data to display

## 2023-11-09 NOTE — Progress Notes (Signed)
 PT Cancellation Note  Patient Details Name: Stefanie Carter MRN: [id number] DOB: 1946-04-04   Cancelled Treatment:    Reason Eval/Treat Not Completed: (P) Patient at procedure or test/unavailable Pt off floor for procedure. PT will follow back this afternoon for Eval.   Stefanie Carter PT, DPT Acute Rehabilitation Services Please use secure chat or  Call Office [phone]    Stefanie Carter [facility] 11/09/2023, 9:59 AM

## 2023-11-09 NOTE — Progress Notes (Signed)
 Patient sent off floor for ordered Echocardiogram.

## 2023-11-11 DIAGNOSIS — M4802 Spinal stenosis, cervical region: Secondary | ICD-10-CM | POA: Diagnosis not present

## 2023-11-11 DIAGNOSIS — R2681 Unsteadiness on feet: Secondary | ICD-10-CM | POA: Diagnosis not present

## 2023-11-11 DIAGNOSIS — I10 Essential (primary) hypertension: Secondary | ICD-10-CM | POA: Diagnosis not present

## 2023-11-11 DIAGNOSIS — J449 Chronic obstructive pulmonary disease, unspecified: Secondary | ICD-10-CM | POA: Diagnosis not present

## 2023-11-11 DIAGNOSIS — E119 Type 2 diabetes mellitus without complications: Secondary | ICD-10-CM | POA: Diagnosis not present

## 2023-11-11 DIAGNOSIS — I69398 Other sequelae of cerebral infarction: Secondary | ICD-10-CM | POA: Diagnosis not present

## 2023-11-11 DIAGNOSIS — L03115 Cellulitis of right lower limb: Secondary | ICD-10-CM | POA: Diagnosis not present

## 2023-11-11 DIAGNOSIS — F411 Generalized anxiety disorder: Secondary | ICD-10-CM | POA: Diagnosis not present

## 2023-11-11 DIAGNOSIS — I69393 Ataxia following cerebral infarction: Secondary | ICD-10-CM | POA: Diagnosis not present

## 2023-11-18 DIAGNOSIS — R2681 Unsteadiness on feet: Secondary | ICD-10-CM | POA: Diagnosis not present

## 2023-11-18 DIAGNOSIS — L03115 Cellulitis of right lower limb: Secondary | ICD-10-CM | POA: Diagnosis not present

## 2023-11-18 DIAGNOSIS — J449 Chronic obstructive pulmonary disease, unspecified: Secondary | ICD-10-CM | POA: Diagnosis not present

## 2023-11-18 DIAGNOSIS — I10 Essential (primary) hypertension: Secondary | ICD-10-CM | POA: Diagnosis not present

## 2023-11-18 DIAGNOSIS — E119 Type 2 diabetes mellitus without complications: Secondary | ICD-10-CM | POA: Diagnosis not present

## 2023-11-18 DIAGNOSIS — F411 Generalized anxiety disorder: Secondary | ICD-10-CM | POA: Diagnosis not present

## 2023-11-18 DIAGNOSIS — M4802 Spinal stenosis, cervical region: Secondary | ICD-10-CM | POA: Diagnosis not present

## 2023-11-18 DIAGNOSIS — I69398 Other sequelae of cerebral infarction: Secondary | ICD-10-CM | POA: Diagnosis not present

## 2023-11-18 DIAGNOSIS — I69393 Ataxia following cerebral infarction: Secondary | ICD-10-CM | POA: Diagnosis not present

## 2023-11-19 DIAGNOSIS — Z09 Encounter for follow-up examination after completed treatment for conditions other than malignant neoplasm: Secondary | ICD-10-CM | POA: Diagnosis not present

## 2023-11-19 DIAGNOSIS — R1310 Dysphagia, unspecified: Secondary | ICD-10-CM | POA: Diagnosis not present

## 2023-11-19 DIAGNOSIS — J449 Chronic obstructive pulmonary disease, unspecified: Secondary | ICD-10-CM | POA: Diagnosis not present

## 2023-11-19 DIAGNOSIS — E1169 Type 2 diabetes mellitus with other specified complication: Secondary | ICD-10-CM | POA: Diagnosis not present

## 2023-11-19 DIAGNOSIS — G9520 Unspecified cord compression: Secondary | ICD-10-CM | POA: Diagnosis not present

## 2023-11-19 DIAGNOSIS — R471 Dysarthria and anarthria: Secondary | ICD-10-CM | POA: Diagnosis not present

## 2023-11-19 DIAGNOSIS — I509 Heart failure, unspecified: Secondary | ICD-10-CM | POA: Diagnosis not present

## 2023-11-19 DIAGNOSIS — F331 Major depressive disorder, recurrent, moderate: Secondary | ICD-10-CM | POA: Diagnosis not present

## 2023-11-19 DIAGNOSIS — Z8673 Personal history of transient ischemic attack (TIA), and cerebral infarction without residual deficits: Secondary | ICD-10-CM | POA: Diagnosis not present

## 2023-11-24 DIAGNOSIS — R339 Retention of urine, unspecified: Secondary | ICD-10-CM | POA: Diagnosis not present

## 2023-11-26 DIAGNOSIS — I10 Essential (primary) hypertension: Secondary | ICD-10-CM | POA: Diagnosis not present

## 2023-11-26 DIAGNOSIS — I69393 Ataxia following cerebral infarction: Secondary | ICD-10-CM | POA: Diagnosis not present

## 2023-11-26 DIAGNOSIS — J449 Chronic obstructive pulmonary disease, unspecified: Secondary | ICD-10-CM | POA: Diagnosis not present

## 2023-11-26 DIAGNOSIS — I69398 Other sequelae of cerebral infarction: Secondary | ICD-10-CM | POA: Diagnosis not present

## 2023-11-26 DIAGNOSIS — R2681 Unsteadiness on feet: Secondary | ICD-10-CM | POA: Diagnosis not present

## 2023-11-26 DIAGNOSIS — M4802 Spinal stenosis, cervical region: Secondary | ICD-10-CM | POA: Diagnosis not present

## 2023-11-26 DIAGNOSIS — E119 Type 2 diabetes mellitus without complications: Secondary | ICD-10-CM | POA: Diagnosis not present

## 2023-11-26 DIAGNOSIS — L03115 Cellulitis of right lower limb: Secondary | ICD-10-CM | POA: Diagnosis not present

## 2023-11-26 DIAGNOSIS — F411 Generalized anxiety disorder: Secondary | ICD-10-CM | POA: Diagnosis not present

## 2023-12-01 ENCOUNTER — Encounter (HOSPITAL_COMMUNITY): Payer: Self-pay

## 2023-12-01 ENCOUNTER — Emergency Department (HOSPITAL_COMMUNITY): Payer: Medicare HMO

## 2023-12-01 ENCOUNTER — Observation Stay (HOSPITAL_COMMUNITY): Payer: Medicare HMO

## 2023-12-01 ENCOUNTER — Other Ambulatory Visit: Payer: Self-pay

## 2023-12-01 ENCOUNTER — Observation Stay (HOSPITAL_COMMUNITY)
Admission: EM | Admit: 2023-12-01 | Discharge: 2023-12-02 | Disposition: A | Discharge status: Home / Self Care | Payer: Medicare HMO | Attending: Internal Medicine | Admitting: Internal Medicine

## 2023-12-01 DIAGNOSIS — Z7982 Long term (current) use of aspirin: Secondary | ICD-10-CM | POA: Diagnosis not present

## 2023-12-01 DIAGNOSIS — I6509 Occlusion and stenosis of unspecified vertebral artery: Secondary | ICD-10-CM | POA: Diagnosis not present

## 2023-12-01 DIAGNOSIS — I10 Essential (primary) hypertension: Secondary | ICD-10-CM | POA: Diagnosis not present

## 2023-12-01 DIAGNOSIS — R42 Dizziness and giddiness: Secondary | ICD-10-CM | POA: Diagnosis not present

## 2023-12-01 DIAGNOSIS — R55 Syncope and collapse: Secondary | ICD-10-CM | POA: Diagnosis not present

## 2023-12-01 DIAGNOSIS — Z87891 Personal history of nicotine dependence: Secondary | ICD-10-CM | POA: Diagnosis not present

## 2023-12-01 DIAGNOSIS — Z9889 Other specified postprocedural states: Secondary | ICD-10-CM | POA: Diagnosis not present

## 2023-12-01 DIAGNOSIS — F411 Generalized anxiety disorder: Secondary | ICD-10-CM | POA: Diagnosis not present

## 2023-12-01 DIAGNOSIS — Z8673 Personal history of transient ischemic attack (TIA), and cerebral infarction without residual deficits: Secondary | ICD-10-CM | POA: Insufficient documentation

## 2023-12-01 DIAGNOSIS — E782 Mixed hyperlipidemia: Secondary | ICD-10-CM

## 2023-12-01 DIAGNOSIS — R739 Hyperglycemia, unspecified: Secondary | ICD-10-CM

## 2023-12-01 DIAGNOSIS — I639 Cerebral infarction, unspecified: Secondary | ICD-10-CM | POA: Diagnosis not present

## 2023-12-01 DIAGNOSIS — R270 Ataxia, unspecified: Principal | ICD-10-CM | POA: Insufficient documentation

## 2023-12-01 DIAGNOSIS — I63233 Cerebral infarction due to unspecified occlusion or stenosis of bilateral carotid arteries: Secondary | ICD-10-CM | POA: Diagnosis not present

## 2023-12-01 DIAGNOSIS — I499 Cardiac arrhythmia, unspecified: Secondary | ICD-10-CM | POA: Diagnosis not present

## 2023-12-01 DIAGNOSIS — R262 Difficulty in walking, not elsewhere classified: Secondary | ICD-10-CM | POA: Diagnosis present

## 2023-12-01 DIAGNOSIS — E785 Hyperlipidemia, unspecified: Secondary | ICD-10-CM | POA: Diagnosis present

## 2023-12-01 DIAGNOSIS — I6502 Occlusion and stenosis of left vertebral artery: Secondary | ICD-10-CM | POA: Diagnosis not present

## 2023-12-01 DIAGNOSIS — E119 Type 2 diabetes mellitus without complications: Secondary | ICD-10-CM | POA: Diagnosis not present

## 2023-12-01 DIAGNOSIS — E1142 Type 2 diabetes mellitus with diabetic polyneuropathy: Secondary | ICD-10-CM | POA: Diagnosis not present

## 2023-12-01 DIAGNOSIS — J449 Chronic obstructive pulmonary disease, unspecified: Secondary | ICD-10-CM | POA: Diagnosis not present

## 2023-12-01 DIAGNOSIS — R7401 Elevation of levels of liver transaminase levels: Secondary | ICD-10-CM

## 2023-12-01 DIAGNOSIS — Z7902 Long term (current) use of antithrombotics/antiplatelets: Secondary | ICD-10-CM | POA: Insufficient documentation

## 2023-12-01 DIAGNOSIS — Z8709 Personal history of other diseases of the respiratory system: Secondary | ICD-10-CM

## 2023-12-01 DIAGNOSIS — R41841 Cognitive communication deficit: Secondary | ICD-10-CM | POA: Diagnosis not present

## 2023-12-01 DIAGNOSIS — I700 Atherosclerosis of aorta: Secondary | ICD-10-CM | POA: Diagnosis not present

## 2023-12-01 DIAGNOSIS — R112 Nausea with vomiting, unspecified: Secondary | ICD-10-CM | POA: Diagnosis present

## 2023-12-01 DIAGNOSIS — E039 Hypothyroidism, unspecified: Secondary | ICD-10-CM | POA: Diagnosis not present

## 2023-12-01 DIAGNOSIS — R230 Cyanosis: Secondary | ICD-10-CM | POA: Diagnosis not present

## 2023-12-01 DIAGNOSIS — I6782 Cerebral ischemia: Secondary | ICD-10-CM | POA: Diagnosis not present

## 2023-12-01 DIAGNOSIS — Z79899 Other long term (current) drug therapy: Secondary | ICD-10-CM | POA: Diagnosis not present

## 2023-12-01 DIAGNOSIS — I672 Cerebral atherosclerosis: Secondary | ICD-10-CM | POA: Diagnosis not present

## 2023-12-01 HISTORY — DX: Cerebral infarction, unspecified: I63.9

## 2023-12-01 LAB — COMPREHENSIVE METABOLIC PANEL
ALT: 53 U/L — ABNORMAL HIGH (ref 0–44)
AST: 42 U/L — ABNORMAL HIGH (ref 15–41)
Albumin: 4.1 g/dL (ref 3.5–5.0)
Alkaline Phosphatase: 91 U/L (ref 38–126)
Anion gap: 11 (ref 5–15)
BUN: 19 mg/dL (ref 8–23)
CO2: 25 mmol/L (ref 22–32)
Calcium: 9.4 mg/dL (ref 8.9–10.3)
Chloride: 104 mmol/L (ref 98–111)
Creatinine, Ser: 0.67 mg/dL (ref 0.44–1.00)
GFR, Estimated: >60 mL/min (ref >60)
Glucose, Bld: 118 mg/dL — ABNORMAL HIGH (ref 70–99)
Potassium: 4 mmol/L (ref 3.5–5.1)
Sodium: 140 mmol/L (ref 135–145)
Total Bilirubin: 0.6 mg/dL (ref 0.0–1.2)
Total Protein: 7.3 g/dL (ref 6.5–8.1)

## 2023-12-01 LAB — URINALYSIS, ROUTINE W REFLEX MICROSCOPIC
Bilirubin Urine: NEGATIVE
Glucose, UA: NEGATIVE mg/dL
Hgb urine dipstick: NEGATIVE
Ketones, ur: NEGATIVE mg/dL
Leukocytes,Ua: NEGATIVE
Nitrite: NEGATIVE
Protein, ur: NEGATIVE mg/dL
Specific Gravity, Urine: 1.02 (ref 1.005–1.030)
pH: 5 (ref 5.0–8.0)

## 2023-12-01 LAB — CBC
HCT: 40.5 % (ref 36.0–46.0)
Hemoglobin: 13 g/dL (ref 12.0–15.0)
MCH: 31.7 pg (ref 26.0–34.0)
MCHC: 32.1 g/dL (ref 30.0–36.0)
MCV: 98.8 fL (ref 80.0–100.0)
Platelets: 326 10*3/uL (ref 150–400)
RBC: 4.1 MIL/uL (ref 3.87–5.11)
RDW: 12.8 % (ref 11.5–15.5)
WBC: 12.8 10*3/uL — ABNORMAL HIGH (ref 4.0–10.5)
nRBC: 0 % (ref 0.0–0.2)

## 2023-12-01 LAB — DIFFERENTIAL
Abs Immature Granulocytes: 0.04 10*3/uL (ref 0.00–0.07)
Basophils Absolute: 0.1 10*3/uL (ref 0.0–0.1)
Basophils Relative: 1 %
Eosinophils Absolute: 0.4 10*3/uL (ref 0.0–0.5)
Eosinophils Relative: 3 %
Immature Granulocytes: 0 %
Lymphocytes Relative: 11 %
Lymphs Abs: 1.3 10*3/uL (ref 0.7–4.0)
Monocytes Absolute: 0.9 10*3/uL (ref 0.1–1.0)
Monocytes Relative: 7 %
Neutro Abs: 10.1 10*3/uL — ABNORMAL HIGH (ref 1.7–7.7)
Neutrophils Relative %: 78 %

## 2023-12-01 LAB — APTT: aPTT: 27 s (ref 24–36)

## 2023-12-01 LAB — GLUCOSE, CAPILLARY
Glucose-Capillary: 114 mg/dL — ABNORMAL HIGH (ref 70–99)
Glucose-Capillary: 150 mg/dL — ABNORMAL HIGH (ref 70–99)

## 2023-12-01 LAB — RAPID URINE DRUG SCREEN, HOSP PERFORMED
Amphetamines: NOT DETECTED
Barbiturates: NOT DETECTED
Benzodiazepines: NOT DETECTED
Cocaine: NOT DETECTED
Opiates: NOT DETECTED
Tetrahydrocannabinol: NOT DETECTED

## 2023-12-01 LAB — PROTIME-INR
INR: 0.9 (ref 0.8–1.2)
Prothrombin Time: 12.5 s (ref 11.4–15.2)

## 2023-12-01 LAB — PLATELET INHIBITION P2Y12: Platelet Function  P2Y12: 176 [PRU] — ABNORMAL LOW (ref 182–335)

## 2023-12-01 MED ORDER — ATORVASTATIN CALCIUM 80 MG PO TABS
80.0000 mg | ORAL_TABLET | Freq: Every day | ORAL | Status: DC
  Administered 2023-12-01 – 2023-12-02 (×2): 80 mg via ORAL
  Filled 2023-12-01: qty 2
  Filled 2023-12-01: qty 1

## 2023-12-01 MED ORDER — IOHEXOL 350 MG/ML SOLN
100.0000 mL | Freq: Once | INTRAVENOUS | Status: AC | PRN
  Administered 2023-12-01: 100 mL via INTRAVENOUS

## 2023-12-01 MED ORDER — DIAZEPAM 5 MG/ML IJ SOLN: 2.5000 mg | Freq: Four times a day (QID) | INTRAMUSCULAR | Status: DC | PRN

## 2023-12-01 MED ORDER — FLUTICASONE FUROATE-VILANTEROL 200-25 MCG/ACT IN AEPB
1.0000 | INHALATION_SPRAY | Freq: Every day | RESPIRATORY_TRACT | Status: DC
  Administered 2023-12-01: 1 via RESPIRATORY_TRACT
  Filled 2023-12-01: qty 28

## 2023-12-01 MED ORDER — ASPIRIN 81 MG PO CHEW
81.0000 mg | CHEWABLE_TABLET | Freq: Every day | ORAL | Status: DC
Start: 2023-12-01 — End: 2023-12-02
  Administered 2023-12-01 – 2023-12-02 (×2): 81 mg via ORAL
  Filled 2023-12-01 (×2): qty 1

## 2023-12-01 MED ORDER — CLOPIDOGREL BISULFATE 75 MG PO TABS
75.0000 mg | ORAL_TABLET | Freq: Every day | ORAL | Status: DC
  Administered 2023-12-01: 75 mg via ORAL
  Filled 2023-12-01: qty 1

## 2023-12-01 MED ORDER — ACETAMINOPHEN 325 MG PO TABS
650.0000 mg | ORAL_TABLET | Freq: Once | ORAL | Status: AC
Start: 2023-12-01 — End: 2023-12-01
  Administered 2023-12-01: 650 mg via ORAL
  Filled 2023-12-01: qty 2

## 2023-12-01 MED ORDER — LORAZEPAM 2 MG/ML IJ SOLN
1.0000 mg | Freq: Once | INTRAMUSCULAR | Status: AC
  Administered 2023-12-01: 1 mg via INTRAVENOUS
  Filled 2023-12-01: qty 1

## 2023-12-01 MED ORDER — UMECLIDINIUM BROMIDE 62.5 MCG/ACT IN AEPB
1.0000 | INHALATION_SPRAY | Freq: Every day | RESPIRATORY_TRACT | Status: DC
  Filled 2023-12-01: qty 7

## 2023-12-01 MED ORDER — SODIUM CHLORIDE 0.9 % IV SOLN
INTRAVENOUS | Status: AC
Start: 2023-12-01 — End: 2023-12-01

## 2023-12-01 MED ORDER — ACETAMINOPHEN 325 MG PO TABS: 650.0000 mg | ORAL_TABLET | ORAL | Status: DC | PRN

## 2023-12-01 MED ORDER — LORAZEPAM 0.5 MG PO TABS
0.5000 mg | ORAL_TABLET | Freq: Three times a day (TID) | ORAL | Status: DC | PRN
  Administered 2023-12-01 (×2): 0.5 mg via ORAL
  Filled 2023-12-01 (×2): qty 1

## 2023-12-01 MED ORDER — TICAGRELOR 90 MG PO TABS
90.0000 mg | ORAL_TABLET | Freq: Two times a day (BID) | ORAL | Status: DC
  Administered 2023-12-01 – 2023-12-02 (×2): 90 mg via ORAL
  Filled 2023-12-01 (×2): qty 1

## 2023-12-01 MED ORDER — STROKE: EARLY STAGES OF RECOVERY BOOK
Freq: Once | Status: AC
  Filled 2023-12-01: qty 1

## 2023-12-01 MED ORDER — GABAPENTIN 100 MG PO CAPS
100.0000 mg | ORAL_CAPSULE | Freq: Every day | ORAL | Status: DC
  Administered 2023-12-01: 100 mg via ORAL
  Filled 2023-12-01: qty 1

## 2023-12-01 MED ORDER — ACETAMINOPHEN 650 MG RE SUPP: 650.0000 mg | RECTAL | Status: DC | PRN

## 2023-12-01 MED ORDER — SODIUM CHLORIDE (PF) 0.9 % IJ SOLN
INTRAMUSCULAR | Status: AC
  Filled 2023-12-01: qty 100

## 2023-12-01 MED ORDER — SENNA 8.6 MG PO TABS
100.0000 mg | ORAL_TABLET | Freq: Every day | ORAL | Status: DC
  Filled 2023-12-01: qty 12

## 2023-12-01 MED ORDER — ONDANSETRON HCL 4 MG/2ML IJ SOLN: 4.0000 mg | Freq: Once | INTRAMUSCULAR | Status: DC

## 2023-12-01 MED ORDER — SERTRALINE HCL 100 MG PO TABS
100.0000 mg | ORAL_TABLET | Freq: Every day | ORAL | Status: DC
  Administered 2023-12-01 – 2023-12-02 (×2): 100 mg via ORAL
  Filled 2023-12-01: qty 2
  Filled 2023-12-01: qty 1

## 2023-12-01 MED ORDER — ENOXAPARIN SODIUM 40 MG/0.4ML IJ SOSY
40.0000 mg | PREFILLED_SYRINGE | INTRAMUSCULAR | Status: DC
  Administered 2023-12-01 – 2023-12-02 (×2): 40 mg via SUBCUTANEOUS
  Filled 2023-12-01 (×2): qty 0.4

## 2023-12-01 MED ORDER — ACETAMINOPHEN 160 MG/5ML PO SOLN: 650.0000 mg | ORAL | Status: DC | PRN

## 2023-12-01 MED ORDER — ALBUTEROL SULFATE (2.5 MG/3ML) 0.083% IN NEBU
3.0000 mL | INHALATION_SOLUTION | Freq: Four times a day (QID) | RESPIRATORY_TRACT | Status: DC | PRN
  Administered 2023-12-01: 3 mL via RESPIRATORY_TRACT
  Filled 2023-12-01: qty 3

## 2023-12-01 MED ORDER — FAMOTIDINE 20 MG PO TABS
40.0000 mg | ORAL_TABLET | Freq: Every day | ORAL | Status: DC
Start: 2023-12-01 — End: 2023-12-02
  Administered 2023-12-01 – 2023-12-02 (×2): 40 mg via ORAL
  Filled 2023-12-01 (×2): qty 2

## 2023-12-01 MED ORDER — MECLIZINE HCL 25 MG PO TABS: 25.0000 mg | ORAL_TABLET | Freq: Three times a day (TID) | ORAL | Status: DC | PRN

## 2023-12-01 MED ORDER — LEVOTHYROXINE SODIUM 100 MCG PO TABS
125.0000 ug | ORAL_TABLET | Freq: Every day | ORAL | Status: DC
  Administered 2023-12-01 – 2023-12-02 (×2): 125 ug via ORAL
  Filled 2023-12-01 (×2): qty 1

## 2023-12-01 MED ORDER — DIAZEPAM 2 MG PO TABS
2.0000 mg | ORAL_TABLET | Freq: Three times a day (TID) | ORAL | Status: DC | PRN
  Administered 2023-12-01: 2 mg via ORAL
  Filled 2023-12-01: qty 1

## 2023-12-01 MED ORDER — BUDESON-GLYCOPYRROL-FORMOTEROL 160-9-4.8 MCG/ACT IN AERO: 2.0000 | INHALATION_SPRAY | Freq: Two times a day (BID) | RESPIRATORY_TRACT | Status: DC

## 2023-12-01 NOTE — ED Notes (Signed)
Reported code stroke to emergency line.

## 2023-12-01 NOTE — Progress Notes (Addendum)
 Code stroke timeline LKW 0000 MRS 2 0246 Code stroke cart activation. EDP assessment and CT completed prior to cart activation.  9749 Neuro [facility] paged [phone] Neuro [facility]SM[facility] on camera, NCCT results relayed to TSM[facility] during report. 9687 Neuro T[facility] off camera, no TNK, code stroke CTA/Perfusion ordered. Primary RN notified to take pt to CT asap.  0318 TSRN called CT dept, stated pt needs larger IV. TSRN notified primary RN.  [phone] larger IV obtained, TSRN called CT dept. Tech stated they are en route to get pt at this time.  0340 Pt left for CTA/Perfusion. TSRN off camera and monitoring imaging  -Jullie Arps Telestroke RN

## 2023-12-01 NOTE — Progress Notes (Signed)
 PT Cancellation Note  Patient Details Name: Stefanie Carter MRN: [id number] DOB: 1946-10-13   Cancelled Treatment:    Reason Eval/Treat Not Completed: Patient not medically ready Plans for transfer to [facility], only in ED x 5 hours at this time, further tests  pending. Will check back another time.  Darice Potters PT Acute Rehabilitation Services Office [phone] Weekend pager-[phone]   . Potters Darice Norris 12/01/2023, 7:32 AM

## 2023-12-01 NOTE — H&P (Signed)
 History and Physical    Stefanie Carter FMW:[id number] DOB: [birth year] DOA: 12/01/2023  PCP: Ransom Other, MD   Patient coming from: Home  Chief Complaint: Dizzy, N/V, leaning to left   HPI: Stefanie Carter is a 78 y.o. female with medical history significant for hypertension, hyperlipidemia, type 2 diabetes mellitus, anxiety, and recent admission for cerebellar stroke who now presents with dizziness, nausea, and vomiting.  Patient reports that she had been doing well since her recent hospitalization.  She had continued to experience some headaches but had essentially returned to baseline when she developed acute onset room spinning sensation with nausea around midnight.  Symptoms were severe and she is unable to ambulate due to this.  Symptoms are better if she remains still with her eyes closed.  Overall, this feels the same as when she presented on 11/07/2023.  She had been admitted on 11/07/2023 with dizziness, ataxia, nausea, and vomiting.  She was found to have left cerebellar CVA and was discharged on DAPT and Lipitor .  ED Course: Upon arrival to the ED, patient is found to be afebrile and saturating well on room air with normal heart rate and stable blood pressure.  Labs are most notable for AST 42, ALT 53, normal renal function, WBC 12,800, normal hemoglobin, and negative UDS.  Head CT reveals 8 mm hypodensity involving the superior left cerebellum.  No emergent large vessel occlusion is seen on CTA head and neck.   Patient remains very ataxic, neurology (Dr. Voncile) was consulted by the ED physician, and hospitalists were asked to admit.   She passed a swallow screen in the ED.   Review of Systems:  All other systems reviewed and apart from HPI, are negative.  Past Medical History:  Diagnosis Date   Anxiety    Diabetes (HCC)    HLD (hyperlipidemia)    HTN (hypertension)    Hypothyroid    Spinal stenosis     Past Surgical History:  Procedure Laterality Date    APPENDECTOMY     SALIVARY GLAND SURGERY     TONSILLECTOMY      Social History:   reports that she quit smoking about 12 years ago. Her smoking use included cigarettes. She started smoking about 42 years ago. She has a 30 pack-year smoking history. She has never used smokeless tobacco. She reports current alcohol use. She reports that she does not use drugs.  Allergies  Allergen Reactions   Penicillins Anaphylaxis   Metformin Hcl Other (See Comments)   Sulfa Antibiotics     Had as a baby; doesn't know reaction.   Sulfamethoxazole-Trimethoprim Other (See Comments)    Other reaction(s): Unknown   Varenicline Other (See Comments)    Other reaction(s): intol   Codeine Nausea And Vomiting    Family History  Problem Relation Age of Onset   Heart disease Mother    COPD Mother    Kidney disease Father    Depression Father    Glaucoma Father      Prior to Admission medications   Medication Sig Start Date End Date Taking? Authorizing Provider  acetaminophen  (TYLENOL ) 325 MG tablet Take 325 mg by mouth every 6 (six) hours as needed for moderate pain (pain score 4-6).    [provider]  albuterol  (ACCUNEB ) 1.25 MG/3ML nebulizer solution Take 1 ampule by nebulization every 6 (six) hours as needed for wheezing or shortness of breath. 08/18/23   [provider]  aspirin  81 MG chewable tablet Chew 1 tablet (81 mg total) by  mouth daily. 11/10/23   Hongalgi, Anand D, MD  atorvastatin  (LIPITOR ) 80 MG tablet Take 1 tablet (80 mg total) by mouth daily. 11/10/23   Hongalgi, Anand D, MD  Budeson-Glycopyrrol-Formoterol  (BREZTRI AEROSPHERE) 160-9-4.8 MCG/ACT AERO Inhale 2 puffs into the lungs in the morning and at bedtime.    [provider]  caffeine 200 MG TABS tablet Take 200 mg by mouth daily as needed.    [provider]  clopidogrel  (PLAVIX ) 75 MG tablet Take 1 tablet (75 mg total) by mouth daily. 11/10/23 02/06/24  Hongalgi, Anand D, MD  Coenzyme Q10 (CO Q10) 100  MG CAPS Take by mouth.    [provider]  famotidine  (PEPCID ) 40 MG tablet Take 1 tablet (40 mg total) by mouth daily. 11/09/23   Hongalgi, Anand D, MD  ibuprofen (ADVIL) 200 MG tablet Take 200 mg by mouth every 6 (six) hours as needed for mild pain (pain score 1-3) or moderate pain (pain score 4-6).    [provider]  ipratropium-albuterol  (DUONEB) 0.5-2.5 (3) MG/3ML SOLN Take 3 mLs by nebulization every 6 (six) hours as needed.    [provider]  levothyroxine  (SYNTHROID ) 125 MCG tablet Take 1 tablet (125 mcg total) by mouth daily before breakfast. 11/09/23   Hongalgi, Anand D, MD  loratadine (CLARITIN) 10 MG tablet Take 10 mg by mouth daily.    [provider]  LORazepam  (ATIVAN ) 0.5 MG tablet Take 0.5 mg by mouth every 8 (eight) hours as needed. 04/09/23   [provider]  Magnesium  200 MG TABS Take 100 mg by mouth daily.    [provider]  meclizine  (ANTIVERT ) 25 MG tablet Take 1 tablet (25 mg total) by mouth 3 (three) times daily as needed for dizziness. 11/09/23   Hongalgi, Anand D, MD  MULTIPLE VITAMIN PO Take by mouth.    [provider]  Omega-3 Fatty Acids (FISH OIL PO) Take 1 capsule by mouth daily.    [provider]  pseudoephedrine (SUDAFED) 30 MG tablet Take 30 mg by mouth every 6 (six) hours as needed for congestion.    [provider]  senna (SENOKOT) 8.6 MG tablet Take 100 mg by mouth daily.    [provider]  sertraline  (ZOLOFT ) 100 MG tablet Take 100 mg by mouth daily. 06/11/21   [provider]  VITAMIN D, CHOLECALCIFEROL, PO Take by mouth.    [provider]  vitamin E 200 UNIT capsule Take 200 Units by mouth daily.    [provider]    Physical Exam: Vitals:   12/01/23 9786 12/01/23 0248 12/01/23 0411  BP:  (!) 148/74   Pulse:  76   Resp:  18   Temp:  98.2 F (36.8 C)   TempSrc:  Oral   SpO2:  94%   Weight: 54.3 kg  54 kg  Height:   5' (1.524 m)      Constitutional: NAD, no diaphoresis   Eyes: PERTLA, lids and conjunctivae normal ENMT: Mucous membranes are moist. Posterior pharynx clear of any exudate or lesions.   Neck: supple, no masses  Respiratory: no wheezing, no crackles. No accessory muscle use.  Cardiovascular: S1 & S2 heard, regular rate and rhythm. No extremity edema.   Abdomen: No distension, no tenderness, soft. Bowel sounds active.  Musculoskeletal: no clubbing / cyanosis. No joint deformity upper and lower extremities.   Skin: no significant rashes, lesions, ulcers. Warm, dry, well-perfused. Neurologic: CN 2-12 grossly intact. Sensation to light touch diminished in  lower left face. Strength 5/5 in UEs. Strength testing in LEs is limited by low back pain. Alert and oriented.  Psychiatric: Pleasant. Cooperative.    Labs and Imaging on Admission: I have personally reviewed following labs and imaging studies  CBC: Recent Labs  Lab 12/01/23 0220  WBC 12.8*  NEUTROABS 10.1*  HGB 13.0  HCT 40.5  MCV 98.8  PLT 326   Basic Metabolic Panel: Recent Labs  Lab 12/01/23 0220  NA 140  K 4.0  CL 104  CO2 25  GLUCOSE 118*  BUN 19  CREATININE 0.67  CALCIUM  9.4   GFR: Estimated Creatinine Clearance: 43 mL/min (by C-G formula based on SCr of 0.67 mg/dL). Liver Function Tests: Recent Labs  Lab 12/01/23 0220  AST 42*  ALT 53*  ALKPHOS 91  BILITOT 0.6  PROT 7.3  ALBUMIN 4.1   No results for input(s): LIPASE, AMYLASE in the last 168 hours. No results for input(s): AMMONIA in the last 168 hours. Coagulation Profile: Recent Labs  Lab 12/01/23 0220  INR 0.9   Cardiac Enzymes: No results for input(s): CKTOTAL, CKMB, CKMBINDEX, TROPONINI in the last 168 hours. BNP (last 3 results) No results for input(s): PROBNP in the last 8760 hours. HbA1C: No results for input(s): HGBA1C in the last 72 hours. CBG: Recent Labs  Lab 12/01/23 0219  GLUCAP 114*   Lipid Profile: No results for  input(s): CHOL, HDL, LDLCALC, TRIG, CHOLHDL, LDLDIRECT in the last 72 hours. Thyroid  Function Tests: No results for input(s): TSH, T4TOTAL, FREET4, T3FREE, THYROIDAB in the last 72 hours. Anemia Panel: No results for input(s): VITAMINB12, FOLATE, FERRITIN, TIBC, IRON, RETICCTPCT in the last 72 hours. Urine analysis:    Component Value Date/Time   COLORURINE YELLOW 12/01/2023 0253   APPEARANCEUR CLEAR 12/01/2023 0253   LABSPEC 1.020 12/01/2023 0253   PHURINE 5.0 12/01/2023 0253   GLUCOSEU NEGATIVE 12/01/2023 0253   HGBUR NEGATIVE 12/01/2023 0253   BILIRUBINUR NEGATIVE 12/01/2023 0253   BILIRUBINUR negative 12/07/2020 1632   KETONESUR NEGATIVE 12/01/2023 0253   PROTEINUR NEGATIVE 12/01/2023 0253   UROBILINOGEN 0.2 12/07/2020 1632   NITRITE NEGATIVE 12/01/2023 0253   LEUKOCYTESUR NEGATIVE 12/01/2023 0253   Sepsis Labs: @LABRCNTIP (procalcitonin:4,lacticidven:4) )No results found for this or any previous visit (from the past 240 hours).   Radiological Exams on Admission: CT ANGIO HEAD NECK W WO CM W PERF (CODE STROKE) Result Date: 12/01/2023 CLINICAL DATA:  78 year old female code stroke. Distal left vertebral artery occlusion and left cerebellar infarcts last month. EXAM: CT ANGIOGRAPHY HEAD AND NECK TECHNIQUE: Multidetector CT imaging of the head and neck was performed using the standard protocol during bolus administration of intravenous contrast. Multiplanar CT image reconstructions and MIPs were obtained to evaluate the vascular anatomy. Carotid stenosis measurements (when applicable) are obtained utilizing NASCET criteria, using the distal internal carotid diameter as the denominator. RADIATION DOSE REDUCTION: This exam was performed according to the departmental dose-optimization program which includes automated exposure control, adjustment of the mA and/or kV according to patient size and/or use of iterative reconstruction technique. CONTRAST:   OMNIPAQUE  IOHEXOL  350 MG/ML SOLN COMPARISON:  Plain head CT 0222 hours today. Brain MRI 11/07/2023. CTA head and neck 11/07/2023. FINDINGS: CTA NECK Skeleton: Highly abnormal C1-C2 redemonstrated, stable from last month and similar to cervical spine MRI 08/18/2023 where bulky pannus was noted about the odontoid. Superimposed advanced lower cervical spine degeneration and degenerative C3-C4 spondylolisthesis also appears stable. No acute osseous abnormality identified. Upper chest: Stable upper lungs with subpleural reticular  scarring. No superior mediastinal lymphadenopathy. Other neck: Stable. Aortic arch: Calcified aortic atherosclerosis.  3 vessel arch. Right carotid system: Brachiocephalic artery and proximal right ICA plaque with no significant stenosis, stable. Left carotid system: Stable left CCA plaque and tortuosity without stenosis. More complex soft and calcified plaque at the left ICA origin and bulb appears stable with less than 50 % stenosis with respect to the distal vessel. Vertebral arteries: Stable right subclavian artery origin calcification. Stable right vertebral artery origin calcified plaque with no significant stenosis. Right vertebral artery is tortuous throughout the neck and remains patent to the skull base with no significant stenosis. Bulky plaque throughout the proximal left subclavian artery redemonstrated and the vessel remains patent. Left vertebral artery origin relatively spared and normal. Left vertebral artery is tortuous in the neck and remains patent to the skull base now, with improved left V3 segment enhancement compared to last month. CTA HEAD Posterior circulation: Distal left vertebral artery now is patent to the vertebrobasilar junction. Left PICA origin is also faintly enhancing. Fairly codominant appearance of the distal vertebral arteries now. Stable right V4 segment patency, patent right PICA origin. Basilar artery is patent with mild irregularity, no significant  stenosis. Patent SCA origins. Fetal type bilateral PCA origins redemonstrated. Bilateral PCA branches are patent and within normal limits. Anterior circulation: Both ICA siphons are patent. Mild left siphon calcified plaque with no significant stenosis. Mild to moderate right siphon calcified plaque with only mild stenosis. Normal posterior communicating artery origins. Patent carotid termini, MCA and ACA origins appear stable. Anterior communicating artery and median artery of the corpus callosum redemonstrated. Visible ACA branches are patent and within normal limits. Left MCA M1 segment and bifurcation are patent without stenosis. Left MCA branches are stable and within normal limits. Right MCA M1 segment and trifurcation are patent without stenosis. Right MCA branches are stable and within normal limits. Venous sinuses: Patent. Anatomic variants: Fetal type bilateral PCA origins. Median artery of the corpus callosum. Review of the MIP images confirms the above findings IMPRESSION: 1. Negative for large vessel occlusion and recanalization of the Distal Left Vertebral Artery since 11/07/2023. 2. Stable superimposed atherosclerosis in the head and neck. Plaque throughout the proximal left subclavian artery, but no high-grade atherosclerotic stenosis is identified. Aortic Atherosclerosis (ICD10-I70.0). 3. Chronic severe cervical spine degeneration, chronic Pannus about the odontoid process. Electronically Signed   By: VEAR Hurst M.D.   On: 12/01/2023 04:24   CT HEAD CODE STROKE WO CONTRAST Result Date: 12/01/2023 CLINICAL DATA:  Code stroke. Initial evaluation for acute neuro deficit, stroke. EXAM: CT HEAD WITHOUT CONTRAST TECHNIQUE: Contiguous axial images were obtained from the base of the skull through the vertex without intravenous contrast. RADIATION DOSE REDUCTION: This exam was performed according to the departmental dose-optimization program which includes automated exposure control, adjustment of the mA  and/or kV according to patient size and/or use of iterative reconstruction technique. COMPARISON:  Prior study from 11/07/2023 FINDINGS: Brain: Cerebral volume within normal limits. Mild chronic microvascular ischemic disease noted. Previously identified left cerebellar infarcts not well visualized by CT. 8 mm hypodensity involving the superior left cerebellum is seen (series 4, image 228), new from prior, and could reflect an acute to subacute ischemic infarct. No other acute cortically based infarct. No acute intracranial hemorrhage. No mass lesion or midline shift. No hydrocephalus or extra-axial fluid collection. Vascular: No abnormal hyperdense vessel. Skull: Scalp soft tissues demonstrate no acute finding. Calvarium intact. Sinuses/Orbits: Globes and orbital soft tissues within normal limits.  Paranasal sinuses are largely clear. No significant mastoid effusion. Other: None. ASPECTS ([place]ta Stroke Program Early CT Score) - Ganglionic level infarction (caudate, lentiform nuclei, internal capsule, insula, M1-M3 cortex): 7 - Supraganglionic infarction (M4-M6 cortex): 3 Total score (0-10 with 10 being normal): 10 IMPRESSION: Sign report she 1. 8 mm hypodensity involving the superior left cerebellum, new from prior, and could reflect an acute to subacute ischemic infarct. No acute intracranial hemorrhage. 2. Aspects equals 10. 3. Underlying mild chronic microvascular ischemic disease. Results were called by telephone at the time of interpretation on 12/01/2023 at 2:43 am to provider DAVID [facility] , who verbally acknowledged these results. Electronically Signed   By: Morene Hoard M.D.   On: 12/01/2023 02:44    Assessment/Plan   1. Ataxia; cerebellar stroke   - Check MRI brain, consult PT/OT/SLP, continue DAPT and Lipitor     2. Hypertension  - Permit hypertension for now pending MRI    3. Type II DM  - A1c was 6.5% in January 2025  - Check CBGs and use low-intensity SSI for now    4. COPD  - Not  in exacerbation   - Continue ICS-LAMA-LABA and as-needed albuterol    5. Hyperlipidemia  - Lipitor     6. Anxiety  - Zoloft , as-needed Ativan     7. Hypothyroidism  - Synthroid      DVT prophylaxis: Lovenox   Code Status: Full  Level of Care: Level of care: Telemetry Medical Family Communication: None present  Disposition Plan:  Patient is from: home  Anticipated d/c is to: TBD Anticipated d/c date is: 2/5 or 12/03/23  Patient currently: Pending MRI brain, therapy assessments, neurology consultation  Consults called: Neurology  Admission status: Observation     Evalene GORMAN Sprinkles, MD [facility]  12/01/2023, 4:54 AM

## 2023-12-01 NOTE — Plan of Care (Signed)
 Pt still in transit to [facility]. Discussed with Dr. Royal earlier today, pt does have some stroke extension but minimal and subacute, likely all from the left subclavian stenosis/atherosclerosis. Need to rule out hypotension at home. I have ordered some fluid for a short period. Her P2Y12 4 hours after plavix  seems only minimally decreased, will change to brilinta  and repeat P2Y12 in am. Continue statin and ASA. Will see her in am.   Ary Cummins, MD PhD Stroke Neurology 12/01/2023 7:48 PM

## 2023-12-01 NOTE — ED Notes (Signed)
Went over for MRI.

## 2023-12-01 NOTE — Progress Notes (Signed)
1610 Dr. Stephannie Peters (neuro [facility]) notified of CTA/Perfusion imaging results.   -[place] [facility]roke RN

## 2023-12-01 NOTE — Plan of Care (Signed)

## 2023-12-01 NOTE — Plan of Care (Signed)
 On-call neurology note  Notified by Dr. Raford that the patient was seen as an acute code stroke by telestroke. Question small area of left cerebellar infarct that is new from the prior. No ELVO. Patient still remains very ataxic.  Recommend MRI for further evaluation. No need to repeat the stroke workup as one was just completed 2 weeks ago. Continue dual antiplatelets. Will be admitted to the hospitalist as she is still very ataxic in her gait.  Stroke team follow-up on arrival to [facility].  Eligio Lav, MD

## 2023-12-01 NOTE — ED Provider Notes (Signed)
 [place] EMERGENCY DEPARTMENT AT [facility] Provider Note   CSN: [id number] Arrival date & time: 12/01/23  0147     History  Chief Complaint  Patient presents with   Emesis   Weakness    Stefanie Carter is a 78 y.o. female.  The history is provided by the patient and the EMS personnel.  Emesis Weakness Associated symptoms: vomiting   She has history of hypertension, diabetes, hyperlipidemia, stroke and was brought in by ambulance because of dizziness and nausea.  Last known normal was midnight.  Symptoms are similar to what she had with stroke 3 weeks ago.  She denies any headache.  EMS noted left-sided weakness.   Home Medications Prior to Admission medications   Medication Sig Start Date End Date Taking? Authorizing Provider  acetaminophen  (TYLENOL ) 325 MG tablet Take 325 mg by mouth every 6 (six) hours as needed for moderate pain (pain score 4-6).    [provider]  albuterol  (ACCUNEB ) 1.25 MG/3ML nebulizer solution Take 1 ampule by nebulization every 6 (six) hours as needed for wheezing or shortness of breath. 08/18/23   [provider]  aspirin  81 MG chewable tablet Chew 1 tablet (81 mg total) by mouth daily. 11/10/23   Hongalgi, Anand D, MD  atorvastatin  (LIPITOR ) 80 MG tablet Take 1 tablet (80 mg total) by mouth daily. 11/10/23   Hongalgi, Anand D, MD  Budeson-Glycopyrrol-Formoterol  (BREZTRI AEROSPHERE) 160-9-4.8 MCG/ACT AERO Inhale 2 puffs into the lungs in the morning and at bedtime.    [provider]  caffeine 200 MG TABS tablet Take 200 mg by mouth daily as needed.    [provider]  clopidogrel  (PLAVIX ) 75 MG tablet Take 1 tablet (75 mg total) by mouth daily. 11/10/23 02/06/24  Hongalgi, Anand D, MD  Coenzyme Q10 (CO Q10) 100 MG CAPS Take by mouth.    [provider]  famotidine  (PEPCID ) 40 MG tablet Take 1 tablet (40 mg total) by mouth daily. 11/09/23   Hongalgi, Anand D, MD  ibuprofen (ADVIL) 200 MG tablet  Take 200 mg by mouth every 6 (six) hours as needed for mild pain (pain score 1-3) or moderate pain (pain score 4-6).    [provider]  ipratropium-albuterol  (DUONEB) 0.5-2.5 (3) MG/3ML SOLN Take 3 mLs by nebulization every 6 (six) hours as needed.    [provider]  levothyroxine  (SYNTHROID ) 125 MCG tablet Take 1 tablet (125 mcg total) by mouth daily before breakfast. 11/09/23   Hongalgi, Anand D, MD  loratadine (CLARITIN) 10 MG tablet Take 10 mg by mouth daily.    [provider]  LORazepam  (ATIVAN ) 0.5 MG tablet Take 0.5 mg by mouth every 8 (eight) hours as needed. 04/09/23   [provider]  Magnesium  200 MG TABS Take 100 mg by mouth daily.    [provider]  meclizine  (ANTIVERT ) 25 MG tablet Take 1 tablet (25 mg total) by mouth 3 (three) times daily as needed for dizziness. 11/09/23   Hongalgi, Anand D, MD  MULTIPLE VITAMIN PO Take by mouth.    [provider]  Omega-3 Fatty Acids (FISH OIL PO) Take 1 capsule by mouth daily.    [provider]  pseudoephedrine (SUDAFED) 30 MG tablet Take 30 mg by mouth every 6 (six) hours as needed for congestion.    [provider]  senna (SENOKOT) 8.6 MG tablet Take 100 mg by mouth daily.    [provider]  sertraline  (ZOLOFT ) 100 MG tablet Take 100 mg  by mouth daily. 06/11/21   [provider]  VITAMIN D, CHOLECALCIFEROL, PO Take by mouth.    [provider]  vitamin E 200 UNIT capsule Take 200 Units by mouth daily.    [provider]      Allergies    Penicillins, Metformin hcl, Sulfa antibiotics, Sulfamethoxazole-trimethoprim, Varenicline, and Codeine    Review of Systems   Review of Systems  Gastrointestinal:  Positive for vomiting.  Neurological:  Positive for weakness.  All other systems reviewed and are negative.   Physical Exam Updated Vital Signs BP (!) 148/74 (BP Location: Right Arm)   Pulse 76   Temp 98.2 F (36.8 C) (Oral)    Resp 18   Wt 54.3 kg   SpO2 94%   BMI 23.38 kg/m  Physical Exam Vitals and nursing note reviewed.   78 year old female, resting comfortably and in no acute distress. Vital signs are sitting up again for mildly elevated systolic blood pressure. Oxygen saturation is 94%, which is normal. Head is normocephalic and atraumatic. PERRLA, she has a gaze preference for the right and pupils only minimally past the midline toward the left. Oropharynx is clear. Neck is nontender and supple without adenopathy or JVD.  There are no carotid bruits. Lungs have coarse expiratory wheezes without rales or rhonchi. Chest is nontender. Heart has regular rate and rhythm without murmur. Abdomen is soft, flat, nontender. Extremities have no cyanosis or edema, full range of motion is present. Skin is warm and dry without rash. Neurologic: Awake and alert, oriented x 3.  Speech is normal.  There is no facial asymmetry.  Visual fields are narrowed to mainly central vision but no focal field cut noted.  Strength is 5/5 in all 4 extremities, but she has incoordination of the left arm and leg.  There is moderate ataxia on finger-nose testing with the left arm but not on the right.  Station and gait are not tested.  ED Results / Procedures / Treatments   Labs (all labs ordered are listed, but only abnormal results are displayed) Labs Reviewed  ETHANOL  PROTIME-INR  APTT  CBC  DIFFERENTIAL  COMPREHENSIVE METABOLIC PANEL  RAPID URINE DRUG SCREEN, HOSP PERFORMED  URINALYSIS, ROUTINE W REFLEX MICROSCOPIC  I-STAT CHEM 8, ED    EKG None  Radiology CT ANGIO HEAD NECK W WO CM W PERF (CODE STROKE) Result Date: 12/01/2023 CLINICAL DATA:  78 year old female code stroke. Distal left vertebral artery occlusion and left cerebellar infarcts last month. EXAM: CT ANGIOGRAPHY HEAD AND NECK TECHNIQUE: Multidetector CT imaging of the head and neck was performed using the standard protocol during bolus administration of  intravenous contrast. Multiplanar CT image reconstructions and MIPs were obtained to evaluate the vascular anatomy. Carotid stenosis measurements (when applicable) are obtained utilizing NASCET criteria, using the distal internal carotid diameter as the denominator. RADIATION DOSE REDUCTION: This exam was performed according to the departmental dose-optimization program which includes automated exposure control, adjustment of the mA and/or kV according to patient size and/or use of iterative reconstruction technique. CONTRAST:  OMNIPAQUE  IOHEXOL  350 MG/ML SOLN COMPARISON:  Plain head CT 0222 hours today. Brain MRI 11/07/2023. CTA head and neck 11/07/2023. FINDINGS: CTA NECK Skeleton: Highly abnormal C1-C2 redemonstrated, stable from last month and similar to cervical spine MRI 08/18/2023 where bulky pannus was noted about the odontoid. Superimposed advanced lower cervical spine degeneration and degenerative C3-C4 spondylolisthesis also appears stable. No acute osseous abnormality identified. Upper chest: Stable upper lungs with subpleural reticular  scarring. No superior mediastinal lymphadenopathy. Other neck: Stable. Aortic arch: Calcified aortic atherosclerosis.  3 vessel arch. Right carotid system: Brachiocephalic artery and proximal right ICA plaque with no significant stenosis, stable. Left carotid system: Stable left CCA plaque and tortuosity without stenosis. More complex soft and calcified plaque at the left ICA origin and bulb appears stable with less than 50 % stenosis with respect to the distal vessel. Vertebral arteries: Stable right subclavian artery origin calcification. Stable right vertebral artery origin calcified plaque with no significant stenosis. Right vertebral artery is tortuous throughout the neck and remains patent to the skull base with no significant stenosis. Bulky plaque throughout the proximal left subclavian artery redemonstrated and the vessel remains patent. Left vertebral  artery origin relatively spared and normal. Left vertebral artery is tortuous in the neck and remains patent to the skull base now, with improved left V3 segment enhancement compared to last month. CTA HEAD Posterior circulation: Distal left vertebral artery now is patent to the vertebrobasilar junction. Left PICA origin is also faintly enhancing. Fairly codominant appearance of the distal vertebral arteries now. Stable right V4 segment patency, patent right PICA origin. Basilar artery is patent with mild irregularity, no significant stenosis. Patent SCA origins. Fetal type bilateral PCA origins redemonstrated. Bilateral PCA branches are patent and within normal limits. Anterior circulation: Both ICA siphons are patent. Mild left siphon calcified plaque with no significant stenosis. Mild to moderate right siphon calcified plaque with only mild stenosis. Normal posterior communicating artery origins. Patent carotid termini, MCA and ACA origins appear stable. Anterior communicating artery and median artery of the corpus callosum redemonstrated. Visible ACA branches are patent and within normal limits. Left MCA M1 segment and bifurcation are patent without stenosis. Left MCA branches are stable and within normal limits. Right MCA M1 segment and trifurcation are patent without stenosis. Right MCA branches are stable and within normal limits. Venous sinuses: Patent. Anatomic variants: Fetal type bilateral PCA origins. Median artery of the corpus callosum. Review of the MIP images confirms the above findings IMPRESSION: 1. Negative for large vessel occlusion and recanalization of the Distal Left Vertebral Artery since 11/07/2023. 2. Stable superimposed atherosclerosis in the head and neck. Plaque throughout the proximal left subclavian artery, but no high-grade atherosclerotic stenosis is identified. Aortic Atherosclerosis (ICD10-I70.0). 3. Chronic severe cervical spine degeneration, chronic Pannus about the odontoid  process. Electronically Signed   By: VEAR Hurst M.D.   On: 12/01/2023 04:24   CT HEAD CODE STROKE WO CONTRAST Result Date: 12/01/2023 CLINICAL DATA:  Code stroke. Initial evaluation for acute neuro deficit, stroke. EXAM: CT HEAD WITHOUT CONTRAST TECHNIQUE: Contiguous axial images were obtained from the base of the skull through the vertex without intravenous contrast. RADIATION DOSE REDUCTION: This exam was performed according to the departmental dose-optimization program which includes automated exposure control, adjustment of the mA and/or kV according to patient size and/or use of iterative reconstruction technique. COMPARISON:  Prior study from 11/07/2023 FINDINGS: Brain: Cerebral volume within normal limits. Mild chronic microvascular ischemic disease noted. Previously identified left cerebellar infarcts not well visualized by CT. 8 mm hypodensity involving the superior left cerebellum is seen (series 4, image 228), new from prior, and could reflect an acute to subacute ischemic infarct. No other acute cortically based infarct. No acute intracranial hemorrhage. No mass lesion or midline shift. No hydrocephalus or extra-axial fluid collection. Vascular: No abnormal hyperdense vessel. Skull: Scalp soft tissues demonstrate no acute finding. Calvarium intact. Sinuses/Orbits: Globes and orbital soft tissues within normal limits.  Paranasal sinuses are largely clear. No significant mastoid effusion. Other: None. ASPECTS (Alberta Stroke Program Early CT Score) - Ganglionic level infarction (caudate, lentiform nuclei, internal capsule, insula, M1-M3 cortex): 7 - Supraganglionic infarction (M4-M6 cortex): 3 Total score (0-10 with 10 being normal): 10 IMPRESSION: Sign report she 1. 8 mm hypodensity involving the superior left cerebellum, new from prior, and could reflect an acute to subacute ischemic infarct. No acute intracranial hemorrhage. 2. Aspects equals 10. 3. Underlying mild chronic microvascular ischemic disease.  Results were called by telephone at the time of interpretation on 12/01/2023 at 2:43 am to provider [facility] , who verbally acknowledged these results. Electronically Signed   By: Morene Hoard M.D.   On: 12/01/2023 02:44    Procedures Procedures  Cardiac monitor shows sinus rhythm with occasional PVC, per my interpretation.  Medications Ordered in ED Medications - No data to display  ED Course/ Medical Decision Making/ A&P                                 Medical Decision Making Amount and/or Complexity of Data Reviewed Labs: ordered. Radiology: ordered.  Risk OTC drugs. Prescription drug management. Decision regarding hospitalization.   Dizziness with physical findings concerning for recurrent stroke.  I have reviewed her past records, and she was admitted 11/07/2023-11/09/2023 for left cerebellar stroke.  Last neurologic exam documented on 11/09/2023 states mild ataxia on the left.  Exam today seems to demonstrate greater ataxia.  Gaze preference is also new.  Because of new neurologic findings, I have activated code stroke.  CT shows acute to subacute stroke in the left cerebellum.  I have independently viewed the images, and agree with the radiologist's interpretation.  I have also discussed the findings with the radiologist.  I reviewed her laboratory test, and my interpretation is mild elevation of transaminases which is not felt to be clinically significant, mild leukocytosis which is nonspecific.  CT angiogram shows no large vessel occlusion, also recanalization of distal left vertebral artery since prior CT angiogram.  I have independently viewed the images, and agree with radiologist's interpretation.  I have ordered MRI to evaluate for new stroke.  I have discussed case with Dr. Charlton of [facility], who agrees to admit the patient.  CRITICAL CARE Performed by: Alm Lias Total critical care time: 55 minutes Critical care time was exclusive of separately  billable procedures and treating other patients. Critical care was necessary to treat or prevent imminent or life-threatening deterioration. Critical care was time spent personally by me on the following activities: development of treatment plan with patient and/or surrogate as well as nursing, discussions with consultants, evaluation of patient's response to treatment, examination of patient, obtaining history from patient or surrogate, ordering and performing treatments and interventions, ordering and review of laboratory studies, ordering and review of radiographic studies, pulse oximetry and re-evaluation of patient's condition.  Final Clinical Impression(s) / ED Diagnoses Final diagnoses:  Cerebrovascular accident (CVA), unspecified mechanism (HCC)  Elevated transaminase level  Elevated random blood glucose level    Rx / DC Orders ED Discharge Orders     None         Lias Alm, MD 12/01/23 [id number]

## 2023-12-01 NOTE — ED Notes (Signed)
[facility] transport coordinated, pending arrival.

## 2023-12-01 NOTE — ED Triage Notes (Addendum)
 Pt c/o vomiting, weakness and nauseous x 2 hours. Pt vomited 4 times PTA, pt with hx of stroke, last stroke 11/07/23. Pt - for EMS for stroke, pt with left side lean. RN assessed pt , and called MD to triage, MD Raford to assess pt. 0214 sTROKE CALLED  Pt has meds for vertigo that she has taken today per EMS . EMS gave 4 mg zofran  IV

## 2023-12-01 NOTE — Progress Notes (Signed)
 78 year old female Former tobacco COPD DM TY 2 HTN HLD Recent admission 1/11-1/13/2025 with vertigo and inability to walk straight extreme nausea-last known normal 11/05/2023-imaging = mid and lower cerebellar infarcts-discharged on aspirin  Plavix  X 3 months no driving at this time she had a purulent right toe wound additionally and completed 5 days doxycycline   2/4 emergency room eval vomiting X4--- code stroke called again-CT shows acute to subacute stroke in left cerebellum with more ataxia and gaze preference CT angiogram no large vessel occlusion MRI pending Because of further severe ataxia patient to complete further neurowork-up at [facility]   O/e BP (!) 102/41   Pulse 77   Temp 98.1 F (36.7 C)   Resp 18   Ht 5' (1.524 m)   Wt 54 kg   SpO2 90%   BMI 23.24 kg/m  Awake slightly confused does follow commands seems to be picking at her arms an legs Some intermittent wheeze She is quite distractable S 1s 2no m Past pointing is off on both sides, vision by direct confrontation not examined Power is 5/5 bilaterally Reflexes are equivocal  Patient will transfer to [facility] for further workup with neurology service Watch patient mental status etc. going forward   Reggie Grimes, MD [facility] 7:53 AM

## 2023-12-01 NOTE — ED Notes (Signed)
Pt placed on 2 liters [place] due to falling 02 when sleeping

## 2023-12-01 NOTE — Plan of Care (Signed)
  Problem: Education: Goal: Knowledge of disease or condition will improve 12/01/2023 1256 by Rennie Laneta BROCKS, RN Outcome: Progressing 12/01/2023 1255 by Rennie Laneta BROCKS, RN Outcome: Progressing Goal: Knowledge of secondary prevention will improve (MUST DOCUMENT ALL) 12/01/2023 1256 by Rennie Laneta BROCKS, RN Outcome: Progressing 12/01/2023 1255 by Rennie Laneta BROCKS, RN Outcome: Progressing Goal: Knowledge of patient specific risk factors will improve (DELETE if not current risk factor) 12/01/2023 1256 by Rennie Laneta BROCKS, RN Outcome: Progressing 12/01/2023 1255 by Rennie Laneta BROCKS, RN Outcome: Progressing   Problem: Ischemic Stroke/TIA Tissue Perfusion: Goal: Complications of ischemic stroke/TIA will be minimized 12/01/2023 1256 by Rennie Laneta BROCKS, RN Outcome: Progressing 12/01/2023 1255 by Rennie Laneta BROCKS, RN Outcome: Progressing   Problem: Coping: Goal: Will verbalize positive feelings about self 12/01/2023 1256 by Rennie Laneta BROCKS, RN Outcome: Progressing 12/01/2023 1255 by Rennie Laneta BROCKS, RN Outcome: Progressing Goal: Will identify appropriate support needs 12/01/2023 1256 by Rennie Laneta BROCKS, RN Outcome: Progressing 12/01/2023 1255 by Rennie Laneta BROCKS, RN Outcome: Progressing   Problem: Health Behavior/Discharge Planning: Goal: Ability to manage health-related needs will improve 12/01/2023 1256 by Rennie Laneta BROCKS, RN Outcome: Progressing 12/01/2023 1255 by Rennie Laneta BROCKS, RN Outcome: Progressing Goal: Goals will be collaboratively established with patient/family 12/01/2023 1256 by Rennie Laneta BROCKS, RN Outcome: Progressing 12/01/2023 1255 by Rennie Laneta BROCKS, RN Outcome: Progressing   Problem: Self-Care: Goal: Ability to participate in self-care as condition permits will improve 12/01/2023 1256 by Rennie Laneta BROCKS, RN Outcome: Progressing 12/01/2023 1255 by Rennie Laneta BROCKS, RN Outcome: Progressing Goal: Verbalization of feelings and concerns over difficulty with  self-care will improve 12/01/2023 1256 by Rennie Laneta BROCKS, RN Outcome: Progressing 12/01/2023 1255 by Rennie Laneta BROCKS, RN Outcome: Progressing Goal: Ability to communicate needs accurately will improve 12/01/2023 1256 by Rennie Laneta BROCKS, RN Outcome: Progressing 12/01/2023 1255 by Rennie Laneta BROCKS, RN Outcome: Progressing   Problem: Nutrition: Goal: Risk of aspiration will decrease 12/01/2023 1256 by Rennie Laneta BROCKS, RN Outcome: Progressing 12/01/2023 1255 by Rennie Laneta BROCKS, RN Outcome: Progressing Goal: Dietary intake will improve 12/01/2023 1256 by Rennie Laneta BROCKS, RN Outcome: Progressing 12/01/2023 1255 by Rennie Laneta BROCKS, RN Outcome: Progressing

## 2023-12-01 NOTE — Consult Note (Addendum)
 [facility] Tele[facility]urology Consult Services  Date of Birth:   05-17-1946 Date of Service:   12/01/2023 02:50:42  Diagnosis:       I63.89 - Cerebrovascular accident (CVA) due to other mechanism [facility])  Impression:      This is a 78 year old woman with history of stroke, hypertension, and diabetes, here with dizziness and nausea starting at about midnight, stroke alert called for the same. She is not a candidate for IV thrombolytics given that she had a stroke in January (stroke within the past 3 months). 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 I would like to get a repeat CTA to make sure there is no basilar blockage  Our recommendations are outlined below.  Recommendations:       CTA head and neck ordered- pend    ------------------------------------------------------------------------------  Advanced Imaging: Advanced imaging has been ordered. Results pending.   Metrics: Last Known Well: 12/01/2023 00:00:00 Dispatch Time: 12/01/2023 02:50:42 Arrival Time: 12/01/2023 01:47:00 Initial Response Time: 12/01/2023 02:55:09 Symptoms: dizziness. Initial patient interaction: 12/01/2023 02:56:00 NIHSS Assessment Completed: 12/01/2023 03:06:00 Patient is not a candidate for Thrombolytic. Thrombolytic Medical Decision: 12/01/2023 03:06:00 Patient was not deemed candidate for Thrombolytic because of following reasons: Significant head trauma or stroke in previous 3 months .  hypodensity left cerebellum  Primary Provider Notified of Diagnostic Impression and Management Plan on: 12/01/2023 03:36:01    ------------------------------------------------------------------------------  History of Present Illness: Patient is a 78 year old Female.  Patient was brought by EMS for symptoms of dizziness. This is a 78 year old woman with history of stroke, hypertension, and diabetes, here with dizziness and nausea  starting at about midnight. She was awake at the time, doing her nails. She was noted to have difficulty with coordination when LUE finger-nose-finger was tested- she was unsure whether this was new. The patient presented to the hospital with dizziness 11/07/2023- she was found to have a left cerebellar stroke and left vertebral artery occlusion. There was some suspicion for dissection. MRI brain showed areas of infarction in the left cerebellum and left dorsal medulla. The patient's symptoms reportedly improved and she was discharged   Past Medical History:      Hypertension      Diabetes Mellitus      Hyperlipidemia      Stroke Other PMH:  history of stroke, hypertension, and diabetes, hyperlipidemia, spinal stenosis COPD  Medications:  No Anticoagulant use  Antiplatelet use: Yes aspirin  Plavix  Reviewed EMR for current medications  Allergies:  Reviewed  Social History: Drug Use: No  Family History:  There is no family history of premature cerebrovascular disease pertinent to this consultation  ROS : 14 Points Review of Systems was performed and was negative except mentioned in HPI.  Past Surgical History: There Is No Surgical History Contributory To Today's Visit    Examination: BP(148/74), Pulse(76), 1A: Level of Consciousness - Alert; keenly responsive + 0 1B: Ask Month and Age - Both Questions Right + 0 1C: Blink Eyes & Squeeze Hands - Performs Both Tasks + 0 2: Test Horizontal Extraocular Movements - Normal + 0 3: Test Visual Fields - Partial Hemianopia + 1 4: Test Facial Palsy (Use Grimace if Obtunded) - Normal symmetry + 0 5A: Test Left Arm Motor Drift - No Drift for 10 Seconds + 0 5B: Test Right Arm Motor Drift - No Drift for 10 Seconds + 0 6A: Test Left Leg Motor Drift - Drift, but doesn't hit  bed + 1 6B: Test Right Leg Motor Drift - No Drift for 5 Seconds + 0 7: Test Limb Ataxia (FNF/Heel-Shin) - Ataxia in 1 Limb + 1 8: Test Sensation - Mild-Moderate Loss:  Less Sharp/More Dull + 1 9: Test Language/Aphasia - Normal; No aphasia + 0 10: Test Dysarthria - Mild-Moderate Dysarthria: Slurring but can be understood + 1 11: Test Extinction/Inattention - No abnormality + 0  NIHSS Score: 5  NIHSS Free Text : minimal LLE drift- exam limited by pain, dysmetria LUE finger nose finger, decreased peripheral vision upper field bilaterally, dysarthric  Pre-Morbid Modified Rankin Scale: 1 Points = No significant disability despite symptoms; able to carry out all usual duties and activities  Spoke with : Dr Raford  This consult was conducted in real time using interactive audio and immunologist. Patient was informed of the technology being used for this visit and agreed to proceed. Patient located in hospital and provider located at home/office setting.   Patient is being evaluated for possible acute neurologic impairment and high probability of imminent or life-threatening deterioration. I spent total of 51 minutes providing care to this patient, including time for face to face visit via telemedicine, review of medical records, imaging studies and discussion of findings with providers, the patient and/or family.   Dr Burnard Muskrat   [facility] For Inpatient follow-up with [facility] physician please call [facility] at [phone]. As we are not an outpatient service for any post hospital discharge needs please contact the hospital for assistance. If you have any questions for the [facility] physicians or need to reconsult for clinical or diagnostic changes please contact us  via [facility] at [phone].    Advanced Imaging:  CTA Head and Neck Completed.  LVO:No   Addendum- L vertebral artery has recanalized- there was a small focus of ? infarct that is new- within the left cerebellum- better characterized with MR brain Continue dual antiplatelet, permissive hypertension, PT/OT/Speech

## 2023-12-02 ENCOUNTER — Telehealth (HOSPITAL_COMMUNITY): Payer: Self-pay | Admitting: Pharmacy Technician

## 2023-12-02 ENCOUNTER — Other Ambulatory Visit (HOSPITAL_COMMUNITY): Payer: Self-pay

## 2023-12-02 DIAGNOSIS — I639 Cerebral infarction, unspecified: Secondary | ICD-10-CM | POA: Diagnosis not present

## 2023-12-02 LAB — COMPREHENSIVE METABOLIC PANEL
ALT: 39 U/L (ref 0–44)
AST: 28 U/L (ref 15–41)
Albumin: 3.2 g/dL — ABNORMAL LOW (ref 3.5–5.0)
Alkaline Phosphatase: 75 U/L (ref 38–126)
Anion gap: 11 (ref 5–15)
BUN: 13 mg/dL (ref 8–23)
CO2: 25 mmol/L (ref 22–32)
Calcium: 8.9 mg/dL (ref 8.9–10.3)
Chloride: 104 mmol/L (ref 98–111)
Creatinine, Ser: 0.72 mg/dL (ref 0.44–1.00)
GFR, Estimated: >60 mL/min (ref >60)
Glucose, Bld: 107 mg/dL — ABNORMAL HIGH (ref 70–99)
Potassium: 3.5 mmol/L (ref 3.5–5.1)
Sodium: 140 mmol/L (ref 135–145)
Total Bilirubin: 0.6 mg/dL (ref 0.0–1.2)
Total Protein: 5.8 g/dL — ABNORMAL LOW (ref 6.5–8.1)

## 2023-12-02 LAB — CBC WITH DIFFERENTIAL/PLATELET
Abs Immature Granulocytes: 0.03 10*3/uL (ref 0.00–0.07)
Basophils Absolute: 0.1 10*3/uL (ref 0.0–0.1)
Basophils Relative: 1 %
Eosinophils Absolute: 0.2 10*3/uL (ref 0.0–0.5)
Eosinophils Relative: 3 %
HCT: 37.6 % (ref 36.0–46.0)
Hemoglobin: 12.6 g/dL (ref 12.0–15.0)
Immature Granulocytes: 0 %
Lymphocytes Relative: 17 %
Lymphs Abs: 1.3 10*3/uL (ref 0.7–4.0)
MCH: 31.3 pg (ref 26.0–34.0)
MCHC: 33.5 g/dL (ref 30.0–36.0)
MCV: 93.5 fL (ref 80.0–100.0)
Monocytes Absolute: 0.8 10*3/uL (ref 0.1–1.0)
Monocytes Relative: 10 %
Neutro Abs: 5.5 10*3/uL (ref 1.7–7.7)
Neutrophils Relative %: 69 %
Platelets: 287 10*3/uL (ref 150–400)
RBC: 4.02 MIL/uL (ref 3.87–5.11)
RDW: 12.6 % (ref 11.5–15.5)
WBC: 7.9 10*3/uL (ref 4.0–10.5)
nRBC: 0 % (ref 0.0–0.2)

## 2023-12-02 LAB — GLUCOSE, CAPILLARY: Glucose-Capillary: 116 mg/dL — ABNORMAL HIGH (ref 70–99)

## 2023-12-02 LAB — TSH: TSH: 1.415 u[IU]/mL (ref 0.350–4.500)

## 2023-12-02 LAB — T4, FREE: Free T4: 0.98 ng/dL (ref 0.61–1.12)

## 2023-12-02 LAB — PLATELET INHIBITION P2Y12: Platelet Function  P2Y12: 28 [PRU] — ABNORMAL LOW (ref 182–335)

## 2023-12-02 MED ORDER — TICAGRELOR 90 MG PO TABS: 90.0000 mg | ORAL_TABLET | Freq: Two times a day (BID) | ORAL | 0 refills | Status: DC

## 2023-12-02 MED ORDER — CLOPIDOGREL BISULFATE 75 MG PO TABS
75.0000 mg | ORAL_TABLET | Freq: Every day | ORAL | 0 refills | Status: DC
  Filled 2023-12-02: qty 60, 60d supply, fill #0

## 2023-12-02 MED ORDER — CLOPIDOGREL BISULFATE 75 MG PO TABS: 75.0000 mg | ORAL_TABLET | Freq: Every day | ORAL | 0 refills | Status: DC

## 2023-12-02 MED ORDER — ASPIRIN 325 MG PO TBEC
325.0000 mg | DELAYED_RELEASE_TABLET | Freq: Every day | ORAL | 0 refills | Status: DC
  Filled 2023-12-02: qty 90, 90d supply, fill #0

## 2023-12-02 MED ORDER — SENNA 8.6 MG PO TABS
1.0000 | ORAL_TABLET | Freq: Every day | ORAL | Status: DC
Start: 2023-12-02 — End: 2023-12-02
  Administered 2023-12-02: 8.6 mg via ORAL

## 2023-12-02 MED ORDER — CLOPIDOGREL BISULFATE 75 MG PO TABS
75.0000 mg | ORAL_TABLET | Freq: Every day | ORAL | 0 refills | Status: AC
  Filled 2023-12-02: qty 90, 90d supply, fill #0

## 2023-12-02 MED ORDER — CHLORHEXIDINE GLUCONATE CLOTH 2 % EX PADS
6.0000 | MEDICATED_PAD | Freq: Every day | CUTANEOUS | Status: DC
  Administered 2023-12-02: 6 via TOPICAL

## 2023-12-02 MED ORDER — TICAGRELOR 90 MG PO TABS
90.0000 mg | ORAL_TABLET | Freq: Two times a day (BID) | ORAL | 0 refills | Status: DC
  Filled 2023-12-02: qty 60, 30d supply, fill #0

## 2023-12-02 NOTE — Progress Notes (Signed)
 Patient having frequent bradycardiac episodes dropping into the 30's but will quickly rebound back from anywhere from 60-100's. Dr. Lee notified. EKG performed and on epic. Pt denies any symptoms, resting comfortably during these events. Will continue to monitor.

## 2023-12-02 NOTE — Evaluation (Signed)
 Speech Language Pathology Evaluation Patient Details Name: Tanaka Gillen MRN: [id number] DOB: 07-08-1946 Today's Date: 12/02/2023 Time: 8975-8955 SLP Time Calculation (min) (ACUTE ONLY): 20 min  Problem List:  Patient Active Problem List   Diagnosis Date Noted   Cerebellar stroke (HCC) 12/01/2023   Vertigo 11/08/2023   Cellulitis of right lower extremity 11/08/2023   DM2 (diabetes mellitus, type 2) (HCC) 11/08/2023   GAD (generalized anxiety disorder) 11/08/2023   Acquired hypothyroidism 11/08/2023   History of COPD 11/08/2023   Acute ischemic stroke (HCC) 11/07/2023   Spinal stenosis 06/20/2013   HLD (hyperlipidemia) 06/20/2013   Essential hypertension 06/20/2013   Anxiety 06/20/2013   Past Medical History:  Past Medical History:  Diagnosis Date   Anxiety    Diabetes (HCC)    HLD (hyperlipidemia)    HTN (hypertension)    Hypothyroid    Spinal stenosis    Past Surgical History:  Past Surgical History:  Procedure Laterality Date   APPENDECTOMY     SALIVARY GLAND SURGERY     TONSILLECTOMY     HPI:  Bernie Fobes is a 78 y.o. female with medical history significant for hypertension, hyperlipidemia, type 2 diabetes mellitus, anxiety, and recent admission for cerebellar stroke who now presents with dizziness, nausea, and vomiting. MRI extension of the left cerebellar infarct since 11/07/2023 to  involve the superior cerebellar artery territory. But no hemorrhagic transformation or mass effect, and otherwise regressed ischemic cerebellar signal abnormality since last month. SLE 11/08/2023 not presenting with suspected change in cognition, language or speech. SLP questioned ability to care for herself at home. Did not follow on acute but may benefit from more in depth evaluation at next venue of care.   Assessment / Plan / Recommendation Clinical Impression  Pt does not appear significantly different from speech-language-cognitive assessment performed last evaluation  where she was not picked up for treatment. Suspect her verbal responses may be greater than functional ability if there is increased stimulation or excess information presented. She responded within average range for subtests of the Cognistat including orientation, memory, verbal attention, comprehension (followed 3 step command), repetition, and verbal problem solving. Pt stated she has a system for her medication and gave her pill box away (encouraged her to purchase another pill box). She pays her bills with autopay, check or cash. Speech is intelligible and language functional. ST is not recommended in acute at present however she may benefit from home health ST to further assess function at home if experiences difficulty as she lives alone.     SLP Assessment  SLP Recommendation/Assessment: Patient does not need any further Speech Lanaguage Pathology Services SLP Visit Diagnosis: Cognitive communication deficit (R41.841)    Recommendations for follow up therapy are one component of a multi-disciplinary discharge planning process, led by the attending physician.  Recommendations may be updated based on patient status, additional functional criteria and insurance authorization.    Follow Up Recommendations  Home health SLP (if needed)    Assistance Recommended at Discharge  None  Functional Status Assessment Patient has not had a recent decline in their functional status  Frequency and Duration           SLP Evaluation Cognition  Overall Cognitive Status: Within Functional Limits for tasks assessed Arousal/Alertness: Awake/alert Orientation Level: Oriented to person;Oriented to place;Oriented to time;Oriented to situation (oriented to situation although doctor had not talked to her about results of MRI) Year: 2025 Month: February Day of Week: Correct Attention: Sustained Sustained Attention: Appears intact (may have  difficulty with higher level attention and excess  information) Memory: Appears intact Awareness: Appears intact Problem Solving: Appears intact Safety/Judgment: Other (comment) (questionable)       Comprehension  Auditory Comprehension Overall Auditory Comprehension: Appears within functional limits for tasks assessed Commands: Within Functional Limits (followed 3 step commands) Visual Recognition/Discrimination Discrimination: Not tested Reading Comprehension Reading Status: Not tested    Expression Expression Primary Mode of Expression: Verbal Verbal Expression Overall Verbal Expression: Appears within functional limits for tasks assessed Initiation: No impairment Level of Generative/Spontaneous Verbalization: Conversation Repetition: No impairment Naming: No impairment Pragmatics: No impairment Written Expression Written Expression: Not tested   Oral / Motor  Oral Motor/Sensory Function Overall Oral Motor/Sensory Function: Within functional limits Motor Speech Overall Motor Speech: Appears within functional limits for tasks assessed Respiration: Within functional limits Phonation: Normal Resonance: Within functional limits Articulation: Within functional limitis Intelligibility: Intelligible Motor Planning: Witnin functional limits Motor Speech Errors: Not applicable            Dustin Olam Bull 12/02/2023, 11:19 AM

## 2023-12-02 NOTE — Care Management Obs Status (Signed)
 MEDICARE OBSERVATION STATUS NOTIFICATION   Patient Details  Name: Stefanie Carter MRN: [id number] Date of Birth: Apr 08, 1946   Medicare Observation Status Notification Given:  Yes    Omie Bickers, RN 12/02/2023, 10:01 AM

## 2023-12-02 NOTE — Evaluation (Signed)
 Physical Therapy Evaluation Patient Details Name: Stefanie Carter MRN: [id number] DOB: [birth year] Today's Date: 12/02/2023  History of Present Illness  78 y.o. female who presents 12/01/23 with dizziness, nausea, and vomiting. MRI Extension of the left cerebellar infarct (no hemorrhage)  PMH significant for hypertension, hyperlipidemia, type 2 diabetes mellitus, anxiety, and recent admission 11/07/23 for cerebellar stroke  Clinical Impression   Pt admitted secondary to problem above with deficits below. PTA patient was living alone and walking with cane. She reports a neighbor accompanied her up her steps on discharge before and plans to do the same. She remains impulsive (as on previous admission) with decr safety with RW, however not very receptive to attempts to correct her. May do better with cane for this reason.  Pt ambulated 200 ft with RW and supervision and up/down 2 steps with left rail with supervision. Anticipate patient will benefit from PT to address problems listed below. Will continue to follow acutely to maximize functional mobility independence and safety.  Patient would like to continue HHPT upon discharge (she was already receiving services after previous CVA).           If plan is discharge home, recommend the following: A little help with walking and/or transfers;Assist for transportation;Supervision due to cognitive status   Can travel by private vehicle        Equipment Recommendations None recommended by PT  Recommendations for Other Services       Functional Status Assessment Patient has had a recent decline in their functional status and demonstrates the ability to make significant improvements in function in a reasonable and predictable amount of time.     Precautions / Restrictions Precautions Precautions: Fall Restrictions Weight Bearing Restrictions Per Provider Order: No      Mobility  Bed Mobility Overal bed mobility: Independent                   Transfers Overall transfer level: Needs assistance Equipment used: Rolling walker (2 wheels) Transfers: Sit to/from Stand Sit to Stand: Contact guard assist           General transfer comment: vc for proper hand placement and to fully turn/back up to seat while holding RW and then to reach back    Ambulation/Gait Ambulation/Gait assistance: Contact guard assist Gait Distance (Feet): 200 Feet Assistive device: Rolling walker (2 wheels), Straight cane Gait Pattern/deviations: Step-through pattern   Gait velocity interpretation: >2.62 ft/sec, indicative of community ambulatory   General Gait Details: Patient at times lifing RW to go over an obstacle instead of turning to go sideways through narrow places. No LOB throughout  Stairs Stairs: Yes Stairs assistance: Supervision Stair Management: Alternating pattern, One rail Left, Forwards Number of Stairs: 2    Wheelchair Mobility     Tilt Bed    Modified Rankin (Stroke Patients Only) Modified Rankin (Stroke Patients Only) Pre-Morbid Rankin Score: Slight disability Modified Rankin: Moderately severe disability     Balance Overall balance assessment: Mild deficits observed, not formally tested                                           Pertinent Vitals/Pain Pain Assessment Pain Assessment: Faces Faces Pain Scale: No hurt    Home Living Family/patient expects to be discharged to:: Private residence Living Arrangements: Alone Available Help at Discharge: Neighbor Type of Home: House Home Access: Stairs to enter Entrance  Stairs-Rails: Left Entrance Stairs-Number of Steps: 3-4   Home Layout: One level Home Equipment: Agricultural Consultant (2 wheels);Cane - single point;Educational Psychologist (4 wheels) Additional Comments: has a pet bird    Prior Function Prior Level of Function : Independent/Modified Independent             Mobility Comments: modified independent using cane; working with  HHPT and HHOT ADLs Comments: indep ADL/IADLs     Extremity/Trunk Assessment   Upper Extremity Assessment Upper Extremity Assessment: Defer to OT evaluation    Lower Extremity Assessment Lower Extremity Assessment: Generalized weakness    Cervical / Trunk Assessment Cervical / Trunk Assessment: Other exceptions Cervical / Trunk Exceptions: chronic stenosis with pain into L buttock and thigh  Communication   Communication Communication: No apparent difficulties  Cognition Arousal: Alert Behavior During Therapy: Impulsive Overall Cognitive Status: No family/caregiver present to determine baseline cognitive functioning                                 General Comments: Basic orientation is [facility]. verbose, distractable, needs near constant re-direction, poor command follow        General Comments      Exercises     Assessment/Plan    PT Assessment Patient needs continued PT services  PT Problem List Decreased balance;Decreased safety awareness;Decreased knowledge of use of DME;Decreased cognition;Decreased strength;Decreased mobility       PT Treatment Interventions DME instruction;Gait training;Stair training;Functional mobility training;Therapeutic activities;Balance training;Cognitive remediation;Patient/family education    PT Goals (Current goals can be found in the Care Plan section)  Acute Rehab PT Goals Patient Stated Goal: go home today PT Goal Formulation: With patient Time For Goal Achievement: 12/16/23 Potential to Achieve Goals: Fair    Frequency Min 1X/week     Co-evaluation               AM-PAC PT 6 Clicks Mobility  Outcome Measure Help needed turning from your back to your side while in a flat bed without using bedrails?: None Help needed moving from lying on your back to sitting on the side of a flat bed without using bedrails?: None Help needed moving to and from a bed to a chair (including a wheelchair)?: A Little Help  needed standing up from a chair using your arms (e.g., wheelchair or bedside chair)?: A Little Help needed to walk in hospital room?: A Little Help needed climbing 3-5 steps with a railing? : None 6 Click Score: 21    End of Session Equipment Utilized During Treatment: Gait belt Activity Tolerance: Patient tolerated treatment well Patient left: with call bell/phone within reach;in chair;with chair alarm set Nurse Communication: Mobility status;Other (comment) (impulsive) PT Visit Diagnosis: Unsteadiness on feet (R26.81);Other abnormalities of gait and mobility (R26.89);Dizziness and giddiness (R42);Other symptoms and signs involving the nervous system (R29.898)    Time: 9061-9040 PT Time Calculation (min) (ACUTE ONLY): 21 min   Charges:   PT Evaluation $PT Eval Low Complexity: 1 Low   PT General Charges $$ ACUTE PT VISIT: 1 Visit          Macario RAMAN, PT Acute Rehabilitation Services  Office [phone]   Macario SHAUNNA Soja 12/02/2023, 10:17 AM

## 2023-12-02 NOTE — Progress Notes (Signed)
 Lm for ride janet 5:30 5:45 and 6pm

## 2023-12-02 NOTE — Plan of Care (Signed)

## 2023-12-02 NOTE — Plan of Care (Signed)
 Pt alert x4, o2-2L, foley , plan home health

## 2023-12-02 NOTE — Progress Notes (Signed)
 PROGRESS NOTE    Stefanie Carter  FMW:[id number] DOB: 09-Mar-1946 DOA: 12/01/2023 PCP: Ransom Other, MD   Brief Narrative:  78 y.o. female with medical history significant for hypertension, hyperlipidemia, type 2 diabetes mellitus, anxiety, and recent admission for cerebellar stroke (11/07/2023-11/09/2023) presented with dizziness, nausea, vomiting.  On presentation, head CT revealed 8 mm hypodensity involving the superior left cerebellum.  No emergent large vessel occlusion seen on CTA head and neck.  Neurology was consulted.  Assessment & Plan:   Ataxia in a patient with history of recent cerebellar stroke -head CT revealed 8 mm hypodensity involving the superior left cerebellum.  No emergent large vessel occlusion seen on CTA head and neck.  -MRI of brain showed extension of recent left cerebellar infarct without hemorrhagic transformation or mass effect. -Awaiting neurology evaluation. -PT/OT/SLP evaluation -Continue DAPT and Lipitor   Hypertension Monitor blood pressure.  Allow permissive hypertension  Diabetes mellitus type 2 -Continue CBGs with SSI  COPD -Not in exacerbation.  Continue current inhaled regimen  Hyperlipidemia -Continue statin  Anxiety -Valium  and Ativan  held because of bradycardia overnight.  Continue Zoloft   Hypothyroidism -Continue levothyroxine   DVT prophylaxis: Lovenox  Code Status: Full Family Communication: None at bedside Disposition Plan: Status is: Observation The patient will require care spanning > 2 midnights and should be moved to inpatient because: Of severity of illness    Consultants: Neurology  Procedures: None  Antimicrobials: None   Subjective: Patient seen and examined at bedside.  No fever, vomiting, chest pain or shortness of breath reported.  Mild intermittent dizziness present.  Objective: Vitals:   12/01/23 1900 12/01/23 2000 12/01/23 2326 12/02/23 0445  BP: 111/85 (!) 167/83 (!) 146/62 (!) 147/71  Pulse: 71 68  69 76  Resp: (!) 25 18 20 18   Temp:  98.1 F (36.7 C) 97.9 F (36.6 C) 97.6 F (36.4 C)  TempSrc:  Oral Oral Oral  SpO2:  96% 97% 98%  Weight:      Height:        Intake/Output Summary (Last 24 hours) at 12/02/2023 0726 Last data filed at 12/02/2023 0200 Gross per 24 hour  Intake 813.56 ml  Output 3650 ml  Net -2836.44 ml   Filed Weights   12/01/23 0213 12/01/23 0411  Weight: 54.3 kg 54 kg    Examination:  General exam: Appears calm and comfortable.  On room air. Respiratory system: Bilateral decreased breath sounds at bases Cardiovascular system: S1 & S2 heard, Rate controlled Gastrointestinal system: Abdomen is nondistended, soft and nontender. Normal bowel sounds heard. Extremities: No cyanosis, clubbing, edema  Central nervous system: Alert and oriented. No focal neurological deficits. Moving extremities Skin: No rashes, lesions or ulcers Psychiatry: Flat affect.  Not agitated.    Data Reviewed: I have personally reviewed following labs and imaging studies  CBC: Recent Labs  Lab 12/01/23 0220 12/02/23 0625  WBC 12.8* 7.9  NEUTROABS 10.1* 5.5  HGB 13.0 12.6  HCT 40.5 37.6  MCV 98.8 93.5  PLT 326 287   Basic Metabolic Panel: Recent Labs  Lab 12/01/23 0220 12/02/23 0625  NA 140 140  K 4.0 3.5  CL 104 104  CO2 25 25  GLUCOSE 118* 107*  BUN 19 13  CREATININE 0.67 0.72  CALCIUM  9.4 8.9   GFR: Estimated Creatinine Clearance: 43 mL/min (by C-G formula based on SCr of 0.72 mg/dL). Liver Function Tests: Recent Labs  Lab 12/01/23 0220 12/02/23 0625  AST 42* 28  ALT 53* 39  ALKPHOS 91 75  BILITOT 0.6 0.6  PROT 7.3 5.8*  ALBUMIN 4.1 3.2*   No results for input(s): LIPASE, AMYLASE in the last 168 hours. No results for input(s): AMMONIA in the last 168 hours. Coagulation Profile: Recent Labs  Lab 12/01/23 0220  INR 0.9   Cardiac Enzymes: No results for input(s): CKTOTAL, CKMB, CKMBINDEX, TROPONINI in the last 168 hours. BNP (last  3 results) No results for input(s): PROBNP in the last 8760 hours. HbA1C: No results for input(s): HGBA1C in the last 72 hours. CBG: Recent Labs  Lab 12/01/23 0219 12/01/23 2109 12/02/23 0603  GLUCAP 114* 150* 116*   Lipid Profile: No results for input(s): CHOL, HDL, LDLCALC, TRIG, CHOLHDL, LDLDIRECT in the last 72 hours. Thyroid  Function Tests: Recent Labs    12/02/23 0625  TSH 1.415   Anemia Panel: No results for input(s): VITAMINB12, FOLATE, FERRITIN, TIBC, IRON, RETICCTPCT in the last 72 hours. Sepsis Labs: No results for input(s): PROCALCITON, LATICACIDVEN in the last 168 hours.  No results found for this or any previous visit (from the past 240 hours).       Radiology Studies: MR BRAIN WO CONTRAST Result Date: 12/01/2023 CLINICAL DATA:  77 year old female code stroke presentation. Left cerebral infarcts last month, distal left vertebral artery occlusion at that time. EXAM: MRI HEAD WITHOUT CONTRAST TECHNIQUE: Multiplanar, multiecho pulse sequences of the brain and surrounding structures were obtained without intravenous contrast. COMPARISON:  CTA head and neck this morning.  Brain MRI 11/07/2023. FINDINGS: Study is intermittently degraded by motion artifact despite repeated imaging attempts. Brain: Increased area of heterogeneous diffusion in the left cerebellar hemisphere since 11/07/2023, but at the same time fading diffusion restriction. Left SCA territory involvement is new since that time. Associated mild T2 and FLAIR hyperintensity with no hemorrhagic transformation or mass effect. No other restricted diffusion to suggest acute infarction. No midline shift, mass effect, evidence of mass lesion, ventriculomegaly, extra-axial collection or acute intracranial hemorrhage. Cervicomedullary junction and pituitary are within normal limits. Outside of the left cerebellum gray and white matter signal appears stable when allowing for motion artifact.  Vascular: Major intracranial vascular flow voids are preserved, with normalization of the distal left vertebral artery flow void compared to the previous MRI. Skull and upper cervical spine: Motion degraded, abnormal upper cervical spine better demonstrated by CT 8 this morning. Background bone marrow signal remains normal. Sinuses/Orbits: Stable. Other: Trace mastoid air cell fluid is stable. IMPRESSION: 1. Motion degraded exam. 2. Extension of the left cerebellar infarct since 11/07/2023 to involve the superior cerebellar artery territory. But no hemorrhagic transformation or mass effect, and otherwise regressed ischemic cerebellar signal abnormality since last month. 3. No other acute intracranial abnormality identified. Electronically Signed   By: VEAR Hurst M.D.   On: 12/01/2023 07:31   CT ANGIO HEAD NECK W WO CM W PERF (CODE STROKE) Result Date: 12/01/2023 CLINICAL DATA:  78 year old female code stroke. Distal left vertebral artery occlusion and left cerebellar infarcts last month. EXAM: CT ANGIOGRAPHY HEAD AND NECK TECHNIQUE: Multidetector CT imaging of the head and neck was performed using the standard protocol during bolus administration of intravenous contrast. Multiplanar CT image reconstructions and MIPs were obtained to evaluate the vascular anatomy. Carotid stenosis measurements (when applicable) are obtained utilizing NASCET criteria, using the distal internal carotid diameter as the denominator. RADIATION DOSE REDUCTION: This exam was performed according to the departmental dose-optimization program which includes automated exposure control, adjustment of the mA and/or kV according to patient size and/or use of iterative reconstruction technique. CONTRAST:  OMNIPAQUE   IOHEXOL  350 MG/ML SOLN COMPARISON:  Plain head CT 0222 hours today. Brain MRI 11/07/2023. CTA head and neck 11/07/2023. FINDINGS: CTA NECK Skeleton: Highly abnormal C1-C2 redemonstrated, stable from last month and similar to  cervical spine MRI 08/18/2023 where bulky pannus was noted about the odontoid. Superimposed advanced lower cervical spine degeneration and degenerative C3-C4 spondylolisthesis also appears stable. No acute osseous abnormality identified. Upper chest: Stable upper lungs with subpleural reticular scarring. No superior mediastinal lymphadenopathy. Other neck: Stable. Aortic arch: Calcified aortic atherosclerosis.  3 vessel arch. Right carotid system: Brachiocephalic artery and proximal right ICA plaque with no significant stenosis, stable. Left carotid system: Stable left CCA plaque and tortuosity without stenosis. More complex soft and calcified plaque at the left ICA origin and bulb appears stable with less than 50 % stenosis with respect to the distal vessel. Vertebral arteries: Stable right subclavian artery origin calcification. Stable right vertebral artery origin calcified plaque with no significant stenosis. Right vertebral artery is tortuous throughout the neck and remains patent to the skull base with no significant stenosis. Bulky plaque throughout the proximal left subclavian artery redemonstrated and the vessel remains patent. Left vertebral artery origin relatively spared and normal. Left vertebral artery is tortuous in the neck and remains patent to the skull base now, with improved left V3 segment enhancement compared to last month. CTA HEAD Posterior circulation: Distal left vertebral artery now is patent to the vertebrobasilar junction. Left PICA origin is also faintly enhancing. Fairly codominant appearance of the distal vertebral arteries now. Stable right V4 segment patency, patent right PICA origin. Basilar artery is patent with mild irregularity, no significant stenosis. Patent SCA origins. Fetal type bilateral PCA origins redemonstrated. Bilateral PCA branches are patent and within normal limits. Anterior circulation: Both ICA siphons are patent. Mild left siphon calcified plaque with no  significant stenosis. Mild to moderate right siphon calcified plaque with only mild stenosis. Normal posterior communicating artery origins. Patent carotid termini, MCA and ACA origins appear stable. Anterior communicating artery and median artery of the corpus callosum redemonstrated. Visible ACA branches are patent and within normal limits. Left MCA M1 segment and bifurcation are patent without stenosis. Left MCA branches are stable and within normal limits. Right MCA M1 segment and trifurcation are patent without stenosis. Right MCA branches are stable and within normal limits. Venous sinuses: Patent. Anatomic variants: Fetal type bilateral PCA origins. Median artery of the corpus callosum. Review of the MIP images confirms the above findings IMPRESSION: 1. Negative for large vessel occlusion and recanalization of the Distal Left Vertebral Artery since 11/07/2023. 2. Stable superimposed atherosclerosis in the head and neck. Plaque throughout the proximal left subclavian artery, but no high-grade atherosclerotic stenosis is identified. Aortic Atherosclerosis (ICD10-I70.0). 3. Chronic severe cervical spine degeneration, chronic Pannus about the odontoid process. Electronically Signed   By: VEAR Hurst M.D.   On: 12/01/2023 04:24   CT HEAD CODE STROKE WO CONTRAST Result Date: 12/01/2023 CLINICAL DATA:  Code stroke. Initial evaluation for acute neuro deficit, stroke. EXAM: CT HEAD WITHOUT CONTRAST TECHNIQUE: Contiguous axial images were obtained from the base of the skull through the vertex without intravenous contrast. RADIATION DOSE REDUCTION: This exam was performed according to the departmental dose-optimization program which includes automated exposure control, adjustment of the mA and/or kV according to patient size and/or use of iterative reconstruction technique. COMPARISON:  Prior study from 11/07/2023 FINDINGS: Brain: Cerebral volume within normal limits. Mild chronic microvascular ischemic disease noted.  Previously identified left cerebellar infarcts not well visualized  by CT. 8 mm hypodensity involving the superior left cerebellum is seen (series 4, image 228), new from prior, and could reflect an acute to subacute ischemic infarct. No other acute cortically based infarct. No acute intracranial hemorrhage. No mass lesion or midline shift. No hydrocephalus or extra-axial fluid collection. Vascular: No abnormal hyperdense vessel. Skull: Scalp soft tissues demonstrate no acute finding. Calvarium intact. Sinuses/Orbits: Globes and orbital soft tissues within normal limits. Paranasal sinuses are largely clear. No significant mastoid effusion. Other: None. ASPECTS (Alberta Stroke Program Early CT Score) - Ganglionic level infarction (caudate, lentiform nuclei, internal capsule, insula, M1-M3 cortex): 7 - Supraganglionic infarction (M4-M6 cortex): 3 Total score (0-10 with 10 being normal): 10 IMPRESSION: Sign report she 1. 8 mm hypodensity involving the superior left cerebellum, new from prior, and could reflect an acute to subacute ischemic infarct. No acute intracranial hemorrhage. 2. Aspects equals 10. 3. Underlying mild chronic microvascular ischemic disease. Results were called by telephone at the time of interpretation on 12/01/2023 at 2:43 am to provider DAVID [facility] , who verbally acknowledged these results. Electronically Signed   By: Morene Hoard M.D.   On: 12/01/2023 02:44        Scheduled Meds:   stroke: early stages of recovery book   Does not apply Once   aspirin   81 mg Oral Daily   atorvastatin   80 mg Oral Daily   Chlorhexidine  Gluconate Cloth  6 each Topical Daily   enoxaparin  (LOVENOX ) injection  40 mg Subcutaneous Q24H   famotidine   40 mg Oral Daily   fluticasone  furoate-vilanterol  1 puff Inhalation Daily   gabapentin   100 mg Oral QHS   levothyroxine   125 mcg Oral Q0600   ondansetron  (ZOFRAN ) IV  4 mg Intravenous Once   senna  98.9 mg Oral Daily   sertraline   100 mg Oral Daily    ticagrelor   90 mg Oral BID   umeclidinium bromide   1 puff Inhalation Daily   Continuous Infusions:        Sophie Mao, MD [facility] 12/02/2023, 7:26 AM

## 2023-12-02 NOTE — Evaluation (Signed)
 Occupational Therapy Evaluation Patient Details Name: Stefanie Carter MRN: [id number] DOB: [birth year] Today's Date: 12/02/2023   History of Present Illness 78 y.o. female who presents 12/01/23 with dizziness, nausea, and vomiting. MRI Extension of the left cerebellar infarct (no hemorrhage)  PMH significant for hypertension, hyperlipidemia, type 2 diabetes mellitus, anxiety, and recent admission 11/07/23 for cerebellar stroke   Clinical Impression   Prior level at home modified ind with assist from neighbors as needed.  Currently patient presents with SOB with activity, impulsive with ADLs and has complaints of pain.  Patient appears to be near baseline, I could not provoke any further dizziness with functional activity or testing.  Recommend home with home health OT and assistance from friends and neighbors as available      If plan is discharge home, recommend the following: Assist for transportation;Help with stairs or ramp for entrance;Assistance with cooking/housework    Functional Status Assessment  Patient has had a recent decline in their functional status and demonstrates the ability to make significant improvements in function in a reasonable and predictable amount of time.  Equipment Recommendations  None recommended by OT    Recommendations for Other Services none      Precautions / Restrictions Precautions Precautions: Fall Restrictions Weight Bearing Restrictions Per Provider Order: No      Mobility Bed Mobility Overal bed mobility: Independent                  Transfers Overall transfer level: Needs assistance Equipment used: Rolling walker (2 wheels) Transfers: Sit to/from Stand Sit to Stand: Contact guard assist           General transfer comment: vc for safety with mobility, slow down      Balance Overall balance assessment: Mild deficits observed, not formally tested                                         ADL either  performed or assessed with clinical judgement   ADL Overall ADL's : Needs assistance/impaired Eating/Feeding: Independent   Grooming: Wash/dry hands;Wash/dry face;Oral care;Independent   Upper Body Bathing: Sitting;Modified independent   Lower Body Bathing: Supervison/ safety;Sit to/from stand   Upper Body Dressing : Modified independent;Sitting   Lower Body Dressing: Supervision/safety;Sit to/from stand   Toilet Transfer: Supervision/safety   Toileting- Architect and Hygiene: Supervision/safety         General ADL Comments: Pt is impulsive with movement, turning and reaching quickly and resistent to re-direction for safety.  Able to reach her LE with no difficulty and sits with legs up in the recliner.     Vision Baseline Vision/History: 1 Wears glasses (all the time) Ability to See in Adequate Light: 0 Adequate Patient Visual Report: No change from baseline (Initially had double vision) Vision Assessment?: Yes;No apparent visual deficits Eye Alignment: Within Functional Limits Ocular Range of Motion: Within Functional Limits Alignment/Gaze Preference: Chin down Tracking/Visual Pursuits: Able to track stimulus in all quads without difficulty Saccades: Within functional limits Visual Fields: No apparent deficits Additional Comments: Perfomed slow VOR with no report of increased dizziness.  Complaints revolved around neck pain.  Able to read name tag and written information without difficulty     Perception         Praxis Praxis: WFL (finger to nose with slight delay L>R, able to manipulate items on her tray and manage her phone)  Pertinent Vitals/Pain Pain Assessment Faces Pain Scale: Hurts little more Pain Location: stated pain was all over including the back of her head Pain Descriptors / Indicators: Discomfort Pain Intervention(s): Repositioned (Patient is more comfortable in the chair vs the bed)     Extremity/Trunk Assessment Upper Extremity  Assessment Upper Extremity Assessment: Overall WFL for tasks assessed;Right hand dominant;Generalized weakness (Arthritic changes in hands; L shoulder greater then right)   Lower Extremity Assessment Lower Extremity Assessment: Defer to PT evaluation   Cervical / Trunk Assessment Cervical / Trunk Assessment: Kyphotic Cervical / Trunk Exceptions: limited neck range - significant head forward posture   Communication Communication Communication: No apparent difficulties   Cognition Arousal: Alert Behavior During Therapy: Impulsive Overall Cognitive Status: Within Functional Limits for tasks assessed                                 General Comments: preoccupied with surroundings and easily distracted.  Verbose and requiring cues to focus     General Comments       Exercises     Shoulder Instructions      Home Living Family/patient expects to be discharged to:: Private residence Living Arrangements: Alone Available Help at Discharge: Neighbor Type of Home: House Home Access: Stairs to enter [employer] of Steps: 3-4 Entrance Stairs-Rails: Left Home Layout: One level     Bathroom Shower/Tub: Tub/shower unit;Walk-in shower   Bathroom Toilet: Standard Bathroom Accessibility: Yes How Accessible: Accessible via walker Home Equipment: Rolling Walker (2 wheels);Cane - single point;Educational Psychologist (4 wheels)   Additional Comments: has a pet bird  Lives With: Alone    Prior Functioning/Environment Prior Level of Function : Independent/Modified Independent             Mobility Comments: modified independent using cane; working with HHPT and HHOT ADLs Comments: indep ADL/IADLs (indicated her home was full and that she tended to grab to different places for balance)        OT Problem List: Decreased activity tolerance;Impaired balance (sitting and/or standing);Decreased safety awareness;Decreased cognition;Pain;Impaired UE functional use       OT Treatment/Interventions: Self-care/ADL training;Energy conservation;Patient/family education;Balance training    OT Goals(Current goals can be found in the care plan section) Acute Rehab OT Goals Patient Stated Goal: To not have episodes like she did at home with dizziness OT Goal Formulation: With patient Time For Goal Achievement: 12/16/23 Potential to Achieve Goals: Good ADL Goals Additional ADL Goal #1: Patient will Independently verbalize understanding of energy conservation techniques to manage SOB and fatique with ADLs Additional ADL Goal #2: Patient will be able to stand x10 minutes for ADL activity with supervision Additional ADL Goal #3: Patient will demonstrate awareness of quick/impulsive movements during ADL activity for self correction with minimal verbal cues  OT Frequency: Min 1X/week    Co-evaluation              AM-PAC OT 6 Clicks Daily Activity     Outcome Measure Help from another person eating meals?: None Help from another person taking care of personal grooming?: None Help from another person toileting, which includes using toliet, bedpan, or urinal?: A Little Help from another person bathing (including washing, rinsing, drying)?: A Little Help from another person to put on and taking off regular upper body clothing?: A Little Help from another person to put on and taking off regular lower body clothing?: A Little 6 Click Score: 20  End of Session Equipment Utilized During Treatment: Gait belt;Rolling walker (2 wheels) Nurse Communication: Other (comment) (Reported recommendations and also that patient reported feeling more SOB then usual.)  Activity Tolerance: Patient limited by fatigue;Patient limited by pain Patient left: in chair;with call bell/phone within reach;with chair alarm set  OT Visit Diagnosis: Unsteadiness on feet (R26.81);Muscle weakness (generalized) (M62.81);History of falling (Z91.81);Ataxia, unspecified (R27.0);Dizziness and  giddiness (R42);Pain Pain - part of body: Leg;Arm (back of her head)                Time: 8842-8769 OT Time Calculation (min): 33 min Charges:  OT General Charges $OT Visit: 1 Visit OT Evaluation $OT Eval Low Complexity: 1 Low OT Treatments $Self Care/Home Management : 8-22 mins  Inocente Browner OTR/L   Inocente SHAUNNA Browner 12/02/2023, 2:36 PM

## 2023-12-02 NOTE — Telephone Encounter (Signed)
Pharmacy Patient Advocate Encounter  Insurance verification completed.    The patient is insured through [employer]. Patient has Medicare and is not eligible for a copay card, but may be able to apply for patient assistance or Medicare RX Payment Plan (Patient Must reach out to their plan, if eligible for payment plan), if available.    Ran test claim for BRILINTA 90MG  and the current 30 day co-pay is $294.90.  Copay applied to deductible. $47.00 after deductible met.   This test claim was processed through [facility]- copay amounts may vary at other pharmacies due to pharmacy/plan contracts, or as the patient moves through the different stages of their insurance plan.

## 2023-12-02 NOTE — Discharge Summary (Addendum)
 Physician Discharge Summary  Stefanie Carter FMW:[id number] DOB: Feb 18, 1946 DOA: 12/01/2023  PCP: Ransom Other, MD  Admit date: 12/01/2023 Discharge date: 12/02/2023  Admitted From: Home Disposition: Home  Recommendations for Outpatient Follow-up:  Follow up with PCP in 1 week with repeat CBC/BMP Outpatient follow-up with neurology Follow up in ED if symptoms worsen or new appear   Home Health: Home with home health PT/OT Equipment/Devices: None  Discharge Condition: Stable CODE STATUS: Full Diet recommendation: Heart healthy  Brief/Interim Summary: 78 y.o. female with medical history significant for hypertension, hyperlipidemia, type 2 diabetes mellitus, anxiety, and recent admission for cerebellar stroke (11/07/2023-11/09/2023) presented with dizziness, nausea, vomiting.  On presentation, head CT revealed 8 mm hypodensity involving the superior left cerebellum.  No emergent large vessel occlusion seen on CTA head and neck.  Neurology was consulted.  During the hospitalization, her condition has improved.  She wants to be home today.  PT recommended home health PT.  Neurology recommended aspirin  and plavix  (patient couldn't afford Brilinta ) for 3 months then aspirin  alone.  Outpatient follow-up with neurology.  Discharge patient home today.  Discharge Diagnoses:   Ataxia in a patient with history of recent cerebellar stroke -head CT revealed 8 mm hypodensity involving the superior left cerebellum.  No emergent large vessel occlusion seen on CTA head and neck.  -MRI of brain showed extension of recent left cerebellar infarct without hemorrhagic transformation or mass effect. -   During the hospitalization, her condition has improved.  She wants to be home today.  PT recommended home health PT.  Neurology recommended aspirin  (aspirin  dose increased to 325 mg daily) and plavix  (patient couldn't afford Brilinta ) for 3 months then aspirin  alone.   Outpatient follow-up with neurology.   Discharge patient home today.  Hypertension -Outpatient follow-up.   Diabetes mellitus type 2 -Continue carb modified diet  COPD -Not in exacerbation.  Continue current inhaled regimen   Hyperlipidemia -Continue statin   Anxiety -Continue home regimen.  Outpatient follow-up   Hypothyroidism -Continue levothyroxine    Discharge Instructions  Discharge Instructions     Ambulatory referral to Neurology   Complete by: As directed    An appointment is requested in approximately: 2 weeks   Diet - low sodium heart healthy   Complete by: As directed    Increase activity slowly   Complete by: As directed       Allergies as of 12/02/2023       Reactions   Penicillins Anaphylaxis   Metformin Hcl Other (See Comments)   Sulfa Antibiotics    Had as a baby; doesn't know reaction.   Sulfamethoxazole-trimethoprim Other (See Comments)   Other reaction(s): Unknown   Varenicline Other (See Comments)   Other reaction(s): intol   Codeine Nausea And Vomiting        Medication List     STOP taking these medications    aspirin  81 MG chewable tablet Replaced by: aspirin  EC 325 MG tablet       TAKE these medications    acetaminophen  325 MG tablet Commonly known as: TYLENOL  Take 325 mg by mouth every 6 (six) hours as needed for moderate pain (pain score 4-6).   aspirin  EC 325 MG tablet Take 1 tablet (325 mg total) by mouth daily. Start taking on: December 03, 2023 Replaces: aspirin  81 MG chewable tablet   atorvastatin  80 MG tablet Commonly known as: LIPITOR  Take 1 tablet (80 mg total) by mouth daily.   Breztri Aerosphere 160-9-4.8 MCG/ACT Aero Generic drug: Budeson-Glycopyrrol-Formoterol  Inhale 2  puffs into the lungs in the morning and at bedtime.   caffeine 200 MG Tabs tablet Take 200 mg by mouth daily as needed.   clopidogrel  75 MG tablet Commonly known as: PLAVIX  Take 1 tablet (75 mg total) by mouth daily. Plavix  and aspirin  for 3 months and then aspirin  alone  as per Neurology Start taking on: December 30, 2023 What changed:  additional instructions These instructions start on December 30, 2023. If you are unsure what to do until then, ask your doctor or other care provider.   Co Q10 100 MG Caps Take 1 capsule by mouth daily.   famotidine  40 MG tablet Commonly known as: PEPCID  Take 1 tablet (40 mg total) by mouth daily.   FISH OIL PO Take 1 capsule by mouth daily.   gabapentin  100 MG capsule Commonly known as: NEURONTIN  Take 100 mg by mouth at bedtime as needed.   ibuprofen 200 MG tablet Commonly known as: ADVIL Take 200 mg by mouth every 6 (six) hours as needed for mild pain (pain score 1-3) or moderate pain (pain score 4-6).   ipratropium-albuterol  0.5-2.5 (3) MG/3ML Soln Commonly known as: DUONEB Take 3 mLs by nebulization every 6 (six) hours as needed.   levothyroxine  125 MCG tablet Commonly known as: SYNTHROID  Take 1 tablet (125 mcg total) by mouth daily before breakfast.   loratadine 10 MG tablet Commonly known as: CLARITIN Take 10 mg by mouth daily as needed for allergies.   LORazepam  0.5 MG tablet Commonly known as: ATIVAN  Take 0.5 mg by mouth every 8 (eight) hours as needed.   Magnesium  200 MG Tabs Take 100 mg by mouth daily.   meclizine  25 MG tablet Commonly known as: ANTIVERT  Take 1 tablet (25 mg total) by mouth 3 (three) times daily as needed for dizziness.   MULTIPLE VITAMIN PO Take 1 tablet by mouth daily.   pseudoephedrine 30 MG tablet Commonly known as: SUDAFED Take 30 mg by mouth every 6 (six) hours as needed for congestion.   senna 8.6 MG tablet Commonly known as: SENOKOT Take 100 mg by mouth daily.   sertraline  100 MG tablet Commonly known as: ZOLOFT  Take 100 mg by mouth daily.   VITAMIN D (CHOLECALCIFEROL) PO Take 1 tablet by mouth daily.   vitamin E 200 UNIT capsule Take 200 Units by mouth daily.              Follow-up Information     [facility].    Specialty: Home Health Services Why: The home health agency will contact you for the next home visit. Contact information: [address] [place] [address] [phone]         Ransom Other, MD. Schedule an appointment as soon as possible for a visit in 1 week(s).   Specialty: Internal Medicine Contact information: [address] [place] [address] [phone]                Allergies  Allergen Reactions   Penicillins Anaphylaxis   Metformin Hcl Other (See Comments)   Sulfa Antibiotics     Had as a baby; doesn't know reaction.   Sulfamethoxazole-Trimethoprim Other (See Comments)    Other reaction(s): Unknown   Varenicline Other (See Comments)    Other reaction(s): intol   Codeine Nausea And Vomiting    Consultations: Neurology   Procedures/Studies: MR BRAIN WO CONTRAST Result Date: 12/01/2023 CLINICAL DATA:  78 year old female code stroke presentation. Left cerebral infarcts last month,  distal left vertebral artery occlusion at that time. EXAM: MRI HEAD WITHOUT CONTRAST TECHNIQUE: Multiplanar, multiecho pulse sequences of the brain and surrounding structures were obtained without intravenous contrast. COMPARISON:  CTA head and neck this morning.  Brain MRI 11/07/2023. FINDINGS: Study is intermittently degraded by motion artifact despite repeated imaging attempts. Brain: Increased area of heterogeneous diffusion in the left cerebellar hemisphere since 11/07/2023, but at the same time fading diffusion restriction. Left SCA territory involvement is new since that time. Associated mild T2 and FLAIR hyperintensity with no hemorrhagic transformation or mass effect. No other restricted diffusion to suggest acute infarction. No midline shift, mass effect, evidence of mass lesion, ventriculomegaly, extra-axial collection or acute intracranial hemorrhage. Cervicomedullary junction and pituitary are within normal limits. Outside of the left cerebellum  gray and white matter signal appears stable when allowing for motion artifact. Vascular: Major intracranial vascular flow voids are preserved, with normalization of the distal left vertebral artery flow void compared to the previous MRI. Skull and upper cervical spine: Motion degraded, abnormal upper cervical spine better demonstrated by CT 8 this morning. Background bone marrow signal remains normal. Sinuses/Orbits: Stable. Other: Trace mastoid air cell fluid is stable. IMPRESSION: 1. Motion degraded exam. 2. Extension of the left cerebellar infarct since 11/07/2023 to involve the superior cerebellar artery territory. But no hemorrhagic transformation or mass effect, and otherwise regressed ischemic cerebellar signal abnormality since last month. 3. No other acute intracranial abnormality identified. Electronically Signed   By: VEAR Hurst M.D.   On: 12/01/2023 07:31   CT ANGIO HEAD NECK W WO CM W PERF (CODE STROKE) Result Date: 12/01/2023 CLINICAL DATA:  78 year old female code stroke. Distal left vertebral artery occlusion and left cerebellar infarcts last month. EXAM: CT ANGIOGRAPHY HEAD AND NECK TECHNIQUE: Multidetector CT imaging of the head and neck was performed using the standard protocol during bolus administration of intravenous contrast. Multiplanar CT image reconstructions and MIPs were obtained to evaluate the vascular anatomy. Carotid stenosis measurements (when applicable) are obtained utilizing NASCET criteria, using the distal internal carotid diameter as the denominator. RADIATION DOSE REDUCTION: This exam was performed according to the departmental dose-optimization program which includes automated exposure control, adjustment of the mA and/or kV according to patient size and/or use of iterative reconstruction technique. CONTRAST:  OMNIPAQUE  IOHEXOL  350 MG/ML SOLN COMPARISON:  Plain head CT 0222 hours today. Brain MRI 11/07/2023. CTA head and neck 11/07/2023. FINDINGS: CTA NECK Skeleton:  Highly abnormal C1-C2 redemonstrated, stable from last month and similar to cervical spine MRI 08/18/2023 where bulky pannus was noted about the odontoid. Superimposed advanced lower cervical spine degeneration and degenerative C3-C4 spondylolisthesis also appears stable. No acute osseous abnormality identified. Upper chest: Stable upper lungs with subpleural reticular scarring. No superior mediastinal lymphadenopathy. Other neck: Stable. Aortic arch: Calcified aortic atherosclerosis.  3 vessel arch. Right carotid system: Brachiocephalic artery and proximal right ICA plaque with no significant stenosis, stable. Left carotid system: Stable left CCA plaque and tortuosity without stenosis. More complex soft and calcified plaque at the left ICA origin and bulb appears stable with less than 50 % stenosis with respect to the distal vessel. Vertebral arteries: Stable right subclavian artery origin calcification. Stable right vertebral artery origin calcified plaque with no significant stenosis. Right vertebral artery is tortuous throughout the neck and remains patent to the skull base with no significant stenosis. Bulky plaque throughout the proximal left subclavian artery redemonstrated and the vessel remains patent. Left vertebral artery origin relatively spared and normal. Left vertebral artery  is tortuous in the neck and remains patent to the skull base now, with improved left V3 segment enhancement compared to last month. CTA HEAD Posterior circulation: Distal left vertebral artery now is patent to the vertebrobasilar junction. Left PICA origin is also faintly enhancing. Fairly codominant appearance of the distal vertebral arteries now. Stable right V4 segment patency, patent right PICA origin. Basilar artery is patent with mild irregularity, no significant stenosis. Patent SCA origins. Fetal type bilateral PCA origins redemonstrated. Bilateral PCA branches are patent and within normal limits. Anterior circulation:  Both ICA siphons are patent. Mild left siphon calcified plaque with no significant stenosis. Mild to moderate right siphon calcified plaque with only mild stenosis. Normal posterior communicating artery origins. Patent carotid termini, MCA and ACA origins appear stable. Anterior communicating artery and median artery of the corpus callosum redemonstrated. Visible ACA branches are patent and within normal limits. Left MCA M1 segment and bifurcation are patent without stenosis. Left MCA branches are stable and within normal limits. Right MCA M1 segment and trifurcation are patent without stenosis. Right MCA branches are stable and within normal limits. Venous sinuses: Patent. Anatomic variants: Fetal type bilateral PCA origins. Median artery of the corpus callosum. Review of the MIP images confirms the above findings IMPRESSION: 1. Negative for large vessel occlusion and recanalization of the Distal Left Vertebral Artery since 11/07/2023. 2. Stable superimposed atherosclerosis in the head and neck. Plaque throughout the proximal left subclavian artery, but no high-grade atherosclerotic stenosis is identified. Aortic Atherosclerosis (ICD10-I70.0). 3. Chronic severe cervical spine degeneration, chronic Pannus about the odontoid process. Electronically Signed   By: VEAR Hurst M.D.   On: 12/01/2023 04:24   CT HEAD CODE STROKE WO CONTRAST Result Date: 12/01/2023 CLINICAL DATA:  Code stroke. Initial evaluation for acute neuro deficit, stroke. EXAM: CT HEAD WITHOUT CONTRAST TECHNIQUE: Contiguous axial images were obtained from the base of the skull through the vertex without intravenous contrast. RADIATION DOSE REDUCTION: This exam was performed according to the departmental dose-optimization program which includes automated exposure control, adjustment of the mA and/or kV according to patient size and/or use of iterative reconstruction technique. COMPARISON:  Prior study from 11/07/2023 FINDINGS: Brain: Cerebral volume  within normal limits. Mild chronic microvascular ischemic disease noted. Previously identified left cerebellar infarcts not well visualized by CT. 8 mm hypodensity involving the superior left cerebellum is seen (series 4, image 228), new from prior, and could reflect an acute to subacute ischemic infarct. No other acute cortically based infarct. No acute intracranial hemorrhage. No mass lesion or midline shift. No hydrocephalus or extra-axial fluid collection. Vascular: No abnormal hyperdense vessel. Skull: Scalp soft tissues demonstrate no acute finding. Calvarium intact. Sinuses/Orbits: Globes and orbital soft tissues within normal limits. Paranasal sinuses are largely clear. No significant mastoid effusion. Other: None. ASPECTS (Alberta Stroke Program Early CT Score) - Ganglionic level infarction (caudate, lentiform nuclei, internal capsule, insula, M1-M3 cortex): 7 - Supraganglionic infarction (M4-M6 cortex): 3 Total score (0-10 with 10 being normal): 10 IMPRESSION: Sign report she 1. 8 mm hypodensity involving the superior left cerebellum, new from prior, and could reflect an acute to subacute ischemic infarct. No acute intracranial hemorrhage. 2. Aspects equals 10. 3. Underlying mild chronic microvascular ischemic disease. Results were called by telephone at the time of interpretation on 12/01/2023 at 2:43 am to provider DAVID [facility] , who verbally acknowledged these results. Electronically Signed   By: Morene Hoard M.D.   On: 12/01/2023 02:44   ECHOCARDIOGRAM COMPLETE Result Date: 11/09/2023  ECHOCARDIOGRAM REPORT   Patient Name:   Stefanie Carter Date of Exam: 11/09/2023 Medical Rec #:  [id number]          Height:       60.0 in Accession #:    [id number]         Weight:       119.7 lb Date of Birth:  Mar 16, 1946         BSA:          1.501 m Patient Age:    77 years           BP:           121/60 mmHg Patient Gender: F                  HR:           72 bpm. Exam Location:  Inpatient Procedure: 2D  Echo, Color Doppler and Cardiac Doppler Indications:    Stroke  History:        Patient has no prior history of Echocardiogram examinations.                 COPD, Signs/Symptoms:Dizziness/Lightheadedness; Risk                 Factors:Hypertension and Diabetes.  Sonographer:    Amy Chionchio Referring Phys: HOWERTER, JUSTIN, B IMPRESSIONS  1. Left ventricular ejection fraction, by estimation, is 60 to 65%. The left ventricle has normal function. The left ventricle has no regional wall motion abnormalities. Left ventricular diastolic parameters are consistent with Grade II diastolic dysfunction (pseudonormalization).  2. Right ventricular systolic function is normal. The right ventricular size is normal.  3. Left atrial size was mild to moderately dilated.  4. The mitral valve is degenerative. Trivial mitral valve regurgitation.  5. There is moderate calcification of the aortic valve. Aortic valve regurgitation is not visualized. Mild aortic valve stenosis. Aortic valve mean gradient measures 11.3 mmHg.  6. The inferior vena cava is normal in size with greater than 50% respiratory variability, suggesting right atrial pressure of 3 mmHg. FINDINGS  Left Ventricle: Left ventricular ejection fraction, by estimation, is 60 to 65%. The left ventricle has normal function. The left ventricle has no regional wall motion abnormalities. The left ventricular internal cavity size was normal in size. There is  no left ventricular hypertrophy. Left ventricular diastolic parameters are consistent with Grade II diastolic dysfunction (pseudonormalization). Right Ventricle: The right ventricular size is normal. Right ventricular systolic function is normal. Left Atrium: Left atrial size was mild to moderately dilated. Right Atrium: Right atrial size was normal in size. Pericardium: There is no evidence of pericardial effusion. Mitral Valve: The mitral valve is degenerative in appearance. Trivial mitral valve regurgitation. MV peak  gradient, 5.3 mmHg. The mean mitral valve gradient is 2.0 mmHg. Tricuspid Valve: Tricuspid valve regurgitation is not demonstrated. Aortic Valve: There is moderate calcification of the aortic valve. Aortic valve regurgitation is not visualized. Mild aortic stenosis is present. Aortic valve mean gradient measures 11.3 mmHg. Aortic valve peak gradient measures 21.9 mmHg. Aortic valve area, by VTI measures 1.42 cm. Aorta: The aortic root is normal in size and structure. Venous: The inferior vena cava is normal in size with greater than 50% respiratory variability, suggesting right atrial pressure of 3 mmHg. IAS/Shunts: No atrial level shunt detected by color flow Doppler.  LEFT VENTRICLE PLAX 2D LVIDd:         4.20 cm     Diastology LVIDs:  2.90 cm     LV e' medial:    6.09 cm/s LV PW:         1.00 cm     LV E/e' medial:  16.6 LV IVS:        0.90 cm     LV e' lateral:   8.92 cm/s LVOT diam:     2.00 cm     LV E/e' lateral: 11.3 LV SV:         53 LV SV Index:   35 LVOT Area:     3.14 cm  LV Volumes (MOD) LV vol d, MOD A2C: 51.0 ml LV vol d, MOD A4C: 57.7 ml LV vol s, MOD A2C: 20.2 ml LV vol s, MOD A4C: 21.7 ml LV SV MOD A2C:     30.8 ml LV SV MOD A4C:     57.7 ml LV SV MOD BP:      34.4 ml RIGHT VENTRICLE             IVC RV Basal diam:  2.30 cm     IVC diam: 1.40 cm RV S prime:     14.40 cm/s TAPSE (M-mode): 1.9 cm LEFT ATRIUM             Index        RIGHT ATRIUM           Index LA Vol (A2C):   50.3 ml 33.52 ml/m  RA Area:     14.70 cm LA Vol (A4C):   66.9 ml 44.58 ml/m  RA Volume:   34.70 ml  23.12 ml/m LA Biplane Vol: 58.7 ml 39.11 ml/m  AORTIC VALVE                     PULMONIC VALVE AV Area (Vmax):    1.33 cm      PV Vmax:       0.91 m/s AV Area (Vmean):   1.20 cm      PV Peak grad:  3.3 mmHg AV Area (VTI):     1.42 cm AV Vmax:           234.00 cm/s AV Vmean:          156.667 cm/s AV VTI:            0.372 m AV Peak Grad:      21.9 mmHg AV Mean Grad:      11.3 mmHg LVOT Vmax:         99.20 cm/s LVOT  Vmean:        59.600 cm/s LVOT VTI:          0.168 m LVOT/AV VTI ratio: 0.45  AORTA Ao Root diam: 2.90 cm MITRAL VALVE                TRICUSPID VALVE MV Area (PHT): 2.58 cm     TR Peak grad:   25.0 mmHg MV Area VTI:   1.44 cm     TR Vmax:        250.00 cm/s MV Peak grad:  5.3 mmHg MV Mean grad:  2.0 mmHg     SHUNTS MV Vmax:       1.15 m/s     Systemic VTI:  0.17 m MV Vmean:      67.2 cm/s    Systemic Diam: 2.00 cm MV Decel Time: 294 msec MV E velocity: 101.00 cm/s MV A velocity: 60.80 cm/s MV E/A ratio:  1.66 Photographer signed  by Ronal Ross Signature Date/Time: 11/09/2023/10:53:47 AM    Final    VAS US  ABI WITH/WO TBI Result Date: 11/09/2023  LOWER EXTREMITY DOPPLER STUDY Patient Name:  Stefanie Carter  Date of Exam:   11/08/2023 Medical Rec #: [id number]           Accession #:    [id number] Date of Birth: Oct 30, 1945          Patient Gender: F Patient Age:   75 years Exam Location:  [facility] Procedure:      VAS US  ABI WITH/WO TBI Referring Phys: RAMESH KC --------------------------------------------------------------------------------  Indications: Ulceration. High Risk Factors: Hypertension, hyperlipidemia, Diabetes, prior CVA.  Comparison Study: None. Performing Technologist: Garnette Rockers  Examination Guidelines: A complete evaluation includes at minimum, Doppler waveform signals and systolic blood pressure reading at the level of bilateral brachial, anterior tibial, and posterior tibial arteries, when vessel segments are accessible. Bilateral testing is considered an integral part of a complete examination. Photoelectric Plethysmograph (PPG) waveforms and toe systolic pressure readings are included as required and additional duplex testing as needed. Limited examinations for reoccurring indications may be performed as noted.  ABI Findings: +--------+------------------+-----+---------+--------+ Right   Rt Pressure (mmHg)IndexWaveform Comment   +--------+------------------+-----+---------+--------+ [id number]                    triphasic         +--------+------------------+-----+---------+--------+ PTA     135               1.08 biphasic          +--------+------------------+-----+---------+--------+ DP      124               0.99 triphasic         +--------+------------------+-----+---------+--------+ +--------+------------------+-----+---------+-------+ Left    Lt Pressure (mmHg)IndexWaveform Comment +--------+------------------+-----+---------+-------+ [id number]                    triphasic        +--------+------------------+-----+---------+-------+ PTA     138               1.10 triphasic        +--------+------------------+-----+---------+-------+ DP      140               1.12 triphasic        +--------+------------------+-----+---------+-------+ +-------+-----------+-----------+------------+------------+ ABI/TBIToday's ABIToday's TBIPrevious ABIPrevious TBI +-------+-----------+-----------+------------+------------+ Right  1.08                                           +-------+-----------+-----------+------------+------------+ Left   1.12                                           +-------+-----------+-----------+------------+------------+  Summary: Right: Resting right ankle-brachial index is within normal range. Left: Resting left ankle-brachial index is within normal range. *See table(s) above for measurements and observations.  Electronically signed by Penne Colorado MD on 11/09/2023 at 10:39:09 AM.    Final    DG Foot 2 Views Right Result Date: 11/07/2023 CLINICAL DATA:  3rd digit wound EXAM: RIGHT FOOT - 2 VIEW COMPARISON:  None Available. FINDINGS: No fracture or dislocation is seen. Moderate degenerative changes of the 1st MTP joint. No destructive osseous changes to suggest osteomyelitis. The visualized soft tissues  are unremarkable. IMPRESSION: No radiographic findings of  osteomyelitis. Electronically Signed   By: Pinkie Pebbles M.D.   On: 11/07/2023 23:30   CT ANGIO HEAD NECK W WO CM Result Date: 11/07/2023 CLINICAL DATA:  Follow-up examination for acute stroke. EXAM: CT ANGIOGRAPHY HEAD AND NECK WITH AND WITHOUT CONTRAST TECHNIQUE: Multidetector CT imaging of the head and neck was performed using the standard protocol during bolus administration of intravenous contrast. Multiplanar CT image reconstructions and MIPs were obtained to evaluate the vascular anatomy. Carotid stenosis measurements (when applicable) are obtained utilizing NASCET criteria, using the distal internal carotid diameter as the denominator. RADIATION DOSE REDUCTION: This exam was performed according to the departmental dose-optimization program which includes automated exposure control, adjustment of the mA and/or kV according to patient size and/or use of iterative reconstruction technique. CONTRAST:  75mL OMNIPAQUE  IOHEXOL  350 MG/ML SOLN COMPARISON:  MRI from earlier the same day. FINDINGS: CT HEAD FINDINGS Brain: Previous identified left cerebellar infarcts again noted, better seen on prior MRI. No other acute large vessel territory infarct. No acute intracranial hemorrhage. No mass lesion or midline shift or mass effect. No hydrocephalus or extra-axial fluid collection. Underlying mild chronic microvascular ischemic disease noted. Vascular: No abnormal hyperdense vessel. Skull: Scalp soft tissues within normal limits for age calvarium intact. Sinuses/Orbits: Globes and orbital soft tissues within normal limits. Paranasal sinuses and mastoid air cells are largely clear. Other: None. Review of the MIP images confirms the above findings CTA NECK FINDINGS Aortic arch: Visualized aortic arch within normal limits for caliber with standard 3 vessel morphology. Moderate aortic atherosclerosis. No significant stenosis about the origin the great vessels. Right carotid system: Right common and internal carotid  arteries are patent without dissection. Mild atheromatous change about the right carotid bulb without stenosis. Left carotid system: Left common and internal carotid arteries are patent without dissection. Moderate atheromatous change about the left carotid bulb without hemodynamically significant greater than 50% stenosis. Vertebral arteries: Both vertebral arteries arise from the subclavian arteries. No significant proximal subclavian artery stenosis. Right vertebral artery dominant and patent without significant stenosis or dissection. Left vertebral artery patent proximally. Increasing attenuation of the distal left vertebral artery at the left V3 segment with subsequent occlusion at the left V4 segment (series 16, images 147, 174), accounting for the signal abnormality seen on prior brain MRI. This is suspected to reflect an underlying dissection. Skeleton: Severe osteoarthritic changes about the C1-2 articulations with degenerative pannus formation. Cystic lucency within the dens is favored to be degenerative in nature. Advanced spondylosis noted elsewhere within the visualized cervicothoracic spine. Other neck: No other acute finding. Upper chest: No other acute finding. Review of the MIP images confirms the above findings CTA HEAD FINDINGS Anterior circulation: Mild atheromatous change about the carotid siphons without hemodynamically significant stenosis. A1 segments patent bilaterally. Normal anterior communicating artery complex. Anterior cerebral arteries patent without stenosis. No M1 stenosis or occlusion. Distal MCA branches perfused and symmetric. Posterior circulation: Dominant right V4 segment patent without significant stenosis. Right PICA patent. Left vertebral artery markedly attenuated as it crosses the dural reflection with complete occlusion of the left V4 segment by its mid aspect (series 16, image 166). Again, this is suspected to reflect an acute dissection given the underlying ischemic  changes. Left PICA not seen. Basilar patent without significant stenosis. Superior cerebral arteries patent bilaterally. Both PCA supplied via hypoplastic P1 segments and robust bilateral posterior communicating arteries. Both PCAs patent without stenosis. Venous sinuses: Patent allowing for timing the contrast  bolus. Anatomic variants: As above.  No aneurysm. Review of the MIP images confirms the above findings IMPRESSION: CT HEAD: 1. Evolving acute ischemic nonhemorrhagic left cerebellar infarcts, better seen on prior brain MRI. 2. No other acute intracranial abnormality. CTA HEAD AND NECK: 1. Increasing attenuation of the distal left V3 segment with subsequent reocclusion at the intradural left V4 segment. Given the presence of underlying ischemic changes on prior brain MRI, this is suspected to reflect an acute dissection. 2. Mild atherosclerotic disease elsewhere about the major arterial vasculature of the head and neck as above. No other hemodynamically significant or correctable stenosis. 3.  Aortic Atherosclerosis (ICD10-I70.0). Electronically Signed   By: Morene Hoard M.D.   On: 11/07/2023 21:55   MR BRAIN WO CONTRAST Result Date: 11/07/2023 CLINICAL DATA:  Initial evaluation for acute dizziness, syncope please/presyncope. EXAM: MRI HEAD WITHOUT CONTRAST TECHNIQUE: Multiplanar, multiecho pulse sequences of the brain and surrounding structures were obtained without intravenous contrast. COMPARISON:  Prior CT from 09/29/2017 FINDINGS: Brain: Mild age-related cerebral atrophy. Patchy T2/FLAIR hyperintensity involving the periventricular deep white matter both cerebral hemispheres as well as the pons, consistent with chronic small vessel ischemic disease, mild in nature. Patchy restricted diffusion involving the mid and lower left cerebellum, consistent with acute to early subacute ischemic infarcts. Largest area infarction measures 1.6 cm. Minimal patchy involvement of the left dorsal medulla  (series 5, image 8). No associated hemorrhage or significant mass effect. No other areas of acute or subacute ischemia. Gray-white matter differentiation otherwise maintained. No acute or chronic intracranial blood products. No mass lesion, midline shift or mass effect. No hydrocephalus or extra-axial fluid collection. Pituitary gland suprasellar region within normal limits. Vascular: Loss of normal flow void within the left V4 segment, which could be related to slow flow and/or occlusion (series 8, image 3). Major intracranial vascular flow voids are otherwise maintained. Skull and upper cervical spine: Severe degenerative osteoarthritic changes present about the C1-2 articulation with degenerative pannus formation (series 7, image 12). 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 Bone marrow signal intensity within normal limits. No scalp soft tissue abnormality. Sinuses/Orbits: Prior bilateral ocular lens replacement. Paranasal sinuses are clear. Small bilateral mastoid effusions noted, of doubtful significance. Negative nasopharynx. Other: None. IMPRESSION: 1. Patchy acute to early subacute ischemic infarcts involving the mid and lower left cerebellum, with minimal patchy involvement of the left dorsal medulla. No associated hemorrhage or significant mass effect. 2. Loss of normal flow void within the left V4 segment, which could be related to slow flow and/or occlusion. 3. Severe degenerative osteoarthritic changes about the C1-2 articulation with degenerative pannus formation. 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 4. Underlying mild age-related cerebral atrophy with chronic small vessel ischemic disease. Electronically Signed   By: Morene Hoard M.D.   On: 11/07/2023 20:30   DG Chest Portable 1 View Result Date: 11/07/2023 CLINICAL  DATA:  Cough EXAM: PORTABLE CHEST 1 VIEW COMPARISON:  08/16/2023 FINDINGS: The heart size and mediastinal contours are within normal limits. Aortic atherosclerosis. No focal airspace consolidation, pleural effusion, or pneumothorax. IMPRESSION: No active disease. Electronically Signed   By: Mabel Converse D.O.   On: 11/07/2023 16:34      Subjective: Patient seen and examined at bedside. No fever, vomiting, chest pain or shortness of breath reported.  Wants to go home today.  Discharge Exam: Vitals:   12/02/23  9170 12/02/23 1159  BP: 112/76 122/60  Pulse: 66 78  Resp:  17  Temp: 98.3 F (36.8 C) 98.4 F (36.9 C)  SpO2: 93% 94%    General exam: Appears calm and comfortable.  On room air. Respiratory system: Bilateral decreased breath sounds at bases Cardiovascular system: S1 & S2 heard, Rate controlled Gastrointestinal system: Abdomen is nondistended, soft and nontender. Normal bowel sounds heard. Extremities: No cyanosis, clubbing, edema  Central nervous system: Alert and oriented. No focal neurological deficits. Moving extremities Skin: No rashes, lesions or ulcers Psychiatry: Flat affect.  Not agitated.    The results of significant diagnostics from this hospitalization (including imaging, microbiology, ancillary and laboratory) are listed below for reference.     Microbiology: No results found for this or any previous visit (from the past 240 hours).   Labs: BNP (last 3 results) No results for input(s): BNP in the last 8760 hours. Basic Metabolic Panel: Recent Labs  Lab 12/01/23 0220 12/02/23 0625  NA 140 140  K 4.0 3.5  CL 104 104  CO2 25 25  GLUCOSE 118* 107*  BUN 19 13  CREATININE 0.67 0.72  CALCIUM  9.4 8.9   Liver Function Tests: Recent Labs  Lab 12/01/23 0220 12/02/23 0625  AST 42* 28  ALT 53* 39  ALKPHOS 91 75  BILITOT 0.6 0.6  PROT 7.3 5.8*  ALBUMIN 4.1 3.2*   No results for input(s): LIPASE, AMYLASE in the last 168 hours. No results  for input(s): AMMONIA in the last 168 hours. CBC: Recent Labs  Lab 12/01/23 0220 12/02/23 0625  WBC 12.8* 7.9  NEUTROABS 10.1* 5.5  HGB 13.0 12.6  HCT 40.5 37.6  MCV 98.8 93.5  PLT 326 287   Cardiac Enzymes: No results for input(s): CKTOTAL, CKMB, CKMBINDEX, TROPONINI in the last 168 hours. BNP: Invalid input(s): POCBNP CBG: Recent Labs  Lab 12/01/23 0219 12/01/23 2109 12/02/23 0603  GLUCAP 114* 150* 116*   D-Dimer No results for input(s): DDIMER in the last 72 hours. Hgb A1c No results for input(s): HGBA1C in the last 72 hours. Lipid Profile No results for input(s): CHOL, HDL, LDLCALC, TRIG, CHOLHDL, LDLDIRECT in the last 72 hours. Thyroid  function studies Recent Labs    12/02/23 0625  TSH 1.415   Anemia work up No results for input(s): VITAMINB12, FOLATE, FERRITIN, TIBC, IRON, RETICCTPCT in the last 72 hours. Urinalysis    Component Value Date/Time   COLORURINE YELLOW 12/01/2023 0253   APPEARANCEUR CLEAR 12/01/2023 0253   LABSPEC 1.020 12/01/2023 0253   PHURINE 5.0 12/01/2023 0253   GLUCOSEU NEGATIVE 12/01/2023 0253   HGBUR NEGATIVE 12/01/2023 0253   BILIRUBINUR NEGATIVE 12/01/2023 0253   BILIRUBINUR negative 12/07/2020 1632   KETONESUR NEGATIVE 12/01/2023 0253   PROTEINUR NEGATIVE 12/01/2023 0253   UROBILINOGEN 0.2 12/07/2020 1632   NITRITE NEGATIVE 12/01/2023 0253   LEUKOCYTESUR NEGATIVE 12/01/2023 0253   Sepsis Labs Recent Labs  Lab 12/01/23 0220 12/02/23 0625  WBC 12.8* 7.9   Microbiology No results found for this or any previous visit (from the past 240 hours).   Time coordinating discharge: 35 minutes  SIGNED:   Sophie Mao, MD  [facility] 12/02/2023, 3:20 PM

## 2023-12-02 NOTE — Progress Notes (Addendum)
 Asymptomatic bradycardia RN reported that patient heart rate dropped to 30-40 afterward it became 60-80 normal sinus rhythm.  Patient is asymptomatic hemodynamically stable. Obtaining EKG.  Checking TSH and T4 level. Per chart review patient is on Valium  and Ativan  as needed which both contribute to bradycardia.  Also this seems like that patient heart rate drops during sleeping due to increased parasympathetic tone.  Ori Kreiter, MD [facility] 12/02/2023, 12:41 AM     Update, EKG showing sinus bradycardia with premature atrial complex.  Heart rate 49. -Holding Valium  and Ativan  as it can worsen the bradycardia.   Petrice Beedy, MD [facility] 12/02/2023, 1:04 AM

## 2023-12-02 NOTE — Plan of Care (Signed)
 Pt to d/c lounge

## 2023-12-02 NOTE — TOC Initial Note (Signed)
 Transition of Care [facility]) - Initial/Assessment Note    Patient Details  Name: Stefanie Carter MRN: [id number] Date of Birth: 1946/08/21  [facility]) CM/SW Contact:    Andrez [name], RN Phone Number: 12/02/2023, 1:59 PM  Clinical Narrative:                  Pt is from home alone. She has friends and neighbors that can check on her.  Pt drives self as needed.  She manages her own medications and denies any issues. She is active with [facility] for home health therapies. Pt asked to continue their services. Beverley with [facility] aware of resumption orders.  Pt states she can arrange transportation home.   Expected Discharge Plan: Home w Home Health Services Barriers to Discharge: Continued Medical Work up   Patient Goals and CMS Choice   CMS Medicare.gov Compare Post Acute Care list provided to:: Patient Choice offered to / list presented to : Patient      Expected Discharge Plan and Services   Discharge Planning Services: CM Consult Post Acute Care Choice: Home Health Living arrangements for the past 2 months: Single Family Home                           HH Arranged: RN, PT, OT, Speech Therapy HH Agency: [facility] Date [facility] Agency Contacted: 12/02/23   Representative spoke with at [facility] Agency: Beverley  Prior Living Arrangements/Services Living arrangements for the past 2 months: Single Family Home Lives with:: Self Patient language and need for interpreter reviewed:: Yes Do you feel safe going back to the place where you live?: Yes        Care giver support system in place?: No (comment) Current home services: DME (walker/ wheelchair/ shower seat) Criminal Activity/Legal Involvement Pertinent to Current Situation/Hospitalization: No - Comment as needed  Activities of Daily Living      Permission Sought/Granted                  Emotional Assessment Appearance:: Appears stated age Attitude/Demeanor/Rapport: Engaged Affect  (typically observed): Accepting Orientation: : Oriented to Self, Oriented to Place, Oriented to  Time, Oriented to Situation   Psych Involvement: No (comment)  Admission diagnosis:  Elevated transaminase level [R74.01] Elevated random blood glucose level [R73.9] Cerebellar stroke (HCC) [I63.9] Cerebrovascular accident (CVA), unspecified mechanism (HCC) [I63.9] Patient Active Problem List   Diagnosis Date Noted   Cerebellar stroke (HCC) 12/01/2023   Vertigo 11/08/2023   Cellulitis of right lower extremity 11/08/2023   DM2 (diabetes mellitus, type 2) (HCC) 11/08/2023   GAD (generalized anxiety disorder) 11/08/2023   Acquired hypothyroidism 11/08/2023   History of COPD 11/08/2023   Acute ischemic stroke (HCC) 11/07/2023   Spinal stenosis 06/20/2013   HLD (hyperlipidemia) 06/20/2013   Essential hypertension 06/20/2013   Anxiety 06/20/2013   PCP:  Ransom Other, MD Pharmacy:   [facility] [place], [place] - [address] [place] [address] Phone: [phone] Fax: [phone]  [facility] [address] [place] - [address] [place] [address] Phone: [phone] Fax: [phone]     Social Drivers of Health (SDOH) Social History: SDOH Screenings   Food Insecurity: No Food Insecurity (12/01/2023)  Housing: Low Risk  (12/01/2023)  Transportation Needs: No Transportation Needs (12/01/2023)  Utilities: Not At Risk (12/01/2023)  Social Connections: Unknown (12/01/2023)  Recent Concern: Social Connections - Socially Isolated (11/08/2023)  Tobacco Use: Medium Risk (12/01/2023)   SDOH Interventions:     Readmission Risk Interventions     No data to display

## 2023-12-02 NOTE — Consult Note (Signed)
 Stroke Neurology Consultation Note  Consult Requested by: Dr. Cheryle  Reason for Consult: stroke  Consult Date: 12/02/23   The history was obtained from the pt and chart.  During history and examination, all items were able to obtain unless otherwise noted.  History of Present Illness:  Stefanie Carter is a 78 y.o. Caucasian female with PMH of hypertension, diabetes, anxiety, cervical DJD, formal smoker, COPD recently admitted last month 11/07/2023 for dizziness, nausea vomiting and a fall.  CT showed left cerebellum small infarct.  CTA head and neck showed left V4 occlusion.  MRI showed left cerebellum and left dorsal medullary small infarcts.  EF 60.65%.  LDL 88 and A1c 6.5.  Discharged on 11/09/2023 with DAPT and Lipitor  80.  On 2//2025 midnight she was doing her nails but had sudden feeling of dizziness with nausea vomiting again.  She came to ER for evaluation.  Telestroke saw her found some left upper extremity ataxia.  CT head concerning for new superior left cerebellum small infarct.  CT head and neck showed left V4 occlusion now recanalized.  But plaque throughout the left proximal subclavian artery but no high-grade stenosis.  MRI showed extension of left cerebellar infarct involving the SCA territory but otherwise regressed previous ischemic cerebellar infarct.  Neurology consulted  LSN: 12/01/2023 1200am TNK Given: No: Recent stroke  Past Medical History:  Diagnosis Date   Anxiety    Diabetes (HCC)    HLD (hyperlipidemia)    HTN (hypertension)    Hypothyroid    Spinal stenosis     Past Surgical History:  Procedure Laterality Date   APPENDECTOMY     SALIVARY GLAND SURGERY     TONSILLECTOMY      Family History  Problem Relation Age of Onset   Heart disease Mother    COPD Mother    Kidney disease Father    Depression Father    Glaucoma Father     Social History:  reports that she quit smoking about 12 years ago. Her smoking use included cigarettes. She started  smoking about 42 years ago. She has a 30 pack-year smoking history. She has never used smokeless tobacco. She reports current alcohol use. She reports that she does not use drugs.  Allergies:  Allergies  Allergen Reactions   Penicillins Anaphylaxis   Metformin Hcl Other (See Comments)   Sulfa Antibiotics     Had as a baby; doesn't know reaction.   Sulfamethoxazole-Trimethoprim Other (See Comments)    Other reaction(s): Unknown   Varenicline Other (See Comments)    Other reaction(s): intol   Codeine Nausea And Vomiting    No current facility-administered medications on file prior to encounter.   Current Outpatient Medications on File Prior to Encounter  Medication Sig Dispense Refill   acetaminophen  (TYLENOL ) 325 MG tablet Take 325 mg by mouth every 6 (six) hours as needed for moderate pain (pain score 4-6).     aspirin  81 MG chewable tablet Chew 1 tablet (81 mg total) by mouth daily. 90 tablet 0   atorvastatin  (LIPITOR ) 80 MG tablet Take 1 tablet (80 mg total) by mouth daily. 30 tablet 1   Budeson-Glycopyrrol-Formoterol  (BREZTRI AEROSPHERE) 160-9-4.8 MCG/ACT AERO Inhale 2 puffs into the lungs in the morning and at bedtime.     caffeine 200 MG TABS tablet Take 200 mg by mouth daily as needed.     Coenzyme Q10 (CO Q10) 100 MG CAPS Take 1 capsule by mouth daily.     famotidine  (PEPCID ) 40 MG tablet Take  1 tablet (40 mg total) by mouth daily.     gabapentin  (NEURONTIN ) 100 MG capsule Take 100 mg by mouth at bedtime as needed.     ibuprofen (ADVIL) 200 MG tablet Take 200 mg by mouth every 6 (six) hours as needed for mild pain (pain score 1-3) or moderate pain (pain score 4-6).     ipratropium-albuterol  (DUONEB) 0.5-2.5 (3) MG/3ML SOLN Take 3 mLs by nebulization every 6 (six) hours as needed.     levothyroxine  (SYNTHROID ) 125 MCG tablet Take 1 tablet (125 mcg total) by mouth daily before breakfast.     loratadine (CLARITIN) 10 MG tablet Take 10 mg by mouth daily as needed for allergies.      LORazepam  (ATIVAN ) 0.5 MG tablet Take 0.5 mg by mouth every 8 (eight) hours as needed.     Magnesium  200 MG TABS Take 100 mg by mouth daily.     meclizine  (ANTIVERT ) 25 MG tablet Take 1 tablet (25 mg total) by mouth 3 (three) times daily as needed for dizziness. 30 tablet 0   MULTIPLE VITAMIN PO Take 1 tablet by mouth daily.     Omega-3 Fatty Acids (FISH OIL PO) Take 1 capsule by mouth daily.     pseudoephedrine (SUDAFED) 30 MG tablet Take 30 mg by mouth every 6 (six) hours as needed for congestion.     senna (SENOKOT) 8.6 MG tablet Take 100 mg by mouth daily.     sertraline  (ZOLOFT ) 100 MG tablet Take 100 mg by mouth daily.     VITAMIN D, CHOLECALCIFEROL, PO Take 1 tablet by mouth daily.     vitamin E 200 UNIT capsule Take 200 Units by mouth daily.      Review of Systems: A full ROS was attempted today and was able to be performed.  Systems assessed include - Constitutional, Eyes, HENT, Respiratory, Cardiovascular, Gastrointestinal, Genitourinary, Integument/breast, Hematologic/lymphatic, Musculoskeletal, Neurological, Behavioral/Psych, Endocrine, Allergic/Immunologic - with pertinent responses as per HPI.  Physical Examination: Temp:  [97.6 F (36.4 C)-99.8 F (37.7 C)] 99.8 F (37.7 C) (02/05 1537) Pulse Rate:  [66-83] 72 (02/05 1537) Resp:  [17-25] 17 (02/05 1159) BP: (111-167)/(60-85) 126/64 (02/05 1537) SpO2:  [90 %-98 %] 93 % (02/05 1537)  General - well nourished, well developed, in no apparent distress.    Ophthalmologic - fundi not visualized due to noncooperation.    Cardiovascular - regular rhythm and rate  Mental Status -  Level of arousal and orientation to time, place, and person were intact. Language including expression, naming, repetition, comprehension was assessed and found intact. Fund of Knowledge was assessed and was intact.  Cranial Nerves II - XII - II - Vision intact OU. III, IV, VI - Extraocular movements intact. V - Facial sensation intact  bilaterally. VII - Facial movement intact bilaterally. VIII - Hearing & vestibular intact bilaterally. X - Palate elevates symmetrically. XI - Chin turning & shoulder shrug intact bilaterally. XII - Tongue protrusion intact.  Motor Strength - The patient's strength was normal in all extremities and pronator drift was absent.   Motor Tone & Bulk - Muscle tone was assessed at the neck and appendages and was normal.  Bulk was normal and fasciculations were absent.   Reflexes - The patient's reflexes were normal in all extremities and she had no pathological reflexes.  Sensory - Light touch, temperature/pinprick were assessed and were normal.    Coordination - The patient had normal movements in the hands and right foot with no ataxia or dysmetria.  However left heel-to-shin mild dysmetria.  Tremor was absent.  Gait and Station - deferred  Data Reviewed: MR BRAIN WO CONTRAST Result Date: 12/01/2023 CLINICAL DATA:  78 year old female code stroke presentation. Left cerebral infarcts last month, distal left vertebral artery occlusion at that time. EXAM: MRI HEAD WITHOUT CONTRAST TECHNIQUE: Multiplanar, multiecho pulse sequences of the brain and surrounding structures were obtained without intravenous contrast. COMPARISON:  CTA head and neck this morning.  Brain MRI 11/07/2023. FINDINGS: Study is intermittently degraded by motion artifact despite repeated imaging attempts. Brain: Increased area of heterogeneous diffusion in the left cerebellar hemisphere since 11/07/2023, but at the same time fading diffusion restriction. Left SCA territory involvement is new since that time. Associated mild T2 and FLAIR hyperintensity with no hemorrhagic transformation or mass effect. No other restricted diffusion to suggest acute infarction. No midline shift, mass effect, evidence of mass lesion, ventriculomegaly, extra-axial collection or acute intracranial hemorrhage. Cervicomedullary junction and pituitary are  within normal limits. Outside of the left cerebellum gray and white matter signal appears stable when allowing for motion artifact. Vascular: Major intracranial vascular flow voids are preserved, with normalization of the distal left vertebral artery flow void compared to the previous MRI. Skull and upper cervical spine: Motion degraded, abnormal upper cervical spine better demonstrated by CT 8 this morning. Background bone marrow signal remains normal. Sinuses/Orbits: Stable. Other: Trace mastoid air cell fluid is stable. IMPRESSION: 1. Motion degraded exam. 2. Extension of the left cerebellar infarct since 11/07/2023 to involve the superior cerebellar artery territory. But no hemorrhagic transformation or mass effect, and otherwise regressed ischemic cerebellar signal abnormality since last month. 3. No other acute intracranial abnormality identified. Electronically Signed   By: VEAR Hurst M.D.   On: 12/01/2023 07:31   CT ANGIO HEAD NECK W WO CM W PERF (CODE STROKE) Result Date: 12/01/2023 CLINICAL DATA:  78 year old female code stroke. Distal left vertebral artery occlusion and left cerebellar infarcts last month. EXAM: CT ANGIOGRAPHY HEAD AND NECK TECHNIQUE: Multidetector CT imaging of the head and neck was performed using the standard protocol during bolus administration of intravenous contrast. Multiplanar CT image reconstructions and MIPs were obtained to evaluate the vascular anatomy. Carotid stenosis measurements (when applicable) are obtained utilizing NASCET criteria, using the distal internal carotid diameter as the denominator. RADIATION DOSE REDUCTION: This exam was performed according to the departmental dose-optimization program which includes automated exposure control, adjustment of the mA and/or kV according to patient size and/or use of iterative reconstruction technique. CONTRAST:  OMNIPAQUE  IOHEXOL  350 MG/ML SOLN COMPARISON:  Plain head CT 0222 hours today. Brain MRI 11/07/2023. CTA head  and neck 11/07/2023. FINDINGS: CTA NECK Skeleton: Highly abnormal C1-C2 redemonstrated, stable from last month and similar to cervical spine MRI 08/18/2023 where bulky pannus was noted about the odontoid. Superimposed advanced lower cervical spine degeneration and degenerative C3-C4 spondylolisthesis also appears stable. No acute osseous abnormality identified. Upper chest: Stable upper lungs with subpleural reticular scarring. No superior mediastinal lymphadenopathy. Other neck: Stable. Aortic arch: Calcified aortic atherosclerosis.  3 vessel arch. Right carotid system: Brachiocephalic artery and proximal right ICA plaque with no significant stenosis, stable. Left carotid system: Stable left CCA plaque and tortuosity without stenosis. More complex soft and calcified plaque at the left ICA origin and bulb appears stable with less than 50 % stenosis with respect to the distal vessel. Vertebral arteries: Stable right subclavian artery origin calcification. Stable right vertebral artery origin calcified plaque with no significant stenosis. Right vertebral artery is tortuous throughout  the neck and remains patent to the skull base with no significant stenosis. Bulky plaque throughout the proximal left subclavian artery redemonstrated and the vessel remains patent. Left vertebral artery origin relatively spared and normal. Left vertebral artery is tortuous in the neck and remains patent to the skull base now, with improved left V3 segment enhancement compared to last month. CTA HEAD Posterior circulation: Distal left vertebral artery now is patent to the vertebrobasilar junction. Left PICA origin is also faintly enhancing. Fairly codominant appearance of the distal vertebral arteries now. Stable right V4 segment patency, patent right PICA origin. Basilar artery is patent with mild irregularity, no significant stenosis. Patent SCA origins. Fetal type bilateral PCA origins redemonstrated. Bilateral PCA branches are patent  and within normal limits. Anterior circulation: Both ICA siphons are patent. Mild left siphon calcified plaque with no significant stenosis. Mild to moderate right siphon calcified plaque with only mild stenosis. Normal posterior communicating artery origins. Patent carotid termini, MCA and ACA origins appear stable. Anterior communicating artery and median artery of the corpus callosum redemonstrated. Visible ACA branches are patent and within normal limits. Left MCA M1 segment and bifurcation are patent without stenosis. Left MCA branches are stable and within normal limits. Right MCA M1 segment and trifurcation are patent without stenosis. Right MCA branches are stable and within normal limits. Venous sinuses: Patent. Anatomic variants: Fetal type bilateral PCA origins. Median artery of the corpus callosum. Review of the MIP images confirms the above findings IMPRESSION: 1. Negative for large vessel occlusion and recanalization of the Distal Left Vertebral Artery since 11/07/2023. 2. Stable superimposed atherosclerosis in the head and neck. Plaque throughout the proximal left subclavian artery, but no high-grade atherosclerotic stenosis is identified. Aortic Atherosclerosis (ICD10-I70.0). 3. Chronic severe cervical spine degeneration, chronic Pannus about the odontoid process. Electronically Signed   By: VEAR Hurst M.D.   On: 12/01/2023 04:24   CT HEAD CODE STROKE WO CONTRAST Result Date: 12/01/2023 CLINICAL DATA:  Code stroke. Initial evaluation for acute neuro deficit, stroke. EXAM: CT HEAD WITHOUT CONTRAST TECHNIQUE: Contiguous axial images were obtained from the base of the skull through the vertex without intravenous contrast. RADIATION DOSE REDUCTION: This exam was performed according to the departmental dose-optimization program which includes automated exposure control, adjustment of the mA and/or kV according to patient size and/or use of iterative reconstruction technique. COMPARISON:  Prior study from  11/07/2023 FINDINGS: Brain: Cerebral volume within normal limits. Mild chronic microvascular ischemic disease noted. Previously identified left cerebellar infarcts not well visualized by CT. 8 mm hypodensity involving the superior left cerebellum is seen (series 4, image 228), new from prior, and could reflect an acute to subacute ischemic infarct. No other acute cortically based infarct. No acute intracranial hemorrhage. No mass lesion or midline shift. No hydrocephalus or extra-axial fluid collection. Vascular: No abnormal hyperdense vessel. Skull: Scalp soft tissues demonstrate no acute finding. Calvarium intact. Sinuses/Orbits: Globes and orbital soft tissues within normal limits. Paranasal sinuses are largely clear. No significant mastoid effusion. Other: None. ASPECTS ([place] Stroke Program Early CT Score) - Ganglionic level infarction (caudate, lentiform nuclei, internal capsule, insula, M1-M3 cortex): 7 - Supraganglionic infarction (M4-M6 cortex): 3 Total score (0-10 with 10 being normal): 10 IMPRESSION: Sign report she 1. 8 mm hypodensity involving the superior left cerebellum, new from prior, and could reflect an acute to subacute ischemic infarct. No acute intracranial hemorrhage. 2. Aspects equals 10. 3. Underlying mild chronic microvascular ischemic disease. Results were called by telephone at the time of interpretation  on 12/01/2023 at 2:43 am to provider [facility]ID [facility] , who verbally acknowledged these results. Electronically Signed   By: Morene Hoard M.D.   On: 12/01/2023 02:44   ECHOCARDIOGRAM COMPLETE Result Date: 11/09/2023    ECHOCARDIOGRAM REPORT   Patient Name:   Tilley Faeth Date of Exam: 11/09/2023 Medical Rec #:  [id number]          Height:       60.0 in Accession #:    [id number]         Weight:       119.7 lb Date of Birth:  11/28/1945         BSA:          1.501 m Patient Age:    77 years           BP:           121/60 mmHg Patient Gender: F                  HR:           72  bpm. Exam Location:  Inpatient Procedure: 2D Echo, Color Doppler and Cardiac Doppler Indications:    Stroke  History:        Patient has no prior history of Echocardiogram examinations.                 COPD, Signs/Symptoms:Dizziness/Lightheadedness; Risk                 Factors:Hypertension and Diabetes.  Sonographer:    Amy Chionchio Referring Phys: HOWERTER, JUSTIN, B IMPRESSIONS  1. Left ventricular ejection fraction, by estimation, is 60 to 65%. The left ventricle has normal function. The left ventricle has no regional wall motion abnormalities. Left ventricular diastolic parameters are consistent with Grade II diastolic dysfunction (pseudonormalization).  2. Right ventricular systolic function is normal. The right ventricular size is normal.  3. Left atrial size was mild to moderately dilated.  4. The mitral valve is degenerative. Trivial mitral valve regurgitation.  5. There is moderate calcification of the aortic valve. Aortic valve regurgitation is not visualized. Mild aortic valve stenosis. Aortic valve mean gradient measures 11.3 mmHg.  6. The inferior vena cava is normal in size with greater than 50% respiratory variability, suggesting right atrial pressure of 3 mmHg. FINDINGS  Left Ventricle: Left ventricular ejection fraction, by estimation, is 60 to 65%. The left ventricle has normal function. The left ventricle has no regional wall motion abnormalities. The left ventricular internal cavity size was normal in size. There is  no left ventricular hypertrophy. Left ventricular diastolic parameters are consistent with Grade II diastolic dysfunction (pseudonormalization). Right Ventricle: The right ventricular size is normal. Right ventricular systolic function is normal. Left Atrium: Left atrial size was mild to moderately dilated. Right Atrium: Right atrial size was normal in size. Pericardium: There is no evidence of pericardial effusion. Mitral Valve: The mitral valve is degenerative in appearance.  Trivial mitral valve regurgitation. MV peak gradient, 5.3 mmHg. The mean mitral valve gradient is 2.0 mmHg. Tricuspid Valve: Tricuspid valve regurgitation is not demonstrated. Aortic Valve: There is moderate calcification of the aortic valve. Aortic valve regurgitation is not visualized. Mild aortic stenosis is present. Aortic valve mean gradient measures 11.3 mmHg. Aortic valve peak gradient measures 21.9 mmHg. Aortic valve area, by VTI measures 1.42 cm. Aorta: The aortic root is normal in size and structure. Venous: The inferior vena cava is normal in size with greater than 50% respiratory variability, suggesting  right atrial pressure of 3 mmHg. IAS/Shunts: No atrial level shunt detected by color flow Doppler.  LEFT VENTRICLE PLAX 2D LVIDd:         4.20 cm     Diastology LVIDs:         2.90 cm     LV e' medial:    6.09 cm/s LV PW:         1.00 cm     LV E/e' medial:  16.6 LV IVS:        0.90 cm     LV e' lateral:   8.92 cm/s LVOT diam:     2.00 cm     LV E/e' lateral: 11.3 LV SV:         53 LV SV Index:   35 LVOT Area:     3.14 cm  LV Volumes (MOD) LV vol d, MOD A2C: 51.0 ml LV vol d, MOD A4C: 57.7 ml LV vol s, MOD A2C: 20.2 ml LV vol s, MOD A4C: 21.7 ml LV SV MOD A2C:     30.8 ml LV SV MOD A4C:     57.7 ml LV SV MOD BP:      34.4 ml RIGHT VENTRICLE             IVC RV Basal diam:  2.30 cm     IVC diam: 1.40 cm RV S prime:     14.40 cm/s TAPSE (M-mode): 1.9 cm LEFT ATRIUM             Index        RIGHT ATRIUM           Index LA Vol (A2C):   50.3 ml 33.52 ml/m  RA Area:     14.70 cm LA Vol (A4C):   66.9 ml 44.58 ml/m  RA Volume:   34.70 ml  23.12 ml/m LA Biplane Vol: 58.7 ml 39.11 ml/m  AORTIC VALVE                     PULMONIC VALVE AV Area (Vmax):    1.33 cm      PV Vmax:       0.91 m/s AV Area (Vmean):   1.20 cm      PV Peak grad:  3.3 mmHg AV Area (VTI):     1.42 cm AV Vmax:           234.00 cm/s AV Vmean:          156.667 cm/s AV VTI:            0.372 m AV Peak Grad:      21.9 mmHg AV Mean Grad:       11.3 mmHg LVOT Vmax:         99.20 cm/s LVOT Vmean:        59.600 cm/s LVOT VTI:          0.168 m LVOT/AV VTI ratio: 0.45  AORTA Ao Root diam: 2.90 cm MITRAL VALVE                TRICUSPID VALVE MV Area (PHT): 2.58 cm     TR Peak grad:   25.0 mmHg MV Area VTI:   1.44 cm     TR Vmax:        250.00 cm/s MV Peak grad:  5.3 mmHg MV Mean grad:  2.0 mmHg     SHUNTS MV Vmax:       1.15 m/s  Systemic VTI:  0.17 m MV Vmean:      67.2 cm/s    Systemic Diam: 2.00 cm MV Decel Time: 294 msec MV E velocity: 101.00 cm/s MV A velocity: 60.80 cm/s MV E/A ratio:  1.66 Ronal Ross Electronically signed by Ronal Ross Signature Date/Time: 11/09/2023/10:53:47 AM    Final    VAS US  ABI WITH/WO TBI Result Date: 11/09/2023  LOWER EXTREMITY DOPPLER STUDY Patient Name:  Adina Puzzo  Date of Exam:   11/08/2023 Medical Rec #: [id number]           Accession #:    [id number] Date of Birth: 06/21/1946          Patient Gender: F Patient Age:   89 years Exam Location:  [facility] Procedure:      VAS US  ABI WITH/WO TBI Referring Phys: RAMESH KC --------------------------------------------------------------------------------  Indications: Ulceration. High Risk Factors: Hypertension, hyperlipidemia, Diabetes, prior CVA.  Comparison Study: None. Performing Technologist: Garnette Rockers  Examination Guidelines: A complete evaluation includes at minimum, Doppler waveform signals and systolic blood pressure reading at the level of bilateral brachial, anterior tibial, and posterior tibial arteries, when vessel segments are accessible. Bilateral testing is considered an integral part of a complete examination. Photoelectric Plethysmograph (PPG) waveforms and toe systolic pressure readings are included as required and additional duplex testing as needed. Limited examinations for reoccurring indications may be performed as noted.  ABI Findings: +--------+------------------+-----+---------+--------+ Right   Rt Pressure  (mmHg)IndexWaveform Comment  +--------+------------------+-----+---------+--------+ [id number]                    triphasic         +--------+------------------+-----+---------+--------+ PTA     135               1.08 biphasic          +--------+------------------+-----+---------+--------+ DP      124               0.99 triphasic         +--------+------------------+-----+---------+--------+ +--------+------------------+-----+---------+-------+ Left    Lt Pressure (mmHg)IndexWaveform Comment +--------+------------------+-----+---------+-------+ [id number]                    triphasic        +--------+------------------+-----+---------+-------+ PTA     138               1.10 triphasic        +--------+------------------+-----+---------+-------+ DP      140               1.12 triphasic        +--------+------------------+-----+---------+-------+ +-------+-----------+-----------+------------+------------+ ABI/TBIToday's ABIToday's TBIPrevious ABIPrevious TBI +-------+-----------+-----------+------------+------------+ Right  1.08                                           +-------+-----------+-----------+------------+------------+ Left   1.12                                           +-------+-----------+-----------+------------+------------+  Summary: Right: Resting right ankle-brachial index is within normal range. Left: Resting left ankle-brachial index is within normal range. *See table(s) above for measurements and observations.  Electronically signed by Penne Colorado MD on 11/09/2023 at 10:39:09 AM.    Final    DG Foot 2 Views Right  Result Date: 11/07/2023 CLINICAL DATA:  3rd digit wound EXAM: RIGHT FOOT - 2 VIEW COMPARISON:  None Available. FINDINGS: No fracture or dislocation is seen. Moderate degenerative changes of the 1st MTP joint. No destructive osseous changes to suggest osteomyelitis. The visualized soft tissues are unremarkable.  IMPRESSION: No radiographic findings of osteomyelitis. Electronically Signed   By: Pinkie Pebbles M.D.   On: 11/07/2023 23:30   CT ANGIO HEAD NECK W WO CM Result Date: 11/07/2023 CLINICAL DATA:  Follow-up examination for acute stroke. EXAM: CT ANGIOGRAPHY HEAD AND NECK WITH AND WITHOUT CONTRAST TECHNIQUE: Multidetector CT imaging of the head and neck was performed using the standard protocol during bolus administration of intravenous contrast. Multiplanar CT image reconstructions and MIPs were obtained to evaluate the vascular anatomy. Carotid stenosis measurements (when applicable) are obtained utilizing NASCET criteria, using the distal internal carotid diameter as the denominator. RADIATION DOSE REDUCTION: This exam was performed according to the departmental dose-optimization program which includes automated exposure control, adjustment of the mA and/or kV according to patient size and/or use of iterative reconstruction technique. CONTRAST:  75mL OMNIPAQUE  IOHEXOL  350 MG/ML SOLN COMPARISON:  MRI from earlier the same day. FINDINGS: CT HEAD FINDINGS Brain: Previous identified left cerebellar infarcts again noted, better seen on prior MRI. No other acute large vessel territory infarct. No acute intracranial hemorrhage. No mass lesion or midline shift or mass effect. No hydrocephalus or extra-axial fluid collection. Underlying mild chronic microvascular ischemic disease noted. Vascular: No abnormal hyperdense vessel. Skull: Scalp soft tissues within normal limits for age calvarium intact. Sinuses/Orbits: Globes and orbital soft tissues within normal limits. Paranasal sinuses and mastoid air cells are largely clear. Other: None. Review of the MIP images confirms the above findings CTA NECK FINDINGS Aortic arch: Visualized aortic arch within normal limits for caliber with standard 3 vessel morphology. Moderate aortic atherosclerosis. No significant stenosis about the origin the great vessels. Right carotid  system: Right common and internal carotid arteries are patent without dissection. Mild atheromatous change about the right carotid bulb without stenosis. Left carotid system: Left common and internal carotid arteries are patent without dissection. Moderate atheromatous change about the left carotid bulb without hemodynamically significant greater than 50% stenosis. Vertebral arteries: Both vertebral arteries arise from the subclavian arteries. No significant proximal subclavian artery stenosis. Right vertebral artery dominant and patent without significant stenosis or dissection. Left vertebral artery patent proximally. Increasing attenuation of the distal left vertebral artery at the left V3 segment with subsequent occlusion at the left V4 segment (series 16, images 147, 174), accounting for the signal abnormality seen on prior brain MRI. This is suspected to reflect an underlying dissection. Skeleton: Severe osteoarthritic changes about the C1-2 articulations with degenerative pannus formation. Cystic lucency within the dens is favored to be degenerative in nature. Advanced spondylosis noted elsewhere within the visualized cervicothoracic spine. Other neck: No other acute finding. Upper chest: No other acute finding. Review of the MIP images confirms the above findings CTA HEAD FINDINGS Anterior circulation: Mild atheromatous change about the carotid siphons without hemodynamically significant stenosis. A1 segments patent bilaterally. Normal anterior communicating artery complex. Anterior cerebral arteries patent without stenosis. No M1 stenosis or occlusion. Distal MCA branches perfused and symmetric. Posterior circulation: Dominant right V4 segment patent without significant stenosis. Right PICA patent. Left vertebral artery markedly attenuated as it crosses the dural reflection with complete occlusion of the left V4 segment by its mid aspect (series 16, image 166). Again, this is suspected to reflect an acute  dissection  given the underlying ischemic changes. Left PICA not seen. Basilar patent without significant stenosis. Superior cerebral arteries patent bilaterally. Both PCA supplied via hypoplastic P1 segments and robust bilateral posterior communicating arteries. Both PCAs patent without stenosis. Venous sinuses: Patent allowing for timing the contrast bolus. Anatomic variants: As above.  No aneurysm. Review of the MIP images confirms the above findings IMPRESSION: CT HEAD: 1. Evolving acute ischemic nonhemorrhagic left cerebellar infarcts, better seen on prior brain MRI. 2. No other acute intracranial abnormality. CTA HEAD AND NECK: 1. Increasing attenuation of the distal left V3 segment with subsequent reocclusion at the intradural left V4 segment. Given the presence of underlying ischemic changes on prior brain MRI, this is suspected to reflect an acute dissection. 2. Mild atherosclerotic disease elsewhere about the major arterial vasculature of the head and neck as above. No other hemodynamically significant or correctable stenosis. 3.  Aortic Atherosclerosis (ICD10-I70.0). Electronically Signed   By: Morene Hoard M.D.   On: 11/07/2023 21:55   MR BRAIN WO CONTRAST Result Date: 11/07/2023 CLINICAL DATA:  Initial evaluation for acute dizziness, syncope please/presyncope. EXAM: MRI HEAD WITHOUT CONTRAST TECHNIQUE: Multiplanar, multiecho pulse sequences of the brain and surrounding structures were obtained without intravenous contrast. COMPARISON:  Prior CT from 09/29/2017 FINDINGS: Brain: Mild age-related cerebral atrophy. Patchy T2/FLAIR hyperintensity involving the periventricular deep white matter both cerebral hemispheres as well as the pons, consistent with chronic small vessel ischemic disease, mild in nature. Patchy restricted diffusion involving the mid and lower left cerebellum, consistent with acute to early subacute ischemic infarcts. Largest area infarction measures 1.6 cm. Minimal patchy  involvement of the left dorsal medulla (series 5, image 8). No associated hemorrhage or significant mass effect. No other areas of acute or subacute ischemia. Gray-white matter differentiation otherwise maintained. No acute or chronic intracranial blood products. No mass lesion, midline shift or mass effect. No hydrocephalus or extra-axial fluid collection. Pituitary gland suprasellar region within normal limits. Vascular: Loss of normal flow void within the left V4 segment, which could be related to slow flow and/or occlusion (series 8, image 3). Major intracranial vascular flow voids are otherwise maintained. Skull and upper cervical spine: Severe degenerative osteoarthritic changes present about the C1-2 articulation with degenerative pannus formation (series 7, image 12). 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 Bone marrow signal intensity within normal limits. No scalp soft tissue abnormality. Sinuses/Orbits: Prior bilateral ocular lens replacement. Paranasal sinuses are clear. Small bilateral mastoid effusions noted, of doubtful significance. Negative nasopharynx. Other: None. IMPRESSION: 1. Patchy acute to early subacute ischemic infarcts involving the mid and lower left cerebellum, with minimal patchy involvement of the left dorsal medulla. No associated hemorrhage or significant mass effect. 2. Loss of normal flow void within the left V4 segment, which could be related to slow flow and/or occlusion. 3. Severe degenerative osteoarthritic changes about the C1-2 articulation with degenerative pannus formation. 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 4. Underlying mild age-related cerebral atrophy with chronic small vessel ischemic disease. Electronically Signed   By: Morene Hoard M.D.   On: 11/07/2023 20:30   DG Chest Portable 1  View Result Date: 11/07/2023 CLINICAL DATA:  Cough EXAM: PORTABLE CHEST 1 VIEW COMPARISON:  08/16/2023 FINDINGS: The heart size and mediastinal contours are within normal limits. Aortic atherosclerosis. No focal airspace consolidation, pleural effusion, or pneumothorax. IMPRESSION: No active disease. Electronically Signed  By: Mabel Converse D.O.   On: 11/07/2023 16:34    Assessment/plan: 78 y.o. female with PMH of hypertension, diabetes, anxiety, cervical DJD, formal smoker, COPD recently admitted last month 11/07/2023 for left PICA small infarct.  CTA head and neck showed left V4 occlusion.  MRI showed left cerebellum and left dorsal medullary small infarcts.  EF 60.65%.  LDL 88 and A1c 6.5.  Discharged on 11/09/2023 with DAPT and Lipitor  80. Now admitted for recurrent dizziness with nausea vomiting. CT head concerning for new superior left cerebellum small infarct.  CT head and neck showed left V4 occlusion now recanalized, but plaque throughout the left proximal subclavian artery but no high-grade stenosis.  MRI showed small extension of left cerebellar infarct involving the SCA territory but otherwise regressed previous ischemic cerebellar infarct. Pt stroke likely large vessel disease from left subclavian artery atherosclerosis with artery to artery emboli.  However cannot completely rule out cardioembolic source.   Checked P2 Y12 after Plavix  which was 176, then switch from Plavix  to Brilinta , checked P2 Y12 down to 28.  Plan to discharge on aspirin  and Brilinta , however, patient has high co-pay for Brilinta .  Will continue Plavix  but increase aspirin  from 81 to 325 this time.  Continue DAPT for 3 months and then aspirin  alone.  Continue Lipitor  80.  PT and OT recommend home health.  Will follow-up with [facility] stroke clinic in 4 weeks.  Thank you for this consultation and allowing us  to participate in the care of this patient.  Ary Cummins, MD PhD Stroke Neurology 12/02/2023 5:58 PM

## 2023-12-03 ENCOUNTER — Other Ambulatory Visit (HOSPITAL_COMMUNITY): Payer: Self-pay

## 2023-12-03 ENCOUNTER — Encounter: Payer: Self-pay | Admitting: Neurology

## 2023-12-03 NOTE — Telephone Encounter (Signed)
 Pt requesting a refill for Brilinta  90 mg. Pt said Dr. Janett Medin suppose to prescribe because saw him in the hospital. Can send to [id number]

## 2023-12-03 NOTE — Telephone Encounter (Signed)
 Called the patient and advised that per note she was dc home on asa and plavix  due to cost. Pt states she would like to go back to brillinta I advised that because she has not been seen here and Dr Rosemarie is not present in the office we would have to continue with her current orders for now until could further.   Plan to discharge on aspirin  and Brilinta , however, patient has high co-pay for Brilinta .  Will continue Plavix  but increase aspirin  from 81 to 325 this time.  Continue DAPT for 3 months and then aspirin  alone.  Continue Lipitor  80.  PT and OT recommend home health.  Will follow-up with [facility] stroke clinic in 4 weeks.

## 2023-12-03 NOTE — Telephone Encounter (Signed)
 error

## 2023-12-04 DIAGNOSIS — E119 Type 2 diabetes mellitus without complications: Secondary | ICD-10-CM | POA: Diagnosis not present

## 2023-12-04 DIAGNOSIS — M4802 Spinal stenosis, cervical region: Secondary | ICD-10-CM | POA: Diagnosis not present

## 2023-12-04 DIAGNOSIS — I69398 Other sequelae of cerebral infarction: Secondary | ICD-10-CM | POA: Diagnosis not present

## 2023-12-04 DIAGNOSIS — J449 Chronic obstructive pulmonary disease, unspecified: Secondary | ICD-10-CM | POA: Diagnosis not present

## 2023-12-04 DIAGNOSIS — F411 Generalized anxiety disorder: Secondary | ICD-10-CM | POA: Diagnosis not present

## 2023-12-04 DIAGNOSIS — I10 Essential (primary) hypertension: Secondary | ICD-10-CM | POA: Diagnosis not present

## 2023-12-04 DIAGNOSIS — I69393 Ataxia following cerebral infarction: Secondary | ICD-10-CM | POA: Diagnosis not present

## 2023-12-04 DIAGNOSIS — R2681 Unsteadiness on feet: Secondary | ICD-10-CM | POA: Diagnosis not present

## 2023-12-04 DIAGNOSIS — L03115 Cellulitis of right lower limb: Secondary | ICD-10-CM | POA: Diagnosis not present

## 2023-12-07 ENCOUNTER — Emergency Department (HOSPITAL_COMMUNITY)
Admission: EM | Admit: 2023-12-07 | Discharge: 2023-12-07 | Discharge status: Against Medical Advice | Payer: Medicare HMO | Attending: Emergency Medicine | Admitting: Emergency Medicine

## 2023-12-07 ENCOUNTER — Other Ambulatory Visit: Payer: Self-pay

## 2023-12-07 ENCOUNTER — Emergency Department (HOSPITAL_COMMUNITY): Payer: Medicare HMO

## 2023-12-07 DIAGNOSIS — I10 Essential (primary) hypertension: Secondary | ICD-10-CM | POA: Insufficient documentation

## 2023-12-07 DIAGNOSIS — R42 Dizziness and giddiness: Secondary | ICD-10-CM | POA: Insufficient documentation

## 2023-12-07 DIAGNOSIS — R519 Headache, unspecified: Secondary | ICD-10-CM | POA: Diagnosis not present

## 2023-12-07 DIAGNOSIS — Z5329 Procedure and treatment not carried out because of patient's decision for other reasons: Secondary | ICD-10-CM | POA: Diagnosis not present

## 2023-12-07 DIAGNOSIS — Z8673 Personal history of transient ischemic attack (TIA), and cerebral infarction without residual deficits: Secondary | ICD-10-CM | POA: Insufficient documentation

## 2023-12-07 DIAGNOSIS — E119 Type 2 diabetes mellitus without complications: Secondary | ICD-10-CM | POA: Insufficient documentation

## 2023-12-07 DIAGNOSIS — Z79899 Other long term (current) drug therapy: Secondary | ICD-10-CM | POA: Insufficient documentation

## 2023-12-07 DIAGNOSIS — I639 Cerebral infarction, unspecified: Secondary | ICD-10-CM | POA: Diagnosis not present

## 2023-12-07 DIAGNOSIS — Z7982 Long term (current) use of aspirin: Secondary | ICD-10-CM | POA: Diagnosis not present

## 2023-12-07 MED ORDER — MECLIZINE HCL 25 MG PO TABS: 25.0000 mg | ORAL_TABLET | Freq: Once | ORAL | Status: DC

## 2023-12-07 MED ORDER — HYDROMORPHONE HCL 1 MG/ML IJ SOLN: 0.5000 mg | Freq: Once | INTRAMUSCULAR | Status: DC

## 2023-12-07 NOTE — ED Provider Notes (Signed)
[place] EMERGENCY DEPARTMENT AT [facility] Provider Note   CSN: [id number] Arrival date & time: 12/07/23  1347     History  Chief Complaint  Patient presents with   Dizziness    Stefanie Carter is a 78 y.o. female.  78 year old female with history of hypertension, hyperlipidemia, diabetes, and celebrated her stroke who presents to the emergency department with headache.  Patient reports that she has been having a headache since September.  Reports that is off and on.  Worsened recently but does not recall exactly when.  Left-sided and throbbing.  Describes it as 12/10 in severity.  Says that she was discharged 2 days ago after a stroke.  Reports that since then she has had some double vision and dizziness.  Says that the dizziness worsened at 1230 pm today.  She is declining an IV and labs though she does understand that it is possible that she could have a stroke at this time.  Is on aspirin and Plavix but no other blood thinners.       Home Medications Prior to Admission medications   Medication Sig Start Date End Date Taking? Authorizing Provider  acetaminophen (TYLENOL) 325 MG tablet Take 325 mg by mouth every 6 (six) hours as needed for moderate pain (pain score 4-6).    [provider]  aspirin EC 325 MG tablet Take 1 tablet (325 mg total) by mouth daily. 12/03/23   Glade Lloyd, MD  atorvastatin (LIPITOR) 80 MG tablet Take 1 tablet (80 mg total) by mouth daily. 11/10/23   Hongalgi, Maximino Greenland, MD  Budeson-Glycopyrrol-Formoterol (BREZTRI AEROSPHERE) 160-9-4.8 MCG/ACT AERO Inhale 2 puffs into the lungs in the morning and at bedtime.    [provider]  caffeine 200 MG TABS tablet Take 200 mg by mouth daily as needed.    [provider]  clopidogrel (PLAVIX) 75 MG tablet Take 1 tablet (75 mg total) by mouth daily. Plavix and aspirin for 3 months and then aspirin alone as per Neurology 12/30/23 03/29/24  Glade Lloyd, MD  Coenzyme Q10 (CO  Q10) 100 MG CAPS Take 1 capsule by mouth daily.    [provider]  famotidine (PEPCID) 40 MG tablet Take 1 tablet (40 mg total) by mouth daily. 11/09/23   Hongalgi, Maximino Greenland, MD  gabapentin (NEURONTIN) 100 MG capsule Take 100 mg by mouth at bedtime as needed. 10/01/23   [provider]  ibuprofen (ADVIL) 200 MG tablet Take 200 mg by mouth every 6 (six) hours as needed for mild pain (pain score 1-3) or moderate pain (pain score 4-6).    [provider]  ipratropium-albuterol (DUONEB) 0.5-2.5 (3) MG/3ML SOLN Take 3 mLs by nebulization every 6 (six) hours as needed.    [provider]  levothyroxine (SYNTHROID) 125 MCG tablet Take 1 tablet (125 mcg total) by mouth daily before breakfast. 11/09/23   Hongalgi, Maximino Greenland, MD  loratadine (CLARITIN) 10 MG tablet Take 10 mg by mouth daily as needed for allergies.    [provider]  LORazepam (ATIVAN) 0.5 MG tablet Take 0.5 mg by mouth every 8 (eight) hours as needed. 04/09/23   [provider]  Magnesium 200 MG TABS Take 100 mg by mouth daily.    [provider]  meclizine (ANTIVERT) 25 MG tablet Take 1 tablet (25 mg total) by mouth 3 (three) times daily as needed for dizziness. 11/09/23   Hongalgi, Maximino Greenland, MD  MULTIPLE VITAMIN PO Take 1 tablet by mouth daily.  [provider]  Omega-3 Fatty Acids (FISH OIL PO) Take 1 capsule by mouth daily.    [provider]  pseudoephedrine (SUDAFED) 30 MG tablet Take 30 mg by mouth every 6 (six) hours as needed for congestion.    [provider]  senna (SENOKOT) 8.6 MG tablet Take 100 mg by mouth daily.    [provider]  sertraline (ZOLOFT) 100 MG tablet Take 100 mg by mouth daily. 06/11/21   [provider]  VITAMIN D, CHOLECALCIFEROL, PO Take 1 tablet by mouth daily.    [provider]  vitamin E 200 UNIT capsule Take 200 Units by mouth daily.    [provider]      Allergies    Penicillins,  Metformin hcl, Sulfa antibiotics, Sulfamethoxazole-trimethoprim, Varenicline, and Codeine    Review of Systems   Review of Systems  Physical Exam Updated Vital Signs BP (!) 167/81 (BP Location: Left Arm)   Pulse 65   Temp 97.6 F (36.4 C) (Oral)   Resp 17   SpO2 96%  Physical Exam Vitals and nursing note reviewed.  Constitutional:      General: She is not in acute distress.    Appearance: She is well-developed.  HENT:     Head: Normocephalic and atraumatic.     Right Ear: External ear normal.     Left Ear: External ear normal.     Nose: Nose normal.  Eyes:     Extraocular Movements: Extraocular movements intact.     Conjunctiva/sclera: Conjunctivae normal.     Pupils: Pupils are equal, round, and reactive to light.  Cardiovascular:     Rate and Rhythm: Normal rate and regular rhythm.     Heart sounds: No murmur heard. Pulmonary:     Effort: Pulmonary effort is normal. No respiratory distress.     Breath sounds: Normal breath sounds.  Musculoskeletal:     Cervical back: Normal range of motion and neck supple.     Right lower leg: No edema.     Left lower leg: No edema.  Skin:    General: Skin is warm and dry.  Neurological:     Mental Status: She is alert.     Comments: NIHSS Exam  Level of Consciousness: Alert  LOC Questions: Answers Month and Age Correctly  LOC Commands: Opens and Closes Eyes and Hands on command  Best Gaze: Disconjugate gaze which patient reports is at baseline.   Visual Fields: Patient unable to cooperate with his exam because of dizziness Facial Palsy: None  L Upper Extremity Motor: No drift after 10 seconds  R Upper Extremity Motor: No drift after 10 seconds  L Lower extremity Motor: No drift after 5 seconds  R Lower extremity Motor: No drift after 5 seconds  Ataxia: Difficulty with heel-to-shin Sensory: Intact sensation to light touch on face, arms, trunk, and legs bilaterally  Best Language: No aphasia  Dysarthria: No dysarthria   Neglect: No visual or sensory neglect    Psychiatric:        Mood and Affect: Mood normal.     ED Results / Procedures / Treatments   Labs (all labs ordered are listed, but only abnormal results are displayed) Labs Reviewed - No data to display   EKG None  Radiology CT Head Wo Contrast Result Date: 12/07/2023 CLINICAL DATA:  Headache.  Dizziness EXAM: CT HEAD WITHOUT CONTRAST TECHNIQUE: Contiguous axial images were obtained from the base of the skull through the vertex without intravenous contrast. RADIATION  DOSE REDUCTION: This exam was performed according to the departmental dose-optimization program which includes automated exposure control, adjustment of the mA and/or kV according to patient size and/or use of iterative reconstruction technique. COMPARISON:  MRI December 01, 2023. FINDINGS: Brain: Evolving acute/subacute left inferior cerebellar infarcts. No evidence of interval acute hemorrhage or progressive mass effect. No evidence of new/interval acute large vascular territory infarct. Patchy white matter hypodensities are compatible with chronic microvascular disease. No visible mass lesion, midline shift, or hydrocephalus. Vascular: No hyperdense vessel identified. Skull: No acute fracture. Sinuses/Orbits: Clear sinuses.  Neck overall findings. Other: No mastoid effusions. IMPRESSION: Similar evolving acute/subacute left inferior cerebellar infarcts when comparing across modalities to February 4th MRI. No evidence of acute hemorrhage or progressive mass effect. An MRI could provide more sensitive evaluation for extension of infarct if clinically warranted. Electronically Signed   By: Feliberto Harts M.D.   On: 12/07/2023 18:36    Procedures Procedures    Medications Ordered in ED Medications - No data to display   ED Course/ Medical Decision Making/ A&P Clinical Course as of 12/08/23 1433  Mon Dec 07, 2023  1727 Pt refused iv.  She was informed that without this we would  not be able to do an adequate stroke workup.  She still declines. [RP]    Clinical Course User Index [RP] Rondel Baton, MD                                 Medical Decision Making Amount and/or Complexity of Data Reviewed Labs: ordered. Radiology: ordered.  Risk Prescription drug management.   Stefanie Carter is a 78 y.o. female with comorbidities that complicate the patient evaluation including  hypertension, hyperlipidemia, diabetes, and celebrated her stroke who presents to the emergency department with headache.    Initial Ddx:  ICH, headache, stroke, vertigo  MDM/Course:  Patient presents emergency department with headache and dizziness.  Was recently diagnosed with cerebellar stroke.  Appears that she has had all of these symptoms over weeks to months.  Does report that they have worsened today.  With the unclear LKW and recent stroke is not a TNK candidate and so no code stroke was activated. However, I was concerned about possible new stroke or ICH and did explain this to the patient.  Did explain that these could be life-threatening conditions and that she could suffer permanent neurologic damage.  She expressed that she understood but only wanted a head CT today and no blood work and a shot of pain medication.  Patient was ordered for a shot of subcutaneous Dilaudid and had a head CT that did not show any bleed.  It did show evolving acute to subacute infarcts in her cerebellum and MRI was recommended.  I notified the patient of this and told her that this is the only way that we would know if she is having an acute stroke.  She states that she feels that she already knows that she is having a stroke and is refusing blood work and MRI at this time.  Patient eventually left the emergency department on her own. Do feel that she had capacity to leave at this time as she expressed multiple times that she understood the risks of doing so.  This patient presents to the ED for  concern of complaints listed in HPI, this involves an extensive number of treatment options, and is a complaint that carries with  it a high risk of complications and morbidity. Disposition including potential need for admission considered.   Dispo: AMA  Records reviewed DC Summary I independently reviewed the following imaging with scope of interpretation limited to determining acute life threatening conditions related to emergency care: CT Head and agree with the radiologist interpretation with the following exceptions: none I have reviewed the patients home medications and made adjustments as needed Social Determinants of health:  Elderly  Portions of this note were generated with Scientist, clinical (histocompatibility and immunogenetics). Dictation errors may occur despite best attempts at proofreading.     Final Clinical Impression(s) / ED Diagnoses Final diagnoses:  Dizziness  History of stroke    Rx / DC Orders ED Discharge Orders     None         Rondel Baton, MD 12/08/23 1433

## 2023-12-07 NOTE — ED Notes (Signed)
 Pt refused any blood work, relays she "just want to find out if the bleeding in her head has stopped" MD notified

## 2023-12-07 NOTE — ED Notes (Signed)
 This NT went to check pt CBG and pt started yelling at this NT. Rn notified and CBG was refused.

## 2023-12-07 NOTE — ED Triage Notes (Addendum)
 Pt from home via [facility] with reports of dizziness that started at 1230 today. Pt also reports posterior head pain. Pt reports the headache has been on and off since September.

## 2023-12-07 NOTE — ED Notes (Signed)
 Refusing labs. Informed importance of labs, pt still declines.

## 2023-12-07 NOTE — ED Provider Triage Note (Signed)
 Emergency Medicine Provider Triage Evaluation Note  Stefanie Carter , a 78 y.o. female  was evaluated in triage.  Pt complains of dizziness nausea.  Review of Systems  Positive: Vertigo Negative: Chest pain  Physical Exam  BP 135/62 (BP Location: Left Arm)   Pulse (!) 57   Temp 97.8 F (36.6 C) (Oral)   Resp 18   SpO2 98%  Gen:   Awake, no distress   Resp:  Normal effort  MSK:   Moves extremities without difficulty  Other:    Medical Decision Making  Medically screening exam initiated at 2:06 PM.  Appropriate orders placed.  Stefanie Carter was informed that the remainder of the evaluation will be completed by another provider, this initial triage assessment does not replace that evaluation, and the importance of remaining in the ED until their evaluation is complete.     Tonya Fredrickson, MD 12/07/23 [phone]

## 2023-12-08 DIAGNOSIS — R270 Ataxia, unspecified: Secondary | ICD-10-CM | POA: Diagnosis not present

## 2023-12-08 DIAGNOSIS — Z8673 Personal history of transient ischemic attack (TIA), and cerebral infarction without residual deficits: Secondary | ICD-10-CM | POA: Diagnosis not present

## 2023-12-08 DIAGNOSIS — H532 Diplopia: Secondary | ICD-10-CM | POA: Diagnosis not present

## 2023-12-08 DIAGNOSIS — M5090 Cervical disc disorder, unspecified, unspecified cervical region: Secondary | ICD-10-CM | POA: Diagnosis not present

## 2023-12-08 DIAGNOSIS — Z09 Encounter for follow-up examination after completed treatment for conditions other than malignant neoplasm: Secondary | ICD-10-CM | POA: Diagnosis not present

## 2023-12-08 DIAGNOSIS — R42 Dizziness and giddiness: Secondary | ICD-10-CM | POA: Diagnosis not present

## 2023-12-09 DIAGNOSIS — R2681 Unsteadiness on feet: Secondary | ICD-10-CM | POA: Diagnosis not present

## 2023-12-09 DIAGNOSIS — E119 Type 2 diabetes mellitus without complications: Secondary | ICD-10-CM | POA: Diagnosis not present

## 2023-12-09 DIAGNOSIS — I69393 Ataxia following cerebral infarction: Secondary | ICD-10-CM | POA: Diagnosis not present

## 2023-12-09 DIAGNOSIS — I10 Essential (primary) hypertension: Secondary | ICD-10-CM | POA: Diagnosis not present

## 2023-12-09 DIAGNOSIS — M4802 Spinal stenosis, cervical region: Secondary | ICD-10-CM | POA: Diagnosis not present

## 2023-12-09 DIAGNOSIS — L03115 Cellulitis of right lower limb: Secondary | ICD-10-CM | POA: Diagnosis not present

## 2023-12-09 DIAGNOSIS — F411 Generalized anxiety disorder: Secondary | ICD-10-CM | POA: Diagnosis not present

## 2023-12-09 DIAGNOSIS — J449 Chronic obstructive pulmonary disease, unspecified: Secondary | ICD-10-CM | POA: Diagnosis not present

## 2023-12-09 DIAGNOSIS — I69398 Other sequelae of cerebral infarction: Secondary | ICD-10-CM | POA: Diagnosis not present

## 2023-12-11 DIAGNOSIS — L03115 Cellulitis of right lower limb: Secondary | ICD-10-CM | POA: Diagnosis not present

## 2023-12-11 DIAGNOSIS — E119 Type 2 diabetes mellitus without complications: Secondary | ICD-10-CM | POA: Diagnosis not present

## 2023-12-11 DIAGNOSIS — I69398 Other sequelae of cerebral infarction: Secondary | ICD-10-CM | POA: Diagnosis not present

## 2023-12-11 DIAGNOSIS — I69393 Ataxia following cerebral infarction: Secondary | ICD-10-CM | POA: Diagnosis not present

## 2023-12-11 DIAGNOSIS — M4802 Spinal stenosis, cervical region: Secondary | ICD-10-CM | POA: Diagnosis not present

## 2023-12-11 DIAGNOSIS — I10 Essential (primary) hypertension: Secondary | ICD-10-CM | POA: Diagnosis not present

## 2023-12-11 DIAGNOSIS — F411 Generalized anxiety disorder: Secondary | ICD-10-CM | POA: Diagnosis not present

## 2023-12-11 DIAGNOSIS — J449 Chronic obstructive pulmonary disease, unspecified: Secondary | ICD-10-CM | POA: Diagnosis not present

## 2023-12-11 DIAGNOSIS — R2681 Unsteadiness on feet: Secondary | ICD-10-CM | POA: Diagnosis not present

## 2023-12-14 DIAGNOSIS — I69393 Ataxia following cerebral infarction: Secondary | ICD-10-CM | POA: Diagnosis not present

## 2023-12-14 DIAGNOSIS — R2681 Unsteadiness on feet: Secondary | ICD-10-CM | POA: Diagnosis not present

## 2023-12-14 DIAGNOSIS — I69398 Other sequelae of cerebral infarction: Secondary | ICD-10-CM | POA: Diagnosis not present

## 2023-12-14 DIAGNOSIS — F411 Generalized anxiety disorder: Secondary | ICD-10-CM | POA: Diagnosis not present

## 2023-12-14 DIAGNOSIS — I10 Essential (primary) hypertension: Secondary | ICD-10-CM | POA: Diagnosis not present

## 2023-12-14 DIAGNOSIS — J449 Chronic obstructive pulmonary disease, unspecified: Secondary | ICD-10-CM | POA: Diagnosis not present

## 2023-12-14 DIAGNOSIS — M4802 Spinal stenosis, cervical region: Secondary | ICD-10-CM | POA: Diagnosis not present

## 2023-12-14 DIAGNOSIS — L03115 Cellulitis of right lower limb: Secondary | ICD-10-CM | POA: Diagnosis not present

## 2023-12-14 DIAGNOSIS — E119 Type 2 diabetes mellitus without complications: Secondary | ICD-10-CM | POA: Diagnosis not present

## 2023-12-22 DIAGNOSIS — M4802 Spinal stenosis, cervical region: Secondary | ICD-10-CM | POA: Diagnosis not present

## 2023-12-22 DIAGNOSIS — L03115 Cellulitis of right lower limb: Secondary | ICD-10-CM | POA: Diagnosis not present

## 2023-12-22 DIAGNOSIS — R2681 Unsteadiness on feet: Secondary | ICD-10-CM | POA: Diagnosis not present

## 2023-12-22 DIAGNOSIS — E119 Type 2 diabetes mellitus without complications: Secondary | ICD-10-CM | POA: Diagnosis not present

## 2023-12-22 DIAGNOSIS — J449 Chronic obstructive pulmonary disease, unspecified: Secondary | ICD-10-CM | POA: Diagnosis not present

## 2023-12-22 DIAGNOSIS — I69393 Ataxia following cerebral infarction: Secondary | ICD-10-CM | POA: Diagnosis not present

## 2023-12-22 DIAGNOSIS — F411 Generalized anxiety disorder: Secondary | ICD-10-CM | POA: Diagnosis not present

## 2023-12-22 DIAGNOSIS — I69398 Other sequelae of cerebral infarction: Secondary | ICD-10-CM | POA: Diagnosis not present

## 2023-12-22 DIAGNOSIS — I10 Essential (primary) hypertension: Secondary | ICD-10-CM | POA: Diagnosis not present

## 2023-12-24 DIAGNOSIS — R339 Retention of urine, unspecified: Secondary | ICD-10-CM | POA: Diagnosis not present

## 2023-12-31 DIAGNOSIS — F419 Anxiety disorder, unspecified: Secondary | ICD-10-CM | POA: Diagnosis not present

## 2023-12-31 DIAGNOSIS — I10 Essential (primary) hypertension: Secondary | ICD-10-CM | POA: Diagnosis not present

## 2023-12-31 DIAGNOSIS — J449 Chronic obstructive pulmonary disease, unspecified: Secondary | ICD-10-CM | POA: Diagnosis not present

## 2023-12-31 DIAGNOSIS — Z8673 Personal history of transient ischemic attack (TIA), and cerebral infarction without residual deficits: Secondary | ICD-10-CM | POA: Diagnosis not present

## 2024-01-05 DIAGNOSIS — N3942 Incontinence without sensory awareness: Secondary | ICD-10-CM | POA: Diagnosis not present

## 2024-01-05 DIAGNOSIS — Z4689 Encounter for fitting and adjustment of other specified devices: Secondary | ICD-10-CM | POA: Diagnosis not present

## 2024-01-05 DIAGNOSIS — N816 Rectocele: Secondary | ICD-10-CM | POA: Diagnosis not present

## 2024-01-07 DIAGNOSIS — M25511 Pain in right shoulder: Secondary | ICD-10-CM | POA: Diagnosis not present

## 2024-01-07 DIAGNOSIS — M4802 Spinal stenosis, cervical region: Secondary | ICD-10-CM | POA: Diagnosis not present

## 2024-01-08 DIAGNOSIS — M4802 Spinal stenosis, cervical region: Secondary | ICD-10-CM | POA: Diagnosis not present

## 2024-01-08 DIAGNOSIS — J449 Chronic obstructive pulmonary disease, unspecified: Secondary | ICD-10-CM | POA: Diagnosis not present

## 2024-01-08 DIAGNOSIS — I69393 Ataxia following cerebral infarction: Secondary | ICD-10-CM | POA: Diagnosis not present

## 2024-01-08 DIAGNOSIS — I10 Essential (primary) hypertension: Secondary | ICD-10-CM | POA: Diagnosis not present

## 2024-01-08 DIAGNOSIS — E119 Type 2 diabetes mellitus without complications: Secondary | ICD-10-CM | POA: Diagnosis not present

## 2024-01-08 DIAGNOSIS — R2681 Unsteadiness on feet: Secondary | ICD-10-CM | POA: Diagnosis not present

## 2024-01-08 DIAGNOSIS — F411 Generalized anxiety disorder: Secondary | ICD-10-CM | POA: Diagnosis not present

## 2024-01-08 DIAGNOSIS — I69398 Other sequelae of cerebral infarction: Secondary | ICD-10-CM | POA: Diagnosis not present

## 2024-01-08 DIAGNOSIS — L03115 Cellulitis of right lower limb: Secondary | ICD-10-CM | POA: Diagnosis not present

## 2024-01-13 ENCOUNTER — Telehealth: Payer: Self-pay | Admitting: Physical Medicine and Rehabilitation

## 2024-01-13 NOTE — Telephone Encounter (Signed)
 Patient requesting an appointment from a referral she states. Please call ASAP

## 2024-01-19 DIAGNOSIS — N3942 Incontinence without sensory awareness: Secondary | ICD-10-CM | POA: Diagnosis not present

## 2024-01-19 DIAGNOSIS — N814 Uterovaginal prolapse, unspecified: Secondary | ICD-10-CM | POA: Diagnosis not present

## 2024-01-19 DIAGNOSIS — N816 Rectocele: Secondary | ICD-10-CM | POA: Diagnosis not present

## 2024-01-19 DIAGNOSIS — N8110 Cystocele, unspecified: Secondary | ICD-10-CM | POA: Diagnosis not present

## 2024-01-19 DIAGNOSIS — Z4689 Encounter for fitting and adjustment of other specified devices: Secondary | ICD-10-CM | POA: Diagnosis not present

## 2024-01-25 DIAGNOSIS — R339 Retention of urine, unspecified: Secondary | ICD-10-CM | POA: Diagnosis not present

## 2024-01-28 ENCOUNTER — Ambulatory Visit: Admitting: Physical Medicine and Rehabilitation

## 2024-01-28 ENCOUNTER — Encounter: Payer: Self-pay | Admitting: Physical Medicine and Rehabilitation

## 2024-01-28 DIAGNOSIS — R269 Unspecified abnormalities of gait and mobility: Secondary | ICD-10-CM

## 2024-01-28 DIAGNOSIS — M5442 Lumbago with sciatica, left side: Secondary | ICD-10-CM

## 2024-01-28 DIAGNOSIS — M48062 Spinal stenosis, lumbar region with neurogenic claudication: Secondary | ICD-10-CM

## 2024-01-28 DIAGNOSIS — G894 Chronic pain syndrome: Secondary | ICD-10-CM

## 2024-01-28 DIAGNOSIS — M5416 Radiculopathy, lumbar region: Secondary | ICD-10-CM

## 2024-01-28 DIAGNOSIS — G8929 Other chronic pain: Secondary | ICD-10-CM

## 2024-01-28 DIAGNOSIS — M4316 Spondylolisthesis, lumbar region: Secondary | ICD-10-CM | POA: Diagnosis not present

## 2024-01-28 DIAGNOSIS — M5441 Lumbago with sciatica, right side: Secondary | ICD-10-CM | POA: Diagnosis not present

## 2024-01-28 NOTE — Progress Notes (Signed)
 Pain Scale   Average Pain 7        +Driver, -BT, -Dye Allergies.

## 2024-01-28 NOTE — Progress Notes (Addendum)
 Stefanie Carter - 78 y.o. female MRN [id number]  Date of birth: 08-May-1946  Office Visit Note: Visit Date: 01/28/2024 PCP: Georgann Housekeeper, MD Referred by: Georgann Housekeeper, MD  Subjective: Chief Complaint  Patient presents with   Lower Back - Pain   HPI: Stefanie Carter is a 78 y.o. female who comes in today for evaluation of chronic, worsening and severe bilateral lower back pain radiating to buttocks and down lateral legs to feet. Numbness and tingling to bilateral legs. Pain ongoing for several years. She was previous patient of Dr. Annell Greening. Her pain worsens with prolonged standing and walking. She describes her pain as sore, aching and tingling, currently rates as 8 out of 10. Some relief of pain with physician directed home exercise regimen, rest and use of medications. Lumbar MRI imaging from 2024 shows multilevel lumbar spondylosis, worst at L4-L5, where there is severe spinal canal stenosis and severe bilateral neural foraminal narrowing. She underwent bilateral L4 transforaminal epidural steroid injections in our office on 06/03/2023, she reports greater than 50% relief of pain for 3 or more months. Unfortunately, she suffered from cerebellar stroke in January that has significantly impacted her walking and balance. She is using cane to assist with ambulation. She has upcoming appointment with Dr. Delia Heady with [facility] on 02/09/2024. Patient denies recent trauma or falls.   Patients course is complicated by cerebellar stroke, hypertension, diabetes mellitus, COPD and anxiety. She is currently taking Plavix.  Of note, history of right C7-T1 interlaminar epidural steroid injection at [facility] in December of 2024.      Review of Systems  Musculoskeletal:  Positive for back pain.  Neurological:  Positive for tingling. Negative for sensory change, focal weakness and weakness.  All other systems reviewed and are negative.  Otherwise per  HPI.  Assessment & Plan: Visit Diagnoses:    ICD-10-CM   1. Lumbar radiculopathy  M54.16     2. Chronic bilateral low back pain with bilateral sciatica  M54.42 Ambulatory referral to Physical Medicine Rehab   M54.41    G89.29     3. Spinal stenosis of lumbar region with neurogenic claudication  M48.062 Ambulatory referral to Physical Medicine Rehab    4. Spondylolisthesis of lumbar region  M43.16 Ambulatory referral to Physical Medicine Rehab    5. Chronic pain syndrome  G89.4 Ambulatory referral to Physical Medicine Rehab    6. Gait abnormality  R26.9 Ambulatory referral to Physical Medicine Rehab       Plan: Findings:  Chronic, worsening and severe bilateral lower back pain radiating to buttocks and down lateral legs to feet. Patient continues to have severe pain despite good conservative therapies such as home exercise regimen, rest and use of medications. Patients clinical presentation and exam are consistent with neurogenic claudication as a result of spinal canal stenosis. There is severe multifactorial spinal canal stenosis at the level of L4-L5 on recent lumbar MRI imaging. I explained to her that I believe her central stenosis is causing her symptoms. She has done well with lumbar epidural steroid injections in the past. I discussed treatment plan with her in detail today. Next step is to place order for bilateral L4 transforaminal epidural steroid injections under fluoroscopic guidance. She has no questions regarding injection procedure. She would like to get in for injection quickly if possible. I reassured her that we would get her in as quick as possible pending insurance approval. Should her pain persist post injections I do feel she would be  a good candidate for chronic pain management. She has no questions at this time. No red flag symptoms noted upon exam today.   Of note, patient continues with physician directed home exercise regimen, rest and use of medications. Her severe  pain is limiting functional ability. Her primary care provider has been sent a copy of our office notes and is aware our plan for infrequent lumbar epidural steroid injections.     Meds & Orders: No orders of the defined types were placed in this encounter.   Orders Placed This Encounter  Procedures   Ambulatory referral to Physical Medicine Rehab    Follow-up: Return for Bilateral L4 transforaminal epidural steroid injection.   Procedures: No procedures performed      Clinical History: CLINICAL DATA:  Lumbar degenerative disc disease. Back pain radiating to the legs.   EXAM: MRI LUMBAR SPINE WITHOUT CONTRAST   TECHNIQUE: Multiplanar, multisequence MR imaging of the lumbar spine was performed. No intravenous contrast was administered.   COMPARISON:  Lumbar spine MRI 08/03/2020.   FINDINGS: Segmentation: Conventional numbering is assumed with 5 non-rib-bearing, lumbar type vertebral bodies.   Alignment:  Unchanged grade 1 anterolisthesis of L4 on L5.   Vertebrae: Normal vertebral body heights. Multilevel degenerative endplate marrow signal changes with Modic type 1 marrow edema at L1-2.   Conus medullaris and cauda equina: Conus extends to the L1 level. Conus and cauda equina appear normal.   Paraspinal and other soft tissues: Moderate fatty atrophy of the paraspinal muscles.   Disc levels:   T12-L1: Disc bulge and facet arthropathy results in moderate bilateral neural foraminal narrowing.   L1-L2: Disc bulge and ligamentum flavum buckling results in mild-to-moderate spinal canal stenosis. Facet arthropathy contributes to severe left and moderate right neural foraminal narrowing.   L2-L3: Disc bulge and facet arthropathy results in moderate spinal canal stenosis, severe left and moderate right neural foraminal narrowing.   L3-L4: Disc bulge and facet arthropathy contribute to moderate bilateral neural foraminal narrowing.   L4-L5: Anterolisthesis with  uncovered disc and ligamentum flavum buckling results in severe spinal canal stenosis. Facet arthropathy contributes to severe bilateral neural foraminal narrowing.   L5-S1:  Normal.   IMPRESSION: 1. Multilevel lumbar spondylosis, worst at L4-5, where there is severe spinal canal stenosis and severe bilateral neural foraminal narrowing. 2. Moderate spinal canal stenosis at L2-3 and mild-to-moderate spinal canal stenosis at L1-2. 3. Multilevel moderate to severe neural foraminal narrowing, as detailed above.     Electronically Signed   By: Orvan Falconer M.D.   On: 08/27/2023 10:42   She reports that she quit smoking about 12 years ago. Her smoking use included cigarettes. She started smoking about 42 years ago. She has a 30 pack-year smoking history. She has never used smokeless tobacco.  Recent Labs    11/08/23 0648  HGBA1C 6.5*    Objective:  VS:  HT:    WT:   BMI:     BP:   HR: bpm  TEMP: ( )  RESP:  Physical Exam Vitals and nursing note reviewed.  HENT:     Head: Normocephalic and atraumatic.     Right Ear: External ear normal.     Left Ear: External ear normal.     Nose: Nose normal.     Mouth/Throat:     Mouth: Mucous membranes are moist.  Eyes:     Extraocular Movements: Extraocular movements intact.  Cardiovascular:     Rate and Rhythm: Normal rate.  Pulses: Normal pulses.  Pulmonary:     Effort: Pulmonary effort is normal.  Abdominal:     General: Abdomen is flat. There is no distension.  Musculoskeletal:        General: Tenderness present.     Cervical back: Normal range of motion.     Comments: Patient is slow to rise from seated position to standing. Good lumbar range of motion. No pain noted with facet loading. 5/5 strength noted with bilateral hip flexion, knee flexion/extension, ankle dorsiflexion/plantarflexion and EHL. No clonus noted bilaterally. No pain upon palpation of greater trochanters. No pain with internal/external rotation of  bilateral hips. Sensation intact bilaterally. Negative slump test bilaterally. Ambulates with cane, gait slow and unsteady.   Skin:    General: Skin is warm and dry.     Capillary Refill: Capillary refill takes less than 2 seconds.  Neurological:     General: No focal deficit present.     Mental Status: She is alert and oriented to person, place, and time.     Ortho Exam  Imaging: No results found.  Past Medical/Family/Surgical/Social History: Medications & Allergies reviewed per EMR, new medications updated. Patient Active Problem List   Diagnosis Date Noted   Cerebellar stroke (HCC) 12/01/2023   Vertigo 11/08/2023   Cellulitis of right lower extremity 11/08/2023   DM2 (diabetes mellitus, type 2) (HCC) 11/08/2023   GAD (generalized anxiety disorder) 11/08/2023   Acquired hypothyroidism 11/08/2023   History of COPD 11/08/2023   Acute ischemic stroke (HCC) 11/07/2023   Spinal stenosis 06/20/2013   HLD (hyperlipidemia) 06/20/2013   Essential hypertension 06/20/2013   Anxiety 06/20/2013   Past Medical History:  Diagnosis Date   Anxiety    Diabetes (HCC)    HLD (hyperlipidemia)    HTN (hypertension)    Hypothyroid    Spinal stenosis    Family History  Problem Relation Age of Onset   Heart disease Mother    COPD Mother    Kidney disease Father    Depression Father    Glaucoma Father    Past Surgical History:  Procedure Laterality Date   APPENDECTOMY     SALIVARY GLAND SURGERY     TONSILLECTOMY     Social History   Occupational History   Not on file  Tobacco Use   Smoking status: Former    Current packs/day: 0.00    Average packs/day: 1 pack/day for 30.0 years (30.0 ttl pk-yrs)    Types: Cigarettes    Start date: 11/03/1981    Quit date: 11/04/2011    Years since quitting: 12.2   Smokeless tobacco: Never  Vaping Use   Vaping status: Never Used  Substance and Sexual Activity   Alcohol use: Yes    Comment: socially   Drug use: Never   Sexual activity: Not  Currently

## 2024-01-30 DIAGNOSIS — Z8673 Personal history of transient ischemic attack (TIA), and cerebral infarction without residual deficits: Secondary | ICD-10-CM | POA: Diagnosis not present

## 2024-01-30 DIAGNOSIS — D6869 Other thrombophilia: Secondary | ICD-10-CM | POA: Insufficient documentation

## 2024-01-30 DIAGNOSIS — M791 Myalgia, unspecified site: Secondary | ICD-10-CM | POA: Insufficient documentation

## 2024-01-30 DIAGNOSIS — M069 Rheumatoid arthritis, unspecified: Secondary | ICD-10-CM | POA: Diagnosis not present

## 2024-02-01 ENCOUNTER — Telehealth: Payer: Self-pay | Admitting: Physical Medicine and Rehabilitation

## 2024-02-01 NOTE — Telephone Encounter (Signed)
 Patient called and said she was approved for the shot and can she get an appointment because its and emergency. [phone]

## 2024-02-09 ENCOUNTER — Encounter: Payer: Self-pay | Admitting: Neurology

## 2024-02-09 ENCOUNTER — Ambulatory Visit (INDEPENDENT_AMBULATORY_CARE_PROVIDER_SITE_OTHER): Payer: Medicare HMO | Admitting: Neurology

## 2024-02-09 VITALS — BP 127/62 | HR 70 | Ht 60.0 in | Wt 119.3 lb

## 2024-02-09 DIAGNOSIS — R7309 Other abnormal glucose: Secondary | ICD-10-CM | POA: Diagnosis not present

## 2024-02-09 DIAGNOSIS — R42 Dizziness and giddiness: Secondary | ICD-10-CM | POA: Diagnosis not present

## 2024-02-09 DIAGNOSIS — I639 Cerebral infarction, unspecified: Secondary | ICD-10-CM | POA: Diagnosis not present

## 2024-02-09 DIAGNOSIS — I6502 Occlusion and stenosis of left vertebral artery: Secondary | ICD-10-CM

## 2024-02-09 DIAGNOSIS — E7800 Pure hypercholesterolemia, unspecified: Secondary | ICD-10-CM | POA: Diagnosis not present

## 2024-02-09 NOTE — Patient Instructions (Signed)
 I had a long d/w patient about her  recent  cerebellar strokes and left vertebral artery occlusion, risk for recurrent stroke/TIAs, personally independently reviewed imaging studies and stroke evaluation results and answered questions.Continue aspirin 81 mg daily and clopidogrel 75 mg daily  for secondary stroke prevention for 1 more month and then discontinue aspirin and stay on Plavix alone and maintain strict control of hypertension with blood pressure goal below 130/90, diabetes with hemoglobin A1c goal below 6.5% and lipids with LDL cholesterol goal below 70 mg/dL. I also advised the patient to eat a healthy diet with plenty of whole grains, cereals, fruits and vegetables, exercise regularly and maintain ideal body weight.  Check lipid profile and hemoglobin A1c today.  Followup in the future with me in 6 months or call earlier if necessary . Stroke Prevention Some medical conditions and behaviors can lead to a higher chance of having a stroke. You can help prevent a stroke by eating healthy, exercising, not smoking, and managing any medical conditions you have. Stroke is a leading cause of functional impairment. Primary prevention is particularly important because a majority of strokes are first-time events. Stroke changes the lives of not only those who experience a stroke but also their family and other caregivers. How can this condition affect me? A stroke is a medical emergency and should be treated right away. A stroke can lead to brain damage and can sometimes be life-threatening. If a person gets medical treatment right away, there is a better chance of surviving and recovering from a stroke. What can increase my risk? The following medical conditions may increase your risk of a stroke: Cardiovascular disease. High blood pressure (hypertension). Diabetes. High cholesterol. Sickle cell disease. Blood clotting disorders (hypercoagulable state). Obesity. Sleep disorders (obstructive sleep  apnea). Other risk factors include: Being older than age 77. Having a history of blood clots, stroke, or mini-stroke (transient ischemic attack, TIA). Genetic factors, such as race, ethnicity, or a family history of stroke. Smoking cigarettes or using other tobacco products. Taking birth control pills, especially if you also use tobacco. Heavy use of alcohol or drugs, especially cocaine and methamphetamine. Physical inactivity. What actions can I take to prevent this? Manage your health conditions High cholesterol levels. Eating a healthy diet is important for preventing high cholesterol. If cholesterol cannot be managed through diet alone, you may need to take medicines. Take any prescribed medicines to control your cholesterol as told by your health care provider. Hypertension. To reduce your risk of stroke, try to keep your blood pressure below 130/80. Eating a healthy diet and exercising regularly are important for controlling blood pressure. If these steps are not enough to manage your blood pressure, you may need to take medicines. Take any prescribed medicines to control hypertension as told by your health care provider. Ask your health care provider if you should monitor your blood pressure at home. Have your blood pressure checked every year, even if your blood pressure is normal. Blood pressure increases with age and some medical conditions. Diabetes. Eating a healthy diet and exercising regularly are important parts of managing your blood sugar (glucose). If your blood sugar cannot be managed through diet and exercise, you may need to take medicines. Take any prescribed medicines to control your diabetes as told by your health care provider. Get evaluated for obstructive sleep apnea. Talk to your health care provider about getting a sleep evaluation if you snore a lot or have excessive sleepiness. Make sure that any other medical conditions  you have, such as atrial fibrillation or  atherosclerosis, are managed. Nutrition Follow instructions from your health care provider about what to eat or drink to help manage your health condition. These instructions may include: Reducing your daily calorie intake. Limiting how much salt (sodium) you use to 1,500 milligrams (mg) each day. Using only healthy fats for cooking, such as olive oil, canola oil, or sunflower oil. Eating healthy foods. You can do this by: Choosing foods that are high in fiber, such as whole grains, and fresh fruits and vegetables. Eating at least 5 servings of fruits and vegetables a day. Try to fill one-half of your plate with fruits and vegetables at each meal. Choosing lean protein foods, such as lean cuts of meat, poultry without skin, fish, tofu, beans, and nuts. Eating low-fat dairy products. Avoiding foods that are high in sodium. This can help lower blood pressure. Avoiding foods that have saturated fat, trans fat, and cholesterol. This can help prevent high cholesterol. Avoiding processed and prepared foods. Counting your daily carbohydrate intake.  Lifestyle If you drink alcohol: Limit how much you have to: 0-1 drink a day for women who are not pregnant. 0-2 drinks a day for men. Know how much alcohol is in your drink. In the [place] one drink equals one 12 oz bottle of beer ( ), one 5 oz glass of wine ( ), or one 1 oz glass of hard liquor (44mL). Do not use any products that contain nicotine or tobacco. These products include cigarettes, chewing tobacco, and vaping devices, such as e-cigarettes. If you need help quitting, ask your health care provider. Avoid secondhand smoke. Do not use drugs. Activity  Try to stay at a healthy weight. Get at least 30 minutes of exercise on most days, such as: Fast walking. Biking. Swimming. Medicines Take over-the-counter and prescription medicines only as told by your health care provider. Aspirin or blood thinners (antiplatelets or  anticoagulants) may be recommended to reduce your risk of forming blood clots that can lead to stroke. Avoid taking birth control pills. Talk to your health care provider about the risks of taking birth control pills if: You are over 34 years old. You smoke. You get very bad headaches. You have had a blood clot. Where to find more information American Stroke Association: www.strokeassociation.org Get help right away if: You or a loved one has any symptoms of a stroke. "BE FAST" is an easy way to remember the main warning signs of a stroke: B - Balance. Signs are dizziness, sudden trouble walking, or loss of balance. E - Eyes. Signs are trouble seeing or a sudden change in vision. F - Face. Signs are sudden weakness or numbness of the face, or the face or eyelid drooping on one side. A - Arms. Signs are weakness or numbness in an arm. This happens suddenly and usually on one side of the body. S - Speech. Signs are sudden trouble speaking, slurred speech, or trouble understanding what people say. T - Time. Time to call emergency services. Write down what time symptoms started. You or a loved one has other signs of a stroke, such as: A sudden, severe headache with no known cause. Nausea or vomiting. Seizure. These symptoms may represent a serious problem that is an emergency. Do not wait to see if the symptoms will go away. Get medical help right away. Call your local emergency services (911 in the [place] Do not drive yourself to the hospital. Summary You can help to prevent a stroke by  eating healthy, exercising, not smoking, limiting alcohol intake, and managing any medical conditions you may have. Do not use any products that contain nicotine or tobacco. These include cigarettes, chewing tobacco, and vaping devices, such as e-cigarettes. If you need help quitting, ask your health care provider. Remember "BE FAST" for warning signs of a stroke. Get help right away if you or a loved one has any  of these signs. This information is not intended to replace advice given to you by your health care provider. Make sure you discuss any questions you have with your health care provider. Document Revised: 09/15/2022 Document Reviewed: 09/15/2022 Elsevier Patient Education  2024 [employer].

## 2024-02-09 NOTE — Progress Notes (Signed)
 [facility] [address] [place]. [place] [address] [phone]       OFFICE FOLLOW-UP NOTE  [name] Date of Birth:  08/23/1946 Medical Record Number:  [id number]   HPI: Stefanie Carter is a pleasant 78 year old Caucasian lady seen today for initial office follow-up visit following hospital consultation for stroke.  History is obtained from the patient and review of electronic medical records.  I personally reviewed pertinent available imaging films in PACS.  She has past medical history for hypertension, diabetes, anxiety, cervical degenerative spine disease, former smoker, COPD.  She was initially admitted on 11/07/2023 for sudden onset of dizziness nausea vomiting and a fall.  CT scan at that time showed a left cerebellar small infarct and CT angiogram of the head and neck showed left V4 occlusion.  MRI showed left cerebellar infarct with involvement of left dorsal medulla as well.  EF on echocardiogram was 60 to 65%.  LDL cholesterol 88 mg percent.  Hemoglobin A1c was 6.5.  She was discharged on aspirin and Plavix and Lipitor.  She returned on 12/01/2023 with sudden onset of feeling of dizziness and nausea vomiting and was found to have left upper extremity ataxia and CT scan concerning for new superior left cerebellar small infarct.  MRI scan showed extension of the left cerebellar infarct involving superior cerebellar artery but no hemorrhagic transformation or mass effect.  Patient could not afford Brilinta and hence aspirin and Plavix was continued.  She was discharged with home health physical Occupational Therapy.  She returned to the ER at 12/07/2023 first worsening headaches.  Noncontrast CT showed no acute abnormality and evolution of the recent subacute cerebellar infarcts.  MRI was recommended but patient refused further evaluation and after headache improved with IV Dilaudid she left the ER on her own.  Patient states she is doing well now headaches  are resolved.  She gets dizzy only occasionally but is overall much better.  She is tolerating aspirin and Plavix but bruises easily.  She is tolerating Lipitor well without side effects.  She has quit smoking completely.  She is trying to eat healthy but has not lost any weight.  She has not been started on any diabetic medication but she states she does not like her primary care physician thinking about switching to a new one.  They with history of tachycardia  ROS:   14 system review of systems is positive for dizziness, imbalance, bruising all other systems negative  PMH:  Past Medical History:  Diagnosis Date   Anxiety    Diabetes (HCC)    HLD (hyperlipidemia)    HTN (hypertension)    Hypothyroid    Spinal stenosis     Social History:  Social History   Socioeconomic History   Marital status: Widowed    Spouse name: Not on file   Number of children: Not on file   Years of education: Not on file   Highest education level: Not on file  Occupational History   Not on file  Tobacco Use   Smoking status: Former    Current packs/day: 0.00    Average packs/day: 1 pack/day for 30.0 years (30.0 ttl pk-yrs)    Types: Cigarettes    Start date: 11/03/1981    Quit date: 11/04/2011    Years since quitting: 12.2   Smokeless tobacco: Never  Vaping Use   Vaping status: Never Used  Substance and Sexual Activity   Alcohol use: Yes    Comment: socially  Drug use: Never   Sexual activity: Not Currently  Other Topics Concern   Not on file  Social History Narrative   Not on file   Social Drivers of Health   Financial Resource Strain: Not on file  Food Insecurity: No Food Insecurity (12/01/2023)   Hunger Vital Sign    Worried About Running Out of Food in the Last Year: Never true    Ran Out of Food in the Last Year: Never true  Transportation Needs: No Transportation Needs (12/01/2023)   PRAPARE - Administrator, Civil Service (Medical): No    Lack of Transportation  (Non-Medical): No  Physical Activity: Not on file  Stress: Not on file  Social Connections: Unknown (12/01/2023)   Social Connection and Isolation Panel [NHANES]    Frequency of Communication with Friends and Family: Never    Frequency of Social Gatherings with Friends and Family: Never    Attends Religious Services: Never    Database administrator or Organizations: No    Attends Engineer, structural: Never    Marital Status: Patient declined  Recent Concern: Social Connections - Socially Isolated (11/08/2023)   Social Connection and Isolation Panel [NHANES]    Frequency of Communication with Friends and Family: Never    Frequency of Social Gatherings with Friends and Family: Twice a week    Attends Religious Services: 1 to 4 times per year    Active Member of [employer] or Organizations: No    Attends Banker Meetings: Never    Marital Status: Never married  Intimate Partner Violence: Not At Risk (12/01/2023)   Humiliation, Afraid, Rape, and Kick questionnaire    Fear of Current or Ex-Partner: No    Emotionally Abused: No    Physically Abused: No    Sexually Abused: No    Medications:   Current Outpatient Medications on File Prior to Visit  Medication Sig Dispense Refill   acetaminophen (TYLENOL) 325 MG tablet Take 325 mg by mouth every 6 (six) hours as needed for moderate pain (pain score 4-6).     aspirin EC 325 MG tablet Take 1 tablet (325 mg total) by mouth daily. 90 tablet 0   atorvastatin (LIPITOR) 80 MG tablet Take 1 tablet (80 mg total) by mouth daily. 30 tablet 1   Budeson-Glycopyrrol-Formoterol (BREZTRI AEROSPHERE) 160-9-4.8 MCG/ACT AERO Inhale 2 puffs into the lungs in the morning and at bedtime.     caffeine 200 MG TABS tablet Take 200 mg by mouth daily as needed.     clopidogrel (PLAVIX) 75 MG tablet Take 1 tablet (75 mg total) by mouth daily. Plavix and aspirin for 3 months and then aspirin alone as per Neurology 90 tablet 0   Coenzyme Q10 (CO Q10) 100  MG CAPS Take 1 capsule by mouth daily.     famotidine (PEPCID) 40 MG tablet Take 1 tablet (40 mg total) by mouth daily.     gabapentin (NEURONTIN) 100 MG capsule Take 100 mg by mouth at bedtime as needed.     ibuprofen (ADVIL) 200 MG tablet Take 200 mg by mouth every 6 (six) hours as needed for mild pain (pain score 1-3) or moderate pain (pain score 4-6).     ipratropium-albuterol (DUONEB) 0.5-2.5 (3) MG/3ML SOLN Take 3 mLs by nebulization every 6 (six) hours as needed.     levothyroxine (SYNTHROID) 125 MCG tablet Take 1 tablet (125 mcg total) by mouth daily before breakfast.     loratadine (CLARITIN) 10 MG  tablet Take 10 mg by mouth daily as needed for allergies.     LORazepam (ATIVAN) 0.5 MG tablet Take 0.5 mg by mouth every 8 (eight) hours as needed.     Magnesium 200 MG TABS Take 100 mg by mouth daily.     meclizine (ANTIVERT) 25 MG tablet Take 1 tablet (25 mg total) by mouth 3 (three) times daily as needed for dizziness. 30 tablet 0   MULTIPLE VITAMIN PO Take 1 tablet by mouth daily.     Omega-3 Fatty Acids (FISH OIL PO) Take 1 capsule by mouth daily.     pseudoephedrine (SUDAFED) 30 MG tablet Take 30 mg by mouth every 6 (six) hours as needed for congestion.     senna (SENOKOT) 8.6 MG tablet Take 100 mg by mouth daily.     sertraline (ZOLOFT) 100 MG tablet Take 100 mg by mouth daily.     VITAMIN D, CHOLECALCIFEROL, PO Take 1 tablet by mouth daily.     vitamin E 200 UNIT capsule Take 200 Units by mouth daily.     No current facility-administered medications on file prior to visit.    Allergies:   Allergies  Allergen Reactions   Penicillins Anaphylaxis   Metformin Hcl Other (See Comments)   Sulfa Antibiotics     Had as a baby; doesn't know reaction.   Sulfamethoxazole-Trimethoprim Other (See Comments)    Other reaction(s): Unknown   Varenicline Other (See Comments)    Other reaction(s): intol    Physical Exam General: Frail petite elderly Caucasian lady seated, in no evident  distress Head: head normocephalic and atraumatic.  Neck: supple with no carotid or supraclavicular bruits Cardiovascular: regular rate and rhythm, no murmurs Musculoskeletal: Marked kyphoscoliosis Skin:  no rash but multiple scattered forearm petichiae Vascular:  Normal pulses all extremities Vitals:   02/09/24 1117  BP: 127/62  Pulse: 70   Neurologic Exam Mental Status: Awake and fully alert. Oriented to place and time. Recent and remote memory intact. Attention span, concentration and fund of knowledge appropriate. Mood and affect appropriate.  Cranial Nerves: Fundoscopic exam reveals sharp disc margins. Pupils equal, briskly reactive to light. Extraocular movements full without nystagmus. Visual fields full to confrontation. Hearing intact. Facial sensation intact. Face, tongue, palate moves normally and symmetrically.  Motor: Normal bulk and tone. Normal strength in all tested extremity muscles. Sensory.: intact to touch ,pinprick .position and vibratory sensation.  Coordination: Rapid alternating movements normal in all extremities. Finger-to-nose and heel-to-shin performed accurately bilaterally. Gait and Station: Arises from chair without difficulty. Stance is normal. Gait demonstrates broad-based and slight imbalance.  Unable to perform tandem walking  Reflexes: 1+ and symmetric. Toes downgoing.   NIHSS  0 Modified Rankin  1   ASSESSMENT: 78 year old Caucasian lady with left cerebellar infarct in January 2025 with slight extension in February 2025 secondary to terminal left vertebral artery occlusion likely from small vessel disease.  Vascular risk factors of smoking, hypertension hyperlipidemia and borderline diabetes     PLAN:I had a long d/w patient about her  recent  cerebellar strokes and left vertebral artery occlusion, risk for recurrent stroke/TIAs, personally independently reviewed imaging studies and stroke evaluation results and answered questions.Continue aspirin 81  mg daily and clopidogrel 75 mg daily  for secondary stroke prevention for 1 more month and then discontinue aspirin and stay on Plavix alone and maintain strict control of hypertension with blood pressure goal below 130/90, diabetes with hemoglobin A1c goal below 6.5% and lipids with LDL cholesterol goal below  70 mg/dL. I also advised the patient to eat a healthy diet with plenty of whole grains, cereals, fruits and vegetables, exercise regularly and maintain ideal body weight.  Check lipid profile and hemoglobin A1c today.  Followup in the future with me in 6 months or call earlier if necessary  Greater than 50% of time during this 35 minute visit was spent on counseling,explanation of diagnosis, planning of further management, discussion with patient and family and coordination of care Ardella Beaver MD Note: This document was prepared with digital dictation and possible smart phrase technology. Any transcriptional errors that result from this process are unintentional

## 2024-02-10 LAB — LIPID PANEL
Chol/HDL Ratio: 2 ratio (ref 0.0–4.4)
Cholesterol, Total: 186 mg/dL (ref 100–199)
HDL: 91 mg/dL (ref >39)
LDL Chol Calc (NIH): 85 mg/dL (ref 0–99)
Triglycerides: 53 mg/dL (ref 0–149)
VLDL Cholesterol Cal: 10 mg/dL (ref 5–40)

## 2024-02-10 LAB — HEMOGLOBIN A1C
Est. average glucose Bld gHb Est-mCnc: 131 mg/dL
Hgb A1c MFr Bld: 6.2 % — ABNORMAL HIGH (ref 4.8–5.6)

## 2024-02-11 ENCOUNTER — Telehealth: Payer: Self-pay | Admitting: Neurology

## 2024-02-11 NOTE — Telephone Encounter (Signed)
 Pt called wanting to know if her lab work results have come in. Pt is is needing to know what her A1c is. Please advise.

## 2024-02-11 NOTE — Telephone Encounter (Addendum)
 Stefanie Carter called and LVM letting them know results not back yet and will call once back.

## 2024-02-14 NOTE — Progress Notes (Signed)
 Kindly inform the patient that screening test for diabetes is borderline but acceptable.  Cholesterol profile is mostly okay except bad cholesterol is still high.  Since patient is on maximum dose of statin I would recommend adding Repatha or Praluent injections if patient is willing I can prescribe it

## 2024-02-15 ENCOUNTER — Ambulatory Visit: Admitting: Physical Medicine and Rehabilitation

## 2024-02-15 ENCOUNTER — Other Ambulatory Visit: Payer: Self-pay

## 2024-02-15 VITALS — BP 123/70 | HR 81

## 2024-02-15 DIAGNOSIS — M4802 Spinal stenosis, cervical region: Secondary | ICD-10-CM | POA: Diagnosis not present

## 2024-02-15 DIAGNOSIS — I639 Cerebral infarction, unspecified: Secondary | ICD-10-CM

## 2024-02-15 DIAGNOSIS — M542 Cervicalgia: Secondary | ICD-10-CM

## 2024-02-15 DIAGNOSIS — M5416 Radiculopathy, lumbar region: Secondary | ICD-10-CM | POA: Diagnosis not present

## 2024-02-15 DIAGNOSIS — G894 Chronic pain syndrome: Secondary | ICD-10-CM | POA: Diagnosis not present

## 2024-02-15 DIAGNOSIS — M48062 Spinal stenosis, lumbar region with neurogenic claudication: Secondary | ICD-10-CM

## 2024-02-15 MED ORDER — METHYLPREDNISOLONE ACETATE 40 MG/ML IJ SUSP
40.0000 mg | Freq: Once | INTRAMUSCULAR | Status: AC
Start: 2024-02-15 — End: 2024-02-15
  Administered 2024-02-15: 40 mg

## 2024-02-15 NOTE — Patient Instructions (Signed)

## 2024-02-15 NOTE — Progress Notes (Signed)
 Pain Scale   Average Pain 9 Patient advising she is in constant pain with no releif        +Driver, -BT, -Dye Allergies.

## 2024-02-15 NOTE — Progress Notes (Signed)
 Stefanie Carter - 78 y.o. female MRN [id number]  Date of birth: [birth year]  Office Visit Note: Visit Date: 02/15/2024 PCP: Jearldine Mina, MD Referred by: Jearldine Mina, MD  Subjective: Chief Complaint  Patient presents with   Lower Back - Pain   HPI: Stefanie Carter is a 78 y.o. female who comes in today for 2 reasons today, #1 being planned a bilateral L4 transforaminal injection and #2 evaluation of neck pain.  In terms of the lumbar injection she has known severe stenosis is responded in the past epidural injection has not had this in quite some time and did fairly well with it.  Unfortunately since have seen her last she has had 3 cerebellar strokes and does have an appointment with neurology coming up on the 15th.  She is on Plavix .  Interestingly even just 2 days after seeing us  in the office she saw Dr. Adelaide Adjutant at [facility] for evaluation management of neck pain.  She does get severe neck pain worse with rotation essentially present 24 hours a day.  Significantly worse since having had the stroke but has had continued neck pain for some time.  She reports that Dr. Rexanne Catalina said that she should contact us  for further management since we are seeing her for her lower back.  She does have ataxia but no focal weakness.  She actually did have cervical epidural injection in 2024 at [facility] imaging.  This was directed by her primary care physician Dr. Hussain.  She does have rheumatoid arthritis and I think this is likely is the reason behind the C1-2 pannus formation and moderate stenosis.  She has multilevel foraminal narrowing.   I spent more than 30 minutes speaking face-to-face with the patient with 50% of the time in counseling and discussing coordination of care.      Review of Systems  Musculoskeletal:  Positive for back pain, joint pain and neck pain.  Neurological:  Positive for dizziness, tingling and focal weakness.  All other systems reviewed and are  negative.  Otherwise per HPI.  Assessment & Plan: Visit Diagnoses:    ICD-10-CM   1. Lumbar radiculopathy  M54.16 XR C-ARM NO REPORT    Epidural Steroid injection    methylPREDNISolone  acetate (DEPO-MEDROL ) injection 40 mg    2. Spinal stenosis of lumbar region with neurogenic claudication  M48.062     3. Spinal stenosis of cervical region  M48.02 Ambulatory referral to Physical Medicine Rehab    4. Cervicalgia  M54.2 Ambulatory referral to Physical Medicine Rehab    5. Chronic pain syndrome  G89.4 Ambulatory referral to Physical Medicine Rehab    6. Cerebellar stroke [facility])  I63.9 Ambulatory referral to Physical Medicine Rehab       Plan: Findings:  1.  Lumbar stenosis with low back pain and bilateral radicular leg pain.  Will complete bilateral L4 transforaminal injection today.  She will follow-up as needed.  2.  In terms of her neck pain we would need to see her in a full office visit post her seeing the neurologist.  Likely not much we could do with her on the Plavix .  Could look at potential trigger point injection versus medial branch blocks for more axial pain.  Cannot really do much about the cervical stenosis.  She may have to look at surgical treatment although I doubt she would be a great candidate with the ongoing issue with the stroke and her health issues in general.    Meds & Orders:  Meds ordered this encounter  Medications   methylPREDNISolone  acetate (DEPO-MEDROL ) injection 40 mg    Orders Placed This Encounter  Procedures   XR C-ARM NO REPORT   Ambulatory referral to Physical Medicine Rehab   Epidural Steroid injection    Follow-up: Return if symptoms worsen or fail to improve.   Procedures: No procedures performed  Lumbosacral Transforaminal Epidural Steroid Injection - Sub-Pedicular Approach with Fluoroscopic Guidance  Patient: Stefanie Carter      Date of Birth: 07/16/1946 MRN: [id number] PCP: Jearldine Mina, MD      Visit Date:  02/15/2024   Universal Protocol:    Date/Time: 02/15/2024  Consent Given By: the patient  Position: PRONE  Additional Comments: Vital signs were monitored before and after the procedure. Patient was prepped and draped in the usual sterile fashion. The correct patient, procedure, and site was verified.   Injection Procedure Details:   Procedure diagnoses: Lumbar radiculopathy [M54.16]    Meds Administered:  Meds ordered this encounter  Medications   methylPREDNISolone  acetate (DEPO-MEDROL ) injection 40 mg    Laterality: Bilateral  Location/Site: L4  Needle:5.0 in., 22 ga.  Short bevel or Quincke spinal needle  Needle Placement: Transforaminal  Findings:    -Comments: Excellent flow of contrast along the nerve, nerve root and into the epidural space.  Procedure Details: After squaring off the end-plates to get a true AP view, the C-arm was positioned so that an oblique view of the foramen as noted above was visualized. The target area is just inferior to the "nose of the scotty dog" or sub pedicular. The soft tissues overlying this structure were infiltrated with 2-3 ml. of 1% Lidocaine  without Epinephrine.  The spinal needle was inserted toward the target using a "trajectory" view along the fluoroscope beam.  Under AP and lateral visualization, the needle was advanced so it did not puncture dura and was located close the 6 O'Clock position of the pedical in AP tracterory. Biplanar projections were used to confirm position. Aspiration was confirmed to be negative for CSF and/or blood. A 1-2 ml. volume of Isovue -250 was injected and flow of contrast was noted at each level. Radiographs were obtained for documentation purposes.   After attaining the desired flow of contrast documented above, a 0.5 to 1.0 ml test dose of 0.25% Marcaine was injected into each respective transforaminal space.  The patient was observed for 90 seconds post injection.  After no sensory deficits were  reported, and normal lower extremity motor function was noted,   the above injectate was administered so that equal amounts of the injectate were placed at each foramen (level) into the transforaminal epidural space.   Additional Comments:  The patient tolerated the procedure well Dressing: 2 x 2 sterile gauze and Band-Aid    Post-procedure details: Patient was observed during the procedure. Post-procedure instructions were reviewed.  Patient left the clinic in stable condition.    Clinical History: Cervical spine MRI 11/13/2014.   FINDINGS: Alignment: Unchanged 4 mm anterolisthesis of C3 on C4. 3 mm stair step retrolisthesis of C5 on C6 and C6 on C7.   Vertebrae: Similar appearance of the cystic lesion of the dens, favored to reflect a benign degenerative geode given surrounding severe degenerative change of the C1-2 articulation and associated degenerative pannus. Endplate marrow signal changes of the lower cervical spine.   Cord: Normal spinal cord signal and volume.   Posterior Fossa, vertebral arteries, paraspinal tissues: Unremarkable.   Disc levels:   C1-C2: Progressive degenerative changes with  associated dorsal pannus results in moderate spinal canal stenosis, worse from prior. Moderate facet arthropathy results in compression of the exiting C2 nerve roots in the neural foramina, new from prior.   Otherwise unchanged cervical spondylosis as detailed below.   C2-C3:  Moderate bilateral facet arthropathy.   C3-C4: Anterolisthesis with uncovered disc. No spinal canal stenosis. Left-greater-than-right facet arthropathy and uncovertebral joint spurring results in moderate left neural foraminal narrowing.   C4-C5: Small disc bulge without spinal canal stenosis. Facet arthropathy and uncovertebral joint spurring results in moderate bilateral neural foraminal narrowing.   C5-C6: Retrolisthesis and superimposed disc bulge with ligamentum flavum buckling results  in mild spinal canal stenosis. Facet arthropathy and uncovertebral joint spurring contribute to severe bilateral neural foraminal narrowing.   C6-C7: Retrolisthesis with small superimposed disc bulge. No spinal canal stenosis. Facet arthropathy and uncovertebral joint spurring contribute to severe bilateral neural foraminal narrowing.   C7-T1: Small disc bulge without spinal canal stenosis. Severe disc height loss and bilateral facet arthropathy contribute to moderate bilateral neural foraminal narrowing.   IMPRESSION: 1. Progressive degenerative changes at C1-2 with associated dorsal pannus results in moderate spinal canal stenosis, worse from prior. Moderate facet arthropathy results in compression of the exiting C2 nerve roots in the neural foramina, new from prior. 2. Otherwise unchanged cervical spondylosis, with mild spinal canal stenosis at C5-6. 3. Severe bilateral neural foraminal narrowing at C5-6 and C6-7. 4. Moderate neural foraminal narrowing on the left at C3-4 and bilaterally at C4-5 and C7-T1.     Electronically Signed   By: Audra Blend M.D.   On: 08/27/2023 11:03  CLINICAL DATA:  Lumbar degenerative disc disease. Back pain radiating to the legs.   EXAM: MRI LUMBAR SPINE WITHOUT CONTRAST  ---   IMPRESSION: 1. Multilevel lumbar spondylosis, worst at L4-5, where there is severe spinal canal stenosis and severe bilateral neural foraminal narrowing. 2. Moderate spinal canal stenosis at L2-3 and mild-to-moderate spinal canal stenosis at L1-2. 3. Multilevel moderate to severe neural foraminal narrowing, as detailed above.     Electronically Signed   By: Audra Blend M.D.   On: 08/27/2023 10:42   She reports that she quit smoking about 12 years ago. Her smoking use included cigarettes. She started smoking about 42 years ago. She has a 30 pack-year smoking history. She has never used smokeless tobacco.  Recent Labs    11/08/23 0648 02/09/24 1209   HGBA1C 6.5* 6.2*    Objective:  VS:  HT:    WT:   BMI:     BP:123/70  HR:81bpm  TEMP: ( )  RESP:  Physical Exam Vitals and nursing note reviewed.  Constitutional:      General: She is not in acute distress.    Appearance: Normal appearance. She is well-developed. She is not ill-appearing.  HENT:     Head: Normocephalic and atraumatic.  Eyes:     Conjunctiva/sclera: Conjunctivae normal.     Pupils: Pupils are equal, round, and reactive to light.  Cardiovascular:     Rate and Rhythm: Normal rate.     Pulses: Normal pulses.  Pulmonary:     Effort: Pulmonary effort is normal.  Abdominal:     General: There is no distension.     Tenderness: There is no abdominal tenderness.  Musculoskeletal:        General: Tenderness present.     Cervical back: Neck supple. Tenderness present.     Right lower leg: No edema.     Left lower  leg: No edema.     Comments: She has some impingement of both shoulders with rotation she has good strength in extremities bilaterally with good 5 out of 5 strength in long finger flexion finger abduction wrist extension elbow flexion extension.  She does have painful range of motion of the cervical spine with limited range left and right.  She does have pain with extension.  Negative Spurling's test.  Positive trigger points.  In terms of her lower back she has a very flat lumbar spine with pain with extension.  She has no pain with hip movement.  She has good distal strength.  She has a negative Hoffmann's test bilaterally.  Skin:    General: Skin is warm and dry.     Findings: No erythema or rash.  Neurological:     General: No focal deficit present.     Mental Status: She is alert and oriented to person, place, and time.     Cranial Nerves: No cranial nerve deficit.     Sensory: No sensory deficit.     Motor: Weakness present. No abnormal muscle tone.     Coordination: Coordination normal.     Gait: Gait abnormal.  Psychiatric:        Mood and Affect:  Mood normal.        Behavior: Behavior normal.     Ortho Exam  Imaging: No results found.  Past Medical/Family/Surgical/Social History: Medications & Allergies reviewed per EMR, new medications updated. Patient Active Problem List   Diagnosis Date Noted   Cerebellar stroke (HCC) 12/01/2023   Vertigo 11/08/2023   Cellulitis of right lower extremity 11/08/2023   DM2 (diabetes mellitus, type 2) (HCC) 11/08/2023   GAD (generalized anxiety disorder) 11/08/2023   Acquired hypothyroidism 11/08/2023   History of COPD 11/08/2023   Acute ischemic stroke (HCC) 11/07/2023   Spinal stenosis 06/20/2013   HLD (hyperlipidemia) 06/20/2013   Essential hypertension 06/20/2013   Anxiety 06/20/2013   Past Medical History:  Diagnosis Date   Anxiety    Diabetes (HCC)    HLD (hyperlipidemia)    HTN (hypertension)    Hypothyroid    Spinal stenosis    Family History  Problem Relation Age of Onset   Heart disease Mother    COPD Mother    Kidney disease Father    Depression Father    Glaucoma Father    Past Surgical History:  Procedure Laterality Date   APPENDECTOMY     SALIVARY GLAND SURGERY     TONSILLECTOMY     Social History   Occupational History   Not on file  Tobacco Use   Smoking status: Former    Current packs/day: 0.00    Average packs/day: 1 pack/day for 30.0 years (30.0 ttl pk-yrs)    Types: Cigarettes    Start date: 11/03/1981    Quit date: 11/04/2011    Years since quitting: 12.3   Smokeless tobacco: Never  Vaping Use   Vaping status: Never Used  Substance and Sexual Activity   Alcohol use: Yes    Comment: socially   Drug use: Never   Sexual activity: Not Currently

## 2024-02-16 ENCOUNTER — Telehealth: Payer: Self-pay

## 2024-02-16 NOTE — Telephone Encounter (Signed)
 Pt cld regarding lab results. Informed Pt MD reviewed lab results that screening for diabetes is borderline but acceptable and cholesterol panel is overall ok but bad cholesterol (LDL) is still high. Also, informed Pt MD recommended starting Repatha or Praluent injectable medication for cholesterol control. Pt stated she is uncertain of injectable medications as she isn't able to giver herself injections. Discussed the ease of the auto-injectable pen and the needle is small. Pt stated she will think about it but is still not sure. Pt stated concerns about her stomach getting bigger and that she has an upcoming appt w/PCP. Discussed w/Pt she needs to make PCP aware of her concerns and that if she decides to use the Repatha or Praluent injectable cholesterol medication she can make her PCP aware also. Pt stated understanding and thanked Charity fundraiser for info.

## 2024-02-16 NOTE — Telephone Encounter (Signed)
-----   Message from Stefanie Carter sent at 02/14/2024 12:59 PM EDT ----- Louann Rous inform the patient that screening test for diabetes is borderline but acceptable.  Cholesterol profile is mostly okay except bad cholesterol is still high.  Since patient is on maximum dose of statin I would recommend adding Repatha or Praluent injections if patient is willing I can prescribe it

## 2024-02-22 NOTE — Procedures (Signed)
 Lumbosacral Transforaminal Epidural Steroid Injection - Sub-Pedicular Approach with Fluoroscopic Guidance  Patient: Stefanie Carter      Date of Birth: Dec 01, 1945 MRN: [id number] PCP: Jearldine Mina, MD      Visit Date: 02/15/2024   Universal Protocol:    Date/Time: 02/15/2024  Consent Given By: the patient  Position: PRONE  Additional Comments: Vital signs were monitored before and after the procedure. Patient was prepped and draped in the usual sterile fashion. The correct patient, procedure, and site was verified.   Injection Procedure Details:   Procedure diagnoses: Lumbar radiculopathy [M54.16]    Meds Administered:  Meds ordered this encounter  Medications   methylPREDNISolone  acetate (DEPO-MEDROL ) injection 40 mg    Laterality: Bilateral  Location/Site: L4  Needle:5.0 in., 22 ga.  Short bevel or Quincke spinal needle  Needle Placement: Transforaminal  Findings:    -Comments: Excellent flow of contrast along the nerve, nerve root and into the epidural space.  Procedure Details: After squaring off the end-plates to get a true AP view, the C-arm was positioned so that an oblique view of the foramen as noted above was visualized. The target area is just inferior to the "nose of the scotty dog" or sub pedicular. The soft tissues overlying this structure were infiltrated with 2-3 ml. of 1% Lidocaine  without Epinephrine.  The spinal needle was inserted toward the target using a "trajectory" view along the fluoroscope beam.  Under AP and lateral visualization, the needle was advanced so it did not puncture dura and was located close the 6 O'Clock position of the pedical in AP tracterory. Biplanar projections were used to confirm position. Aspiration was confirmed to be negative for CSF and/or blood. A 1-2 ml. volume of Isovue -250 was injected and flow of contrast was noted at each level. Radiographs were obtained for documentation purposes.   After attaining the  desired flow of contrast documented above, a 0.5 to 1.0 ml test dose of 0.25% Marcaine was injected into each respective transforaminal space.  The patient was observed for 90 seconds post injection.  After no sensory deficits were reported, and normal lower extremity motor function was noted,   the above injectate was administered so that equal amounts of the injectate were placed at each foramen (level) into the transforaminal epidural space.   Additional Comments:  The patient tolerated the procedure well Dressing: 2 x 2 sterile gauze and Band-Aid    Post-procedure details: Patient was observed during the procedure. Post-procedure instructions were reviewed.  Patient left the clinic in stable condition.

## 2024-02-24 DIAGNOSIS — R339 Retention of urine, unspecified: Secondary | ICD-10-CM | POA: Diagnosis not present

## 2024-02-26 ENCOUNTER — Encounter: Payer: Self-pay | Admitting: Obstetrics and Gynecology

## 2024-02-26 ENCOUNTER — Ambulatory Visit (INDEPENDENT_AMBULATORY_CARE_PROVIDER_SITE_OTHER): Admitting: Obstetrics and Gynecology

## 2024-02-26 VITALS — BP 104/76 | HR 56

## 2024-02-26 DIAGNOSIS — N814 Uterovaginal prolapse, unspecified: Secondary | ICD-10-CM

## 2024-02-26 DIAGNOSIS — N3941 Urge incontinence: Secondary | ICD-10-CM | POA: Diagnosis not present

## 2024-02-26 DIAGNOSIS — N812 Incomplete uterovaginal prolapse: Secondary | ICD-10-CM

## 2024-02-26 MED ORDER — VIBEGRON 75 MG PO TABS: 75.0000 mg | ORAL_TABLET | Freq: Every day | ORAL | 5 refills | Status: DC

## 2024-02-26 NOTE — Progress Notes (Signed)
 [facility] Return Visit  SUBJECTIVE  History of Present Illness: Stefanie Carter is a 78 y.o. female seen in follow-up for neurogenic bladder.  Pt suffered from a cerebellar stroke in January and Feb. Currently on plavix .   She has a pessary in place for prolapse. Has been having it cleaned at [facility] and she reports that she had a   Denies vaginal bleeding, pain or discharge.   Doing well with self-catheterizing. She is catheterizing 4-5 times per day. She has a lot of leakage especially in the morning. Sometimes she gets no warning. Goes through 5+ pads per day.   Past Medical History: Patient  has a past medical history of Anxiety, Diabetes (HCC), HLD (hyperlipidemia), HTN (hypertension), Hypothyroid, and Spinal stenosis.   Past Surgical History: She  has a past surgical history that includes Appendectomy; Tonsillectomy; and Salivary gland surgery.   Medications: She has a current medication list which includes the following prescription(s): acetaminophen , aspirin  ec, atorvastatin , breztri aerosphere, caffeine, clopidogrel , co q10, famotidine , gabapentin , guaifenesin, ipratropium-albuterol , levothyroxine , loratadine, lorazepam , magnesium , meclizine , multiple vitamin, omega-3 fatty acids, pseudoephedrine, senna, sertraline , cholecalciferol, vitamin e, ibuprofen, and vibegron.   Allergies: Patient is allergic to penicillins, metformin hcl, sulfa antibiotics, sulfamethoxazole-trimethoprim, and varenicline.   Social History: Patient  reports that she quit smoking about 12 years ago. Her smoking use included cigarettes. She started smoking about 42 years ago. She has a 30 pack-year smoking history. She has never used smokeless tobacco. She reports current alcohol use. She reports that she does not use drugs.      OBJECTIVE     Physical Exam: Vitals:   02/26/24 1133  BP: 104/76  Pulse: (!) 56    Gen: No apparent distress, A&O x 3.  Detailed Urogynecologic  Evaluation:  Normal external genitalia  Pessary was noted to be in place. Pessary removed and cleaned. Speculum exam showed no lesions or bleeding in the vagina. Pessary replaced and was comfortable.     ASSESSMENT AND PLAN    Stefanie Carter is a 78 y.o. with:  1. Urge incontinence   2. Uterovaginal prolapse, incomplete      Neurogenic bladder - Self cath 4+ times per day - We discussed medication to help decrease bladder urgency and leakage. She would be a candidate for botox but due to current plavix  use and recent stroke, would not recommend procedure. If there is a time in the future when she can safely pause the plavix , then can consider intravesical botox.  - Will prescribe Gemtesa 75mg  daily. Samples provided.   Prolapse - Continue incontinence ring pessary.  - Plan for cleanings every 3-4 months- has been going to [place]  Return 6 weeks for med follow up  Arma Lamp, MD

## 2024-03-01 DIAGNOSIS — I10 Essential (primary) hypertension: Secondary | ICD-10-CM | POA: Diagnosis not present

## 2024-03-01 DIAGNOSIS — I700 Atherosclerosis of aorta: Secondary | ICD-10-CM | POA: Diagnosis not present

## 2024-03-01 DIAGNOSIS — F331 Major depressive disorder, recurrent, moderate: Secondary | ICD-10-CM | POA: Diagnosis not present

## 2024-03-01 DIAGNOSIS — Z8673 Personal history of transient ischemic attack (TIA), and cerebral infarction without residual deficits: Secondary | ICD-10-CM | POA: Diagnosis not present

## 2024-03-01 DIAGNOSIS — E1169 Type 2 diabetes mellitus with other specified complication: Secondary | ICD-10-CM | POA: Diagnosis not present

## 2024-03-01 DIAGNOSIS — E782 Mixed hyperlipidemia: Secondary | ICD-10-CM | POA: Diagnosis not present

## 2024-03-01 DIAGNOSIS — J449 Chronic obstructive pulmonary disease, unspecified: Secondary | ICD-10-CM | POA: Diagnosis not present

## 2024-03-01 DIAGNOSIS — E039 Hypothyroidism, unspecified: Secondary | ICD-10-CM | POA: Diagnosis not present

## 2024-03-03 ENCOUNTER — Encounter: Payer: Self-pay | Admitting: Physical Medicine and Rehabilitation

## 2024-03-10 ENCOUNTER — Telehealth: Payer: Self-pay

## 2024-03-10 DIAGNOSIS — N3941 Urge incontinence: Secondary | ICD-10-CM

## 2024-03-10 NOTE — Telephone Encounter (Signed)
 Patient called the office to inform you that the Gemtesa  was somewhat helpful but admits to only taking it every other day. She did reach out to her insurance company for cost. Her cost would be $656. She will not be able to continue this medication and would like a more cost effective alternative.

## 2024-03-10 NOTE — Telephone Encounter (Signed)
 Pt states based upon Dr Janett Medin being familiar with her history of strokes he would be the one who could safely recommend a weight loss medication for her, please call pt.  Pt said she would rather a response from Dr Janett Medin since her primary Dr told her she would never have another stroke after 1st, she is now saying she has had 2 additional strokes since 1st.

## 2024-03-11 NOTE — Telephone Encounter (Signed)
 Cld Pt to inform her Dr. Janett Medin is not in the office today or next week as he is working in the hospital. Also discussed w/Pt that weight loss medications is not typically something Dr. Janett Medin manages but he will be sent a message to ask and we will contact her when he responds. Pt stated understanding and thankful for the call back.

## 2024-03-14 MED ORDER — TROSPIUM CHLORIDE 20 MG PO TABS
20.0000 mg | ORAL_TABLET | Freq: Two times a day (BID) | ORAL | 11 refills | Status: DC
Start: 2024-03-14 — End: 2024-03-15

## 2024-03-14 NOTE — Telephone Encounter (Signed)
 Prescribed trospium  20mg  BID as an alternative. Please inform patient.

## 2024-03-14 NOTE — Telephone Encounter (Signed)
 Attempted to contact patient. LVMTRC

## 2024-03-15 ENCOUNTER — Other Ambulatory Visit: Payer: Self-pay

## 2024-03-15 DIAGNOSIS — N3941 Urge incontinence: Secondary | ICD-10-CM

## 2024-03-15 MED ORDER — TROSPIUM CHLORIDE 20 MG PO TABS: 20.0000 mg | ORAL_TABLET | Freq: Two times a day (BID) | ORAL | 11 refills | Status: DC

## 2024-03-15 NOTE — Telephone Encounter (Signed)
 Patient has been notified and aware. Rx was resent as she wants all meds to go to [facility]

## 2024-03-25 DIAGNOSIS — R339 Retention of urine, unspecified: Secondary | ICD-10-CM | POA: Diagnosis not present

## 2024-03-30 ENCOUNTER — Telehealth: Payer: Self-pay | Admitting: *Deleted

## 2024-03-30 NOTE — Telephone Encounter (Signed)
 Received order form from [facility] for cath order 200 cath per month with additional refills given.  Frequency up to 7 times a day.   Order faxed and scanned in.  Pt has fu apt 04/07/24.  KD CMA

## 2024-03-31 ENCOUNTER — Telehealth: Payer: Self-pay

## 2024-03-31 NOTE — Telephone Encounter (Addendum)
 [facility] provider called the office stating Stefanie Carter went to them had had a pessary placed on March 25th. Provider is reaching out to us  due to patient not wanting to continue her pessary care with them and provider did not want her to lapsed in her pessary care.

## 2024-04-01 NOTE — Telephone Encounter (Signed)
 I can see her for an appointment for pessary care if she would like.

## 2024-04-06 ENCOUNTER — Telehealth: Payer: Self-pay

## 2024-04-06 ENCOUNTER — Ambulatory Visit: Admitting: Obstetrics and Gynecology

## 2024-04-06 NOTE — Telephone Encounter (Signed)
 Patient came into clinic thinking her appointment was today at 3:20 pm. Patients appointment is scheduled for tomorrow at 3:20. When I confirmed with her the appointment time and date she automatically decline the appointment for tomorrow June 12 @ 3:20 pm. Patient did not wish to reschedule her appointment but wanted me to let her provider know that she is not going to continue with the medication that was prescribed, patient states that the medication is causing her to have constipation.   Please advise or follow up.

## 2024-04-07 ENCOUNTER — Ambulatory Visit: Admitting: Obstetrics and Gynecology

## 2024-04-14 DIAGNOSIS — I509 Heart failure, unspecified: Secondary | ICD-10-CM | POA: Diagnosis not present

## 2024-04-14 DIAGNOSIS — J449 Chronic obstructive pulmonary disease, unspecified: Secondary | ICD-10-CM | POA: Diagnosis not present

## 2024-04-14 DIAGNOSIS — R635 Abnormal weight gain: Secondary | ICD-10-CM | POA: Diagnosis not present

## 2024-04-19 DIAGNOSIS — R140 Abdominal distension (gaseous): Secondary | ICD-10-CM | POA: Diagnosis not present

## 2024-04-19 DIAGNOSIS — J449 Chronic obstructive pulmonary disease, unspecified: Secondary | ICD-10-CM | POA: Diagnosis not present

## 2024-04-19 DIAGNOSIS — K5909 Other constipation: Secondary | ICD-10-CM | POA: Diagnosis not present

## 2024-04-19 DIAGNOSIS — J441 Chronic obstructive pulmonary disease with (acute) exacerbation: Secondary | ICD-10-CM | POA: Diagnosis not present

## 2024-04-20 DIAGNOSIS — R140 Abdominal distension (gaseous): Secondary | ICD-10-CM | POA: Diagnosis not present

## 2024-04-21 NOTE — Telephone Encounter (Signed)
 Patient scheduled appt but came in on the wrong date and declined to be rescheduled. We haven't heard from the patient since, so I decided to call to get her scheduled, she didn't answer but I left her a message to return call our call. We're waiting on her response.

## 2024-04-25 DIAGNOSIS — R339 Retention of urine, unspecified: Secondary | ICD-10-CM | POA: Diagnosis not present

## 2024-04-26 NOTE — Telephone Encounter (Signed)
 Attempted to call patient and speak to her. No answer. Patient needs to be see for pessary check.

## 2024-04-27 ENCOUNTER — Encounter: Payer: Self-pay | Admitting: Physical Medicine and Rehabilitation

## 2024-04-27 ENCOUNTER — Encounter: Attending: Physical Medicine and Rehabilitation | Admitting: Physical Medicine and Rehabilitation

## 2024-04-27 VITALS — BP 128/75 | HR 64 | Ht 60.0 in | Wt 134.0 lb

## 2024-04-27 DIAGNOSIS — I10 Essential (primary) hypertension: Secondary | ICD-10-CM | POA: Diagnosis not present

## 2024-04-27 DIAGNOSIS — J449 Chronic obstructive pulmonary disease, unspecified: Secondary | ICD-10-CM | POA: Diagnosis not present

## 2024-04-27 DIAGNOSIS — R519 Headache, unspecified: Secondary | ICD-10-CM | POA: Insufficient documentation

## 2024-04-27 DIAGNOSIS — G894 Chronic pain syndrome: Secondary | ICD-10-CM

## 2024-04-27 DIAGNOSIS — E119 Type 2 diabetes mellitus without complications: Secondary | ICD-10-CM | POA: Diagnosis not present

## 2024-04-27 DIAGNOSIS — M62838 Other muscle spasm: Secondary | ICD-10-CM | POA: Diagnosis not present

## 2024-04-27 DIAGNOSIS — Z8673 Personal history of transient ischemic attack (TIA), and cerebral infarction without residual deficits: Secondary | ICD-10-CM | POA: Insufficient documentation

## 2024-04-27 DIAGNOSIS — M542 Cervicalgia: Secondary | ICD-10-CM | POA: Diagnosis not present

## 2024-04-27 DIAGNOSIS — F411 Generalized anxiety disorder: Secondary | ICD-10-CM | POA: Insufficient documentation

## 2024-04-27 DIAGNOSIS — E039 Hypothyroidism, unspecified: Secondary | ICD-10-CM | POA: Diagnosis not present

## 2024-04-27 DIAGNOSIS — I639 Cerebral infarction, unspecified: Secondary | ICD-10-CM

## 2024-04-27 DIAGNOSIS — Z79899 Other long term (current) drug therapy: Secondary | ICD-10-CM | POA: Diagnosis not present

## 2024-04-27 DIAGNOSIS — E785 Hyperlipidemia, unspecified: Secondary | ICD-10-CM | POA: Insufficient documentation

## 2024-04-27 DIAGNOSIS — M4802 Spinal stenosis, cervical region: Secondary | ICD-10-CM | POA: Diagnosis not present

## 2024-04-27 DIAGNOSIS — M5481 Occipital neuralgia: Secondary | ICD-10-CM | POA: Insufficient documentation

## 2024-04-27 DIAGNOSIS — R42 Dizziness and giddiness: Secondary | ICD-10-CM | POA: Diagnosis not present

## 2024-04-27 DIAGNOSIS — M7918 Myalgia, other site: Secondary | ICD-10-CM | POA: Insufficient documentation

## 2024-04-27 DIAGNOSIS — L03115 Cellulitis of right lower limb: Secondary | ICD-10-CM | POA: Diagnosis not present

## 2024-04-27 MED ORDER — DICLOFENAC SODIUM 1 % EX GEL: 2.0000 g | Freq: Four times a day (QID) | CUTANEOUS | Status: DC | PRN

## 2024-04-27 MED ORDER — METHOCARBAMOL 500 MG PO TABS: 250.0000 mg | ORAL_TABLET | Freq: Three times a day (TID) | ORAL | 5 refills | Status: DC | PRN

## 2024-04-27 NOTE — Progress Notes (Signed)
 Subjective:    Patient ID: Stefanie Carter, female    DOB: 1946/01/19, 78 y.o.   MRN: [id number]  HPI HPI  Stefanie Carter is a 78 y.o. year old female  who  has a past medical history of Anxiety, Diabetes (HCC), HLD (hyperlipidemia), HTN (hypertension), Hypothyroid, and Spinal stenosis. and left cerebellar infarct in January 2025 with slight extension in February 2025 secondary to terminal left vertebral artery occlusion likely from small vessel disease.  They are presenting to PM&R clinic as a new patient for pain management evaluation. They were referred by Williams, Megan E, NP  for treatment of chronic pain. Also referred by Dr. Lyda office for the same.  Per his referral: Eval and treat cervicalgia, occipital pain and shoulder pain in setting of anticoagulation post stroke x3 in January. Saw Dr. Bonner unable to do much interventional, same with me. I see her rarly for her lumbar spine for her stenosis here as well. Need comprehensive approach.   Source: Left posterior head and neck, into her shoulder penetrates into my eye, it's really stabbing. Has had numbness/tingling into her left arm in the past but it went away.   Has weakness on her R side from the stroke and pain in her right posterior shoulder from a RTC issue.   Inciting incident: Pain started at least 9 months ago, and I'm sick of it! And I told my doctor and told my doctor...but he wont do anything about it.  Description of pain: it I twist my head, it's an electrical shock. It is somewhat variable in severity but constantly present.  Exacerbating factors: bending forward, bending backwards, laying flat, sitting, activity, and rest Remitting factors: heating pad Red flag symptoms: No red flags for back pain endorsed in Hx or ROS  Medications tried: Topical medications (mild effect) : Salon-pas; would prefer a cream due to distribution Nsaids (moderate effect) : Advil can no longer use because of  plavix  Tylenol   (mild effect) : Takes 2 tabs a week. It makes my stomach sick - acid/indigestion. Opiates  (never tried) : she has not tried, has always refused Gabapentin  / Lyrica  (mild effect) : 100 mg at at bedtime for restless legs; she can take up to 3 times a day but does not use it because she does not like to be on medication - she will occasionally cut them in half if needed.   TCAs  (never tried) :  SNRIs  (never tried) : On zoloft  100 mg a day . 'When I went to school at [facility] they made me a [place] pig. I would get a stipend for xrays, but I only every got dead people. There were people stealing medication and they thought I was going to tell on them, so I ended up for so many weeks there and they gave me medicine so that I couldn't keep my head it. Patient denied psychiatric hospitalization, says they just kept me in the hospital because I was so exhausted.   Other  () : Muscle relaxants knock me out  Other treatments: PT/OT  (mild effect) : She had PT/OT after her stroke in February - mostly worked on balance, not neck. Paid OOP for it which killed me. She prefers to do things for herself.  Accupuncture/chiropractor/massage  (never tried) : She is afraid to go back to her chiropractor because of spinal stenosis.  TENs unit (Mild effect) : has had on back, not neck Injections (moderate effect) :  Bilateral L4  transforaminal epidural steroid injection with Dr. Delphia helped a lot for her back and legs but I still get restless legs   R C7-T1  ESI 10/08/2023  --it worked pretty good.   01/30/24 - TPI with Dr. Morse a little but he didn't want to cause a stroke.   Surgery (never tried) : None; I've been to everyone and no one wants to touch me.. plus I have a fear of surgery. Had parotid gland surgery in 1983.   Other  () :   Goals for pain control: I'd like to move around better. 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 It shows multilevel degenerative disc disease. The most significant finding is at C1-2, where there is moderate central canal stenosis due to dorsal pannus formation.   Prior UDS results:     Component Value Date/Time   LABOPIA NONE DETECTED 12/01/2023 0253   COCAINSCRNUR NONE DETECTED 12/01/2023 0253   LABBENZ NONE DETECTED 12/01/2023 0253   AMPHETMU NONE DETECTED 12/01/2023 0253   THCU NONE DETECTED 12/01/2023 0253   LABBARB NONE DETECTED 12/01/2023 0253      Pain Inventory Average Pain 10 Pain Right Now 8 My pain is constant, sharp, burning, dull, stabbing, tingling, and aching  In the last 24 hours, has pain interfered with the following? General activity 5 Relation with others 4 Enjoyment of life 5 What TIME of day is your pain at its worst? morning , daytime, evening, and night Sleep (in general) Good  Pain is worse with: walking, inactivity, standing, unsure, and some activites Pain improves with: therapy/exercise, pacing activities, and medication Relief from Meds: 4  walk without assistance walk with assistance use a cane ability to climb steps?  yes do you drive?  yes Do you have any goals in this area?  yes  retired Do you have any goals in this area?  yes  bladder control problems bowel control problems weakness numbness tingling trouble walking spasms dizziness anxiety  Any changes since last visit?  yes x-rays yes with [facility]   Any changes since last visit?  no    Family History  Problem Relation Age of Onset   Heart disease Mother    COPD Mother    Kidney disease Father    Depression Father    Glaucoma Father    Social History   Socioeconomic History   Marital status: Widowed    Spouse name: Not on file   Number of children: Not on file   Years of education: Not on file   Highest education level: Not on file  Occupational History   Not on file   Tobacco Use   Smoking status: Former    Current packs/day: 0.00    Average packs/day: 1 pack/day for 30.0 years (30.0 ttl pk-yrs)    Types: Cigarettes    Start date: 11/03/1981    Quit date: 11/04/2011    Years since quitting: 12.4   Smokeless tobacco: Never  Vaping Use   Vaping status: Never Used  Substance and Sexual Activity   Alcohol use: Yes    Comment: socially   Drug use: Never   Sexual activity: Not Currently  Other Topics Concern   Not on file  Social History Narrative   Not on file   Social Drivers of Health   Financial Resource Strain: Not on file  Food Insecurity: No Food Insecurity (12/01/2023)  Hunger Vital Sign    Worried About Running Out of Food in the Last Year: Never true    Ran Out of Food in the Last Year: Never true  Transportation Needs: No Transportation Needs (12/01/2023)   PRAPARE - Administrator, Civil Service (Medical): No    Lack of Transportation (Non-Medical): No  Physical Activity: Not on file  Stress: Not on file  Social Connections: Unknown (12/01/2023)   Social Connection and Isolation Panel    Frequency of Communication with Friends and Family: Never    Frequency of Social Gatherings with Friends and Family: Never    Attends Religious Services: Never    Database administrator or Organizations: No    Attends Engineer, structural: Never    Marital Status: Patient declined  Recent Concern: Social Connections - Socially Isolated (11/08/2023)   Social Connection and Isolation Panel    Frequency of Communication with Friends and Family: Never    Frequency of Social Gatherings with Friends and Family: Twice a week    Attends Religious Services: 1 to 4 times per year    Active Member of [employer] or Organizations: No    Attends Engineer, structural: Never    Marital Status: Never married   Past Surgical History:  Procedure Laterality Date   APPENDECTOMY     SALIVARY GLAND SURGERY     TONSILLECTOMY     Past Medical  History:  Diagnosis Date   Anxiety    Diabetes (HCC)    HLD (hyperlipidemia)    HTN (hypertension)    Hypothyroid    Spinal stenosis    Ht 5' (1.524 m)   Wt 134 lb (60.8 kg)   BMI 26.17 kg/m   Opioid Risk Score:   Fall Risk Score:  `1  Depression screen [facility] 2/9     04/27/2024    3:07 PM  Depression screen PHQ 2/9  Decreased Interest 2  Down, Depressed, Hopeless 0  PHQ - 2 Score 2  Altered sleeping 1  Tired, decreased energy 2  Change in appetite 0  Feeling bad or failure about yourself  0  Trouble concentrating 0  Moving slowly or fidgety/restless 1  Suicidal thoughts 0  PHQ-9 Score 6    Review of Systems  Constitutional:        Weight gain  Respiratory:  Positive for shortness of breath and wheezing.   Gastrointestinal:  Positive for constipation.  Genitourinary:        Incontinence I & O, rentention  Musculoskeletal:  Positive for back pain, gait problem and neck pain.       Pain in both shoulders, both legs spasms  Neurological:  Positive for dizziness, numbness and headaches.       Tingling  Psychiatric/Behavioral:         Anxiety  All other systems reviewed and are negative.      Objective:   Physical Exam    PE: Constitution: Appropriate appearance for age. No apparent distress   Resp: No respiratory distress. No accessory muscle usage. on RA and CTAB Cardio: Well perfused appearance. No peripheral edema. Abdomen: Nondistended. Nontender.   Psych: Appropriate mood and affect. Neuro: AAOx4. No apparent cognitive deficits   Neurologic Exam:   DTRs: Reflexes were 2+ in bilateral achilles, patella, biceps, BR and triceps. Babinsky: flexor responses b/l.   Hoffmans: negative b/l Sensory exam: revealed normal sensation in all dermatomal regions in bilateral upper extremities and bilateral lower extremities Motor exam: strength  5/5 throughout LUE and LLE; 5-/5 RUE; 5/5 RLE Coordination: Fine motor coordination was normal.   Gait:  normal  Cervical Exam:  Inspection: Shoulder girdles were  even. The cervical lordotic curvature was  WNL.  Palpatory exam: revealed ttp at the L>R cervical paraspinals, trapezius, medial scapular border, levaro scapulae, and ++ over L GON and LON. There was n muscle spasm. There were trigger points noted throughout. ROM: Flexion  WNL, Extension  WNL, Rotation limited b/l, Sidebending  limited b/l  Radiculopathy Special Tests: Spurling's maneuver : - Upper Limb Tension Test: - (Most Sensitive for r/o cervical radic. It consists of initially placing the patient in a supine position with the shoulder in a neutral position and the elbow and wrist flexed. The examiner then abducts the shoulder to 90 degrees, extends the elbow and fingers, and extends and supinates the wrist while the patient deviates the neck to the contralateral and then ipsilateral sides. A positive result is reproduction of pain at any step of the maneuver.  Myelopathy Special Tests: UMN signs: - Lhermitte Sign: -  Information in () parenthesis is normals/details of specific exam.         Assessment & Plan:  Reid Nawrot is a 78 y.o. year old female  who  has a past medical history of Anxiety, Diabetes (HCC), HLD (hyperlipidemia), HTN (hypertension), Hypothyroid, and Spinal stenosis.   They are presenting to PM&R clinic as a new patient for treatment of left occipital headaches radiating into her neck and shoulder; notable Hx stroke with R deficits,  Cervico-occipital neuralgia of left side Spinal stenosis in cervical region Patient presents with left-sided neck pain and history suspicious of occipital headaches over approximately the last 9 months.  Has been evaluated by interventional pain and had 1 epidural steroid injection, which helped a little bit but was limited, and subsequent injections were not offered due to her currently being on blood thinner.  Did get mild benefit from trigger point  injections.  Come back into the office in the next few weeks for trigger point injections and left greater and lesser occipital nerve blocks.  Patient wishes to hold off on opiate medications or any medications that could cognitively affect her is much as possible.  I am in agreement with this.   Cervical paraspinal muscle spasm TPI as above  Obtain Voltaren  gel over-the-counter, which is an anti-inflammatory gel you can use instead of taking an oral pill like ibuprofen.  You can use up to 4 times daily on your neck and shoulders for arthritic pain.  You can continue to take Tylenol  while on this medication.  I am prescribing Robaxin  500 mg up to 3 times daily as needed for muscle tightness.  Did discuss that baclofen would be a better medication, however due to concerns for chronic constipation that is contraindicated at this time.  She is okay to take half a tab if she wishes.  She wishes to continue independent exercises rather than going back to PT; gave home exercise program for cervical spine today and encouraged her to do this at least 3 times weekly.  Follow-up 6 to 8 weeks after trigger point injections; if no improvement at that time, we may consider Botox injections.  Cerebellar stroke (HCC) Hx R sided weakness, shoulder pain  R shoulder 10/14/23: IMPRESSION: 1. No fracture or dislocation of the right shoulder. 2. Moderate glenohumeral arthrosis with sizable inferiorly directed humeral osteophytes. 3. Possible prior right acromioclavicular resection.  Could consider steroid injections in  the future if bothersome  Other orders -     Methocarbamol ; Take 0.5-1 tablets (250-500 mg total) by mouth every 8 (eight) hours as needed for muscle spasms (neck pain and tightness).  Dispense: 90 tablet; Refill: 5 -     Diclofenac  Sodium; Apply 2 g topically 4 (four) times daily as needed (apply to neck and shoulder for headache and pain).

## 2024-04-27 NOTE — Patient Instructions (Signed)
 Patient presents with left-sided neck pain and history suspicious of occipital headaches over approximately the last 9 months.  Has been evaluated by interventional pain and had 1 epidural steroid injection, which helped a little bit but was limited, and subsequent injections were not offered due to her currently being on blood thinner.  Did get mild benefit from trigger point injections.  Come back into the office in the next few weeks for trigger point injections and left greater and lesser occipital nerve blocks.  Obtain Voltaren gel over-the-counter, which is an anti-inflammatory gel you can use instead of taking an oral pill like ibuprofen.  You can use up to 4 times daily on your neck and shoulders for arthritic pain.  You can continue to take Tylenol  while on this medication.  I am prescribing Robaxin 500 mg up to 3 times daily as needed for muscle tightness.  Did discuss that baclofen would be a better medication, however due to concerns for chronic constipation that is contraindicated at this time.  She is okay to take half a tab if she wishes.  She wishes to continue independent exercises rather than going back to PT; gave home exercise program for cervical spine today and encouraged her to do this at least 3 times weekly.  Follow-up 6 to 8 weeks after trigger point injections; if no improvement at that time, we may consider Botox injections.  Patient wishes to hold off on opiate medications or any medications that could cognitively affect her is much as possible.  I am in agreement with this.

## 2024-04-28 DIAGNOSIS — Z8673 Personal history of transient ischemic attack (TIA), and cerebral infarction without residual deficits: Secondary | ICD-10-CM | POA: Diagnosis not present

## 2024-04-28 DIAGNOSIS — I10 Essential (primary) hypertension: Secondary | ICD-10-CM | POA: Diagnosis not present

## 2024-04-28 DIAGNOSIS — M519 Unspecified thoracic, thoracolumbar and lumbosacral intervertebral disc disorder: Secondary | ICD-10-CM | POA: Diagnosis not present

## 2024-04-28 DIAGNOSIS — M5090 Cervical disc disorder, unspecified, unspecified cervical region: Secondary | ICD-10-CM | POA: Diagnosis not present

## 2024-04-28 DIAGNOSIS — M797 Fibromyalgia: Secondary | ICD-10-CM | POA: Diagnosis not present

## 2024-04-28 DIAGNOSIS — E039 Hypothyroidism, unspecified: Secondary | ICD-10-CM | POA: Diagnosis not present

## 2024-04-28 DIAGNOSIS — J449 Chronic obstructive pulmonary disease, unspecified: Secondary | ICD-10-CM | POA: Diagnosis not present

## 2024-05-03 DIAGNOSIS — M62838 Other muscle spasm: Secondary | ICD-10-CM | POA: Insufficient documentation

## 2024-05-03 DIAGNOSIS — M5481 Occipital neuralgia: Secondary | ICD-10-CM | POA: Insufficient documentation

## 2024-05-16 ENCOUNTER — Encounter: Admitting: Physical Medicine and Rehabilitation

## 2024-05-16 VITALS — BP 120/65 | HR 58 | Ht 60.0 in | Wt 135.0 lb

## 2024-05-16 DIAGNOSIS — E785 Hyperlipidemia, unspecified: Secondary | ICD-10-CM | POA: Diagnosis not present

## 2024-05-16 DIAGNOSIS — M542 Cervicalgia: Secondary | ICD-10-CM | POA: Diagnosis not present

## 2024-05-16 DIAGNOSIS — E119 Type 2 diabetes mellitus without complications: Secondary | ICD-10-CM | POA: Diagnosis not present

## 2024-05-16 DIAGNOSIS — E039 Hypothyroidism, unspecified: Secondary | ICD-10-CM | POA: Diagnosis not present

## 2024-05-16 DIAGNOSIS — M5481 Occipital neuralgia: Secondary | ICD-10-CM

## 2024-05-16 DIAGNOSIS — I10 Essential (primary) hypertension: Secondary | ICD-10-CM | POA: Diagnosis not present

## 2024-05-16 DIAGNOSIS — F411 Generalized anxiety disorder: Secondary | ICD-10-CM | POA: Diagnosis not present

## 2024-05-16 DIAGNOSIS — G8929 Other chronic pain: Secondary | ICD-10-CM | POA: Insufficient documentation

## 2024-05-16 DIAGNOSIS — L03115 Cellulitis of right lower limb: Secondary | ICD-10-CM | POA: Diagnosis not present

## 2024-05-16 DIAGNOSIS — M7918 Myalgia, other site: Secondary | ICD-10-CM | POA: Diagnosis not present

## 2024-05-16 MED ORDER — TRIAMCINOLONE ACETONIDE 40 MG/ML IJ SUSP
5.0000 mg | Freq: Once | INTRAMUSCULAR | Status: AC
Start: 2024-05-16 — End: 2024-05-16
  Administered 2024-05-16: 5.2 mg via INTRAMUSCULAR

## 2024-05-16 MED ORDER — LIDOCAINE HCL 1 % IJ SOLN
6.0000 mL | Freq: Once | INTRAMUSCULAR | Status: AC
  Administered 2024-05-16: 6 mL

## 2024-05-16 NOTE — Patient Instructions (Addendum)
-   Resume Usual Activities. Notify Physician of any unusual bleeding, erythema or concern for side effects as reviewed above. - Apply ice prn for pain - Tylenol  prn for pain - Follow up  as scheduled in 6-8 weeks - I agree with Dr. Sharie stopping Gabapentin  and starting Lyrica 25 mg daily

## 2024-05-16 NOTE — Progress Notes (Signed)
 HPI: Stefanie Carter is a 78 y.o. female with PMHx has Spinal stenosis in cervical region; HLD (hyperlipidemia); Essential hypertension; Anxiety; Acute ischemic stroke (HCC); Vertigo; Cellulitis of right lower extremity; DM2 (diabetes mellitus, type 2) (HCC); GAD (generalized anxiety disorder); Acquired hypothyroidism; History of COPD; Cerebellar stroke (HCC); Cervico-occipital neuralgia of left side; and Cervical paraspinal muscle spasm on their problem list. who presents to clinic for treatment of pain related to chronic Left  sided headaches and cervicalgia  via injection as described below.   Patient was taken off gabapentin  due to constipation. No major changes in medical history since last visit.   Physical Exam:  General: Appropriate appearance for age.  Mental Status: Appropriate mood and affect.  Cardiovascular: RRR, no m/r/g.  Respiratory: CTAB, no rales/rhonchi/wheezing.  Skin: No apparent rashes or lesions.  Neuro: Awake, alert, and oriented x3. No apparent deficits.  MSK: Moving all 4 limbs antigravity and against resistance.   PROCEDURE:  Left  greater occipital nerve block and bilateral trigger point injections   ICD-10-CM   1. Cervico-occipital neuralgia of left side  M54.81     2. Myofascial neck pain  M54.2       Goals with treatment: [ x ] Decrease pain [ x ] Improve Active / Passive ROM [ x ] Improve ADLs [  ] Improve functional mobility  MEDICATION:  [ x ] Kenalog  40 mg/mL  [ X ] Lidocaine  1%    CONSENT: Obtained in writing per policy. Consent uploaded to chart.  Benefits discussed.  Risks discussed included, but were not limited to, pain and discomfort, bleeding, bruising, allergic reaction, infection. All questions answered to patient/family member/guardian/ caregiver satisfaction. They would like to proceed with procedure. There are no noted contraindications to procedure.  PROCEDURE Time out was preformed No heat sources No antibiotics  The  patient was explained about both the benefits and risks of a  Left  greater occipital nerve block and bilateral trigger point injections as above. After the patient acknowledged an understanding of the risks and benefits, the patient agreed to proceed. The area was first marked and then prepped in an aseptic fashion with betadine / alcohol. A 30 g, 1/2 inch needle was directed via a direct approach into the Bilateral cervical paraspinals and trapezius muscles. The injection was completed with Kenalog  40 mg/ml 0.2 cc mixed with 6 cc of 1% lidocaine  after no blood was aspirated on pull back. Then, another syringe of the same mixture was directed 1/3 between the inion and the Left  mastoid bone, just medial to the occipital artery, and after no blood on drawback 3 cc was injected.  No complications were encountered. The patient tolerated the procedure well.  Impression: HPI: Stefanie Carter is a 78 y.o. female with PMHx has Spinal stenosis in cervical region; HLD (hyperlipidemia); Essential hypertension; Anxiety; Acute ischemic stroke (HCC); Vertigo; Cellulitis of right lower extremity; DM2 (diabetes mellitus, type 2) (HCC); GAD (generalized anxiety disorder); Acquired hypothyroidism; History of COPD; Cerebellar stroke (HCC); Cervico-occipital neuralgia of left side; and Cervical paraspinal muscle spasm on their problem list. who presents to clinic for treatment of cervicalgia and headache pain . They received a  Left  greater occipital nerve block and bilateral trigger point injections as above.  PLAN: - Resume Usual Activities. Notify Physician of any unusual bleeding, erythema or concern for side effects as reviewed above. - Apply ice prn for pain - Tylenol  prn for pain - Follow up  as scheduled in 6-8 weeks -  I agree with Dr. Sharie stopping Gabapentin  and starting Lyrica 25 mg daily

## 2024-05-18 ENCOUNTER — Telehealth: Payer: Self-pay

## 2024-05-18 DIAGNOSIS — I639 Cerebral infarction, unspecified: Secondary | ICD-10-CM

## 2024-05-18 DIAGNOSIS — M542 Cervicalgia: Secondary | ICD-10-CM

## 2024-05-18 DIAGNOSIS — E663 Overweight: Secondary | ICD-10-CM

## 2024-05-18 DIAGNOSIS — M5481 Occipital neuralgia: Secondary | ICD-10-CM

## 2024-05-18 NOTE — Telephone Encounter (Signed)
 Patient called stating she would like a referral to a nutritionist as discussed in last visit.

## 2024-05-23 NOTE — Telephone Encounter (Signed)
 Patient needs to have a call back from Dr. Emeline.  She wants to know about a tens unit--nutrition referral --should she have another ultrasound of her head done.  Please call patient at [phone].

## 2024-05-25 DIAGNOSIS — R339 Retention of urine, unspecified: Secondary | ICD-10-CM | POA: Diagnosis not present

## 2024-05-25 NOTE — Telephone Encounter (Signed)
 Spoke with patient over the phone regarding current symptoms.   1) Headache/neurologic deficits She feels the trigger point injections may have helped her neck pain a little bit, but her headaches have gotten severe and constant. The wrap over the top of her head and behind her eye. No prodrome, no nausea, no vision changes, improve but do not resolve with rest. They are similar in distribution to her prior headaches but worse.   She denies any new weakness, numbness/tingling, dysarthria, or cognitive issues.  +  vision worsening which is progressive and chronic and for which she is being seen by ophthalmology soon.  + balance deficits, which she feels is worsening  Regarding medications, she takes Tylenol  and Lyrica 25 mg at bedtime. She says she sleep better on lyrica, but is also more tired during the day and has worse restless legs.   She did not notice changes occurring suddenly, but is unsure how long they have been progressing over, and wonders if she needs a new brain MRI to evaluate for stroke.   2) She wants to try using a TENs unit on her neck, but does not know how to use one. She would like a PT referral for this.   3) She would like a referral to a nutritionist for weight loss and chronic constipation.    Plan:  1) Switch back from Lyrica to gabapentin  100 mg at bedtime + BID PRN; can continue to take tylenol  <2000 mg daily. Encouraged good hydration as well, since post-injection this can help with pain. If these measures do not help, we may consider starting topomax 25 mg daily for headaches, but hesitant given potential for sedation at this time. Patient is in agreement with this plan.   Discussed likely cause to be spinal stenosis/C2 impingement bilaterally seen on MRI 2024, but patient has been evalled and told she is not a surgical or injection candidate at this time; may look for second opinion.   2) Has follow up with Dr. Rosemarie 07/2024; reports worsening balance and  vision gradually worsening but no acute changes to indicate acute stroke - she is nonetheless concerned for recurrent stroke. I will send Dr. Rosemarie this message to request earlier follow up for evaluation; urged her to go immediately to the ER if any signs of acute stroke and reviewed FAST protocol with her.  3) Will send referral to PT for TENS unit trial and disbersement  4) BMI approx 25.5, overweight, will send referral for nutritional consultation

## 2024-05-26 ENCOUNTER — Telehealth: Payer: Self-pay

## 2024-05-26 DIAGNOSIS — H6121 Impacted cerumen, right ear: Secondary | ICD-10-CM | POA: Diagnosis not present

## 2024-05-26 NOTE — Telephone Encounter (Signed)
 Patient would like to try a Tens machine. The injections did not help her pain.

## 2024-05-30 DIAGNOSIS — J449 Chronic obstructive pulmonary disease, unspecified: Secondary | ICD-10-CM | POA: Diagnosis not present

## 2024-05-30 DIAGNOSIS — M5090 Cervical disc disorder, unspecified, unspecified cervical region: Secondary | ICD-10-CM | POA: Diagnosis not present

## 2024-05-30 DIAGNOSIS — Z8673 Personal history of transient ischemic attack (TIA), and cerebral infarction without residual deficits: Secondary | ICD-10-CM | POA: Diagnosis not present

## 2024-05-30 DIAGNOSIS — G2581 Restless legs syndrome: Secondary | ICD-10-CM | POA: Diagnosis not present

## 2024-05-30 DIAGNOSIS — I10 Essential (primary) hypertension: Secondary | ICD-10-CM | POA: Diagnosis not present

## 2024-05-30 DIAGNOSIS — K219 Gastro-esophageal reflux disease without esophagitis: Secondary | ICD-10-CM | POA: Diagnosis not present

## 2024-05-30 DIAGNOSIS — E039 Hypothyroidism, unspecified: Secondary | ICD-10-CM | POA: Diagnosis not present

## 2024-05-30 DIAGNOSIS — E1169 Type 2 diabetes mellitus with other specified complication: Secondary | ICD-10-CM | POA: Diagnosis not present

## 2024-05-30 DIAGNOSIS — M519 Unspecified thoracic, thoracolumbar and lumbosacral intervertebral disc disorder: Secondary | ICD-10-CM | POA: Diagnosis not present

## 2024-05-30 NOTE — Telephone Encounter (Signed)
 LVM and sent mychart msg asking pt to call back to schedule sooner appointment

## 2024-05-31 NOTE — Telephone Encounter (Signed)
 Patient called back for appt, she needs very specific times, can only do late afternoon. Was unable to locate anything sooner that she could do before 06/29/24 with Sarah. I do have her added to the wait list if something sooner comes open in the afternoons for her.

## 2024-05-31 NOTE — Telephone Encounter (Signed)
 Thank you; she can also see Stefanie Carter if she has an earlier appointment and the patient is agreeable

## 2024-06-01 ENCOUNTER — Telehealth: Payer: Self-pay | Admitting: Physical Medicine and Rehabilitation

## 2024-06-01 NOTE — Telephone Encounter (Signed)
 P called and stated physical therapy place she was referred to is giving her the run around and she would like a referral sent somewhere else

## 2024-06-01 NOTE — Addendum Note (Signed)
 Addended by: EMELINE SEARCH on: 06/01/2024 02:38 PM   Modules accepted: Orders

## 2024-06-08 DIAGNOSIS — H524 Presbyopia: Secondary | ICD-10-CM | POA: Diagnosis not present

## 2024-06-08 DIAGNOSIS — H5203 Hypermetropia, bilateral: Secondary | ICD-10-CM | POA: Diagnosis not present

## 2024-06-08 DIAGNOSIS — H52223 Regular astigmatism, bilateral: Secondary | ICD-10-CM | POA: Diagnosis not present

## 2024-06-08 DIAGNOSIS — Z961 Presence of intraocular lens: Secondary | ICD-10-CM | POA: Diagnosis not present

## 2024-06-08 DIAGNOSIS — H31011 Macula scars of posterior pole (postinflammatory) (post-traumatic), right eye: Secondary | ICD-10-CM | POA: Diagnosis not present

## 2024-06-20 ENCOUNTER — Encounter: Payer: Self-pay | Admitting: Rehabilitative and Restorative Service Providers"

## 2024-06-20 ENCOUNTER — Ambulatory Visit: Admitting: Rehabilitative and Restorative Service Providers"

## 2024-06-20 DIAGNOSIS — R293 Abnormal posture: Secondary | ICD-10-CM | POA: Diagnosis not present

## 2024-06-20 DIAGNOSIS — M542 Cervicalgia: Secondary | ICD-10-CM | POA: Diagnosis not present

## 2024-06-20 NOTE — Therapy (Signed)
 OUTPATIENT PHYSICAL THERAPY EVALUATION   Patient Name: Stefanie Carter MRN: [id number] DOB:05/22/1946, 78 y.o., female Today's Date: 06/20/2024  END OF SESSION:  PT End of Session - 06/20/24 1534     Visit Number 1    Number of Visits 10    Date for PT Re-Evaluation 08/29/24    Authorization Type [employer] $25 copay    Progress Note Due on Visit 10    PT Start Time 1515    PT Stop Time 1556    PT Time Calculation (min) 41 min    Activity Tolerance Patient limited by pain    Behavior During Therapy [facility] for tasks assessed/performed          Past Medical History:  Diagnosis Date   Anxiety    Diabetes (HCC)    HLD (hyperlipidemia)    HTN (hypertension)    Hypothyroid    Spinal stenosis    Past Surgical History:  Procedure Laterality Date   APPENDECTOMY     SALIVARY GLAND SURGERY     TONSILLECTOMY     Patient Active Problem List   Diagnosis Date Noted   Myofascial neck pain 05/16/2024   Cervico-occipital neuralgia of left side 05/03/2024   Cervical paraspinal muscle spasm 05/03/2024   Cerebellar stroke (HCC) 12/01/2023   Vertigo 11/08/2023   Cellulitis of right lower extremity 11/08/2023   DM2 (diabetes mellitus, type 2) (HCC) 11/08/2023   GAD (generalized anxiety disorder) 11/08/2023   Acquired hypothyroidism 11/08/2023   History of COPD 11/08/2023   Acute ischemic stroke (HCC) 11/07/2023   Spinal stenosis in cervical region 06/20/2013   HLD (hyperlipidemia) 06/20/2013   Essential hypertension 06/20/2013   Anxiety 06/20/2013    PCP: Ransom Other, MD  REFERRING PROVIDER: Emeline Joesph BROCKS, DO  REFERRING DIAG: M54.2 (ICD-10-CM) - Myofascial neck pain  THERAPY DIAG:  Cervicalgia  Abnormal posture  Rationale for Evaluation and Treatment: Rehabilitation  ONSET DATE:   SUBJECTIVE:                                                                                                                                                                                                          SUBJECTIVE STATEMENT: Pt indicated insidious in last year.  Pt indicated symptoms of pain in bilateral neck, Lt neck more into back of neck/head.  Reported pain as terrible .  Pt indicated taking medicine that knocks her out but not helping pain much.   Pt indicated not think she can keep living like this.  Reported injections in other body parts but not in the  neck.    PERTINENT HISTORY:  History of 3 strokes.  History   PAIN:  NPRS scale: terrible /10 Pain location: neck/back of head Pain description: sharp.  Aggravating factors: reported pain always there , raising head/turning.  Relieving factors: massager can help some  PRECAUTIONS: None  RED FLAGS: None  WEIGHT BEARING RESTRICTIONS: No  FALLS:  Has patient fallen in last 6 months? No  LIVING ENVIRONMENT: Lives in: House/apartment  OCCUPATION: Retired  PLOF: Independent  PATIENT GOALS: Have less pain    OBJECTIVE:    PATIENT SURVEYS: Patient-Specific Activity Scoring Scheme  0 represents "unable to perform." 10 represents "able to perform at prior level. 0 1 2 3 4 5 6 7 8 9  10 (Date and Score)  06/20/2024:   Pt did not complete today.   COGNITION: 06/20/2024 Overall cognitive status: Within functional limits for tasks assessed  SENSATION: 06/20/2024 Not tested  POSTURE:  06/20/2024 Severe forward head posture, rounded shoulders, increased thoracic kyphosis  PALPATION: 06/20/2024 Tenderness with trigger points throughout cervical muscle.    CERVICAL ROM:   06/20/2024: pain with all cervical movements.   ROM AROM (deg) Eval 06/20/2024  Flexion 7  Extension 5  Right lateral flexion   Left lateral flexion   Right rotation 25  Left rotation 32   (Blank rows = not tested)  UPPER EXTREMITY ROM:   ROM Right Eval 06/20/2024 Left Eval 06/20/2024  Shoulder flexion    Shoulder extension    Shoulder abduction    Shoulder adduction    Shoulder extension     Shoulder internal rotation    Shoulder external rotation    Elbow flexion    Elbow extension    Wrist flexion    Wrist extension    Wrist ulnar deviation    Wrist radial deviation    Wrist pronation    Wrist supination     (Blank rows = not tested)  UPPER EXTREMITY MMT:  MMT Right Eval 06/20/2024 Left Eval 06/20/2024  Shoulder flexion    Shoulder extension    Shoulder abduction    Shoulder adduction    Shoulder extension    Shoulder internal rotation    Shoulder external rotation    Middle trapezius    Lower trapezius    Elbow flexion    Elbow extension    Wrist flexion    Wrist extension    Wrist ulnar deviation    Wrist radial deviation    Wrist pronation    Wrist supination    Grip strength     (Blank rows = not tested)  SPECIAL TESTS:  06/20/2024 No specific testing today  FUNCTIONAL TESTS:  06/20/2024 No specific testing.   GAIT 06/20/2024: Ambulation c SPC in Lt UE, antalgic.  TODAY'S TREATMENT:                                                                                                       DATE:  06/20/2024 Neuro Re-ed: Pain neuroscience education given regarding nervous system elevation/sensitivity increases and how that can impact sensation and response to activity. Detailed importance of graded exercise (HEP, aerobic exercise options) and mobility during the day to promote reduced sensitivity.  Question and answer time given for improved understanding.   Estim: Pre mod to Lt and Rt cervical upper trap/paraspinals with education for possible home use based off a TENS unit order.  To tolerance level, 6 mins each side.     PATIENT EDUCATION:  06/20/2024 Education details: HEP, POC Person educated: Patient Education method: Explanation, Demonstration, Verbal cues, and  Handouts Education comprehension: verbalized understanding, returned demonstration, and verbal cues required  HOME EXERCISE PROGRAM: No specific exercises given today.   ASSESSMENT:  CLINICAL IMPRESSION: Patient is a 78 y.o. who comes to clinic with complaints of chronic neck pain, headache pants  with mobility, strength and movement coordination deficits that impair their ability to perform usual daily and recreational functional activities without increase difficulty/symptoms at this time.  Patient to benefit from skilled PT services to address impairments and limitations to improve to previous level of function without restriction secondary to condition.    OBJECTIVE IMPAIRMENTS: Abnormal gait, decreased activity tolerance, decreased balance, decreased coordination, decreased endurance, decreased mobility, difficulty walking, decreased ROM, decreased strength, hypomobility, increased fascial restrictions, impaired perceived functional ability, increased muscle spasms, impaired flexibility, impaired UE functional use, improper body mechanics, postural dysfunction, and pain.   ACTIVITY LIMITATIONS: carrying, lifting, bending, sitting, standing, squatting, sleeping, transfers, dressing, reach over head, hygiene/grooming, and locomotion level  PARTICIPATION LIMITATIONS: meal prep, cleaning, laundry, interpersonal relationship, driving, shopping, and community activity  PERSONAL FACTORS: Time since onset of injury/illness/exacerbation and DM, HTN, spinal stenosis are also affecting patient's functional outcome.   REHAB POTENTIAL: Fair    CLINICAL DECISION MAKING: Evolving/moderate complexity  EVALUATION COMPLEXITY: Moderate   GOALS: Goals reviewed with patient? Yes  SHORT TERM GOALS: (target date for Short term goals are 3 weeks 07/11/2024)  1.Patient will demonstrate independent use of home exercise program to maintain progress from in clinic treatments. Goal status: New  LONG TERM  GOALS: (target dates for all long term goals are 10 weeks  08/29/2024 )   1. Patient will demonstrate/report pain at worst less than or equal to 2/10 to facilitate minimal limitation in daily activity secondary to pain symptoms. Goal status: New   2. Patient will demonstrate independent use of home exercise program to facilitate ability to maintain/progress functional gains from skilled physical therapy services. Goal status: New  3.  Patient will demonstrate cervical AROM gain of 15 degrees each direction to facilitate usual head movements for daily activity including driving, self care.   Goal status: New    PLAN:  PT FREQUENCY: 1-2x/week  PT DURATION: 10 weeks  Can include 02853- PT Re-evaluation, 97110-Therapeutic exercises, 97530- Therapeutic activity, [id number]- Neuromuscular re-education, 97535- Self Care, 97140-  Manual therapy,  [id number]- Electrical stimulation (unattended), 97750 Physical performance testing, 02987- Traction (mechanical),   Patient/Family education, Balance training, Stair training, Taping, Dry Needling, Joint mobilization, Joint manipulation, Spinal manipulation, Spinal mobilization, Scar mobilization, Vestibular training, Visual/preceptual remediation/compensation, DME instructions, Cryotherapy, and Moist heat.  All performed as medically necessary.  All included unless contraindicated  PLAN FOR NEXT SESSION: Check up on TENS unit contact with Dempsey Gay from [facility] supply.     Stefanie Carter, PT, DPT, OCS, ATC 06/20/24  4:14 PM    Referring diagnosis? M54.2 (ICD-10-CM) - Myofascial neck pain Treatment diagnosis? (if different than referring diagnosis) M54.2 What was this (referring dx) caused by? []  Surgery []  Fall [x]  Ongoing issue []  Arthritis []  Other: ____________  Laterality: []  Rt []  Lt [x]  Both  Check all possible CPT codes:  *CHOOSE 10 OR LESS*    See Planned Interventions listed in the Plan section of the Evaluation.

## 2024-06-23 ENCOUNTER — Telehealth: Payer: Self-pay | Admitting: Physical Medicine and Rehabilitation

## 2024-06-23 NOTE — Telephone Encounter (Signed)
 P called and stated she wants to change her follow up appt on 9/22 to a botox injection bc nothing else is working. Is this okay to do?

## 2024-06-23 NOTE — Telephone Encounter (Signed)
 OK, please send auth for migraine botox and change the appointment. Thanks!

## 2024-06-24 DIAGNOSIS — R339 Retention of urine, unspecified: Secondary | ICD-10-CM | POA: Diagnosis not present

## 2024-06-29 ENCOUNTER — Ambulatory Visit: Admitting: Neurology

## 2024-06-29 ENCOUNTER — Encounter: Payer: Self-pay | Admitting: Neurology

## 2024-06-29 ENCOUNTER — Telehealth: Payer: Self-pay | Admitting: Neurology

## 2024-06-29 VITALS — BP 116/72 | HR 90 | Ht 60.0 in | Wt 137.0 lb

## 2024-06-29 DIAGNOSIS — M5481 Occipital neuralgia: Secondary | ICD-10-CM | POA: Diagnosis not present

## 2024-06-29 DIAGNOSIS — M542 Cervicalgia: Secondary | ICD-10-CM | POA: Diagnosis not present

## 2024-06-29 DIAGNOSIS — M4802 Spinal stenosis, cervical region: Secondary | ICD-10-CM

## 2024-06-29 DIAGNOSIS — I639 Cerebral infarction, unspecified: Secondary | ICD-10-CM

## 2024-06-29 NOTE — Patient Instructions (Signed)
 Check MRI of the brain Continue Plavix  for secondary stroke prevention Follow-up with primary care about abdominal issues Keep appointment for Botox injection at [facility]

## 2024-06-29 NOTE — Telephone Encounter (Signed)
 MRI order sent to [facility] to schedule. [phone]

## 2024-06-29 NOTE — Progress Notes (Addendum)
 [facility] [address] [place]. [place] [address] [phone]       OFFICE FOLLOW-UP NOTE  [name] Date of Birth:  November 14, 1945 Medical Record Number:  [id number]   HPI: Stefanie Carter is a pleasant 78 year old Caucasian lady seen today for initial office follow-up visit following hospital consultation for stroke.  History is obtained from the patient and review of electronic medical records.  I personally reviewed pertinent available imaging films in PACS.  She has past medical history for hypertension, diabetes, anxiety, cervical degenerative spine disease, former smoker, COPD.  She was initially admitted on 11/07/2023 for sudden onset of dizziness nausea vomiting and a fall.  CT scan at that time showed a left cerebellar small infarct and CT angiogram of the head and neck showed left V4 occlusion.  MRI showed left cerebellar infarct with involvement of left dorsal medulla as well.  EF on echocardiogram was 60 to 65%.  LDL cholesterol 88 mg percent.  Hemoglobin A1c was 6.5.  She was discharged on aspirin  and Plavix  and Lipitor .  She returned on 12/01/2023 with sudden onset of feeling of dizziness and nausea vomiting and was found to have left upper extremity ataxia and CT scan concerning for new superior left cerebellar small infarct.  MRI scan showed extension of the left cerebellar infarct involving superior cerebellar artery but no hemorrhagic transformation or mass effect.  Patient could not afford Brilinta  and hence aspirin  and Plavix  was continued.  She was discharged with home health physical Occupational Therapy.  She returned to the ER at 12/07/2023 first worsening headaches.  Noncontrast CT showed no acute abnormality and evolution of the recent subacute cerebellar infarcts.  MRI was recommended but patient refused further evaluation and after headache improved with IV Dilaudid  she left the ER on her own.  Patient states she is doing well now headaches  are resolved.  She gets dizzy only occasionally but is overall much better.  She is tolerating aspirin  and Plavix  but bruises easily.  She is tolerating Lipitor  well without side effects.  She has quit smoking completely.  She is trying to eat healthy but has not lost any weight.  She has not been started on any diabetic medication but she states she does not like her primary care physician thinking about switching to a new one.  They with history of tachycardia  Update 06/29/2024 SS: Comes today with multiple things to discuss and address.  Currently taking Plavix  75 mg daily, has stopped the aspirin .  Does bruise slightly easy.  Very concerned with upper abdominal distention, alternating between constipation/diarrhea.  Currently on dietary supplement.  Has been having multiple days of watery diarrhea.  Reports has gained 35 pounds in the last 2 months.  Would like to address her weight gain. Mentions pain starting from left occipital area, wraps around her head. Electrical shocks to left occipital area constant. Sees Dr. Emeline, who is doing trigger point injections, according to note considering Botox scheduled 9/22. Right shoulder pain reports rotator cuff issues. PT didn't help.  In the ER 12/07/2023 complaining of headache.  MRI was recommended but she left on her own.  CT head showed evolving acute/subacute left inferior cerebellar infarcts recommending MRI. Headaches have been going on for a few months before the stroke.   ROS:   14 system review of systems is positive for dizziness, imbalance, bruising all other systems negative  PMH:  Past Medical History:  Diagnosis Date   Anxiety    Diabetes (  HCC)    HLD (hyperlipidemia)    HTN (hypertension)    Hypothyroid    Spinal stenosis     Social History:  Social History   Socioeconomic History   Marital status: Widowed    Spouse name: Not on file   Number of children: Not on file   Years of education: Not on file   Highest education level:  Not on file  Occupational History   Not on file  Tobacco Use   Smoking status: Former    Current packs/day: 0.00    Average packs/day: 1 pack/day for 30.0 years (30.0 ttl pk-yrs)    Types: Cigarettes    Start date: 11/03/1981    Quit date: 11/04/2011    Years since quitting: 12.6   Smokeless tobacco: Never  Vaping Use   Vaping status: Never Used  Substance and Sexual Activity   Alcohol use: Yes    Comment: socially   Drug use: Never   Sexual activity: Not Currently  Other Topics Concern   Not on file  Social History Narrative   Not on file   Social Drivers of Health   Financial Resource Strain: Not on file  Food Insecurity: No Food Insecurity (12/01/2023)   Hunger Vital Sign    Worried About Running Out of Food in the Last Year: Never true    Ran Out of Food in the Last Year: Never true  Transportation Needs: No Transportation Needs (12/01/2023)   PRAPARE - Administrator, Civil Service (Medical): No    Lack of Transportation (Non-Medical): No  Physical Activity: Not on file  Stress: Not on file  Social Connections: Unknown (12/01/2023)   Social Connection and Isolation Panel    Frequency of Communication with Friends and Family: Never    Frequency of Social Gatherings with Friends and Family: Never    Attends Religious Services: Never    Database administrator or Organizations: No    Attends Engineer, structural: Never    Marital Status: Patient declined  Recent Concern: Social Connections - Socially Isolated (11/08/2023)   Social Connection and Isolation Panel    Frequency of Communication with Friends and Family: Never    Frequency of Social Gatherings with Friends and Family: Twice a week    Attends Religious Services: 1 to 4 times per year    Active Member of [employer] or Organizations: No    Attends Banker Meetings: Never    Marital Status: Never married  Intimate Partner Violence: Not At Risk (12/01/2023)   Humiliation, Afraid, Rape, and  Kick questionnaire    Fear of Current or Ex-Partner: No    Emotionally Abused: No    Physically Abused: No    Sexually Abused: No    Medications:   Current Outpatient Medications on File Prior to Visit  Medication Sig Dispense Refill   acetaminophen  (TYLENOL ) 325 MG tablet Take 325 mg by mouth every 6 (six) hours as needed for moderate pain (pain score 4-6).     aspirin  EC 81 MG tablet Take 81 mg by mouth daily. Swallow whole.     atorvastatin  (LIPITOR ) 80 MG tablet Take 1 tablet (80 mg total) by mouth daily. 30 tablet 1   BRILINTA  90 MG TABS tablet      Budeson-Glycopyrrol-Formoterol  (BREZTRI AEROSPHERE) 160-9-4.8 MCG/ACT AERO Inhale 2 puffs into the lungs in the morning and at bedtime.     caffeine 200 MG TABS tablet Take 200 mg by mouth daily as needed.  clopidogrel  (PLAVIX ) 75 MG tablet 1 tablet Orally Once a day; Duration: 90 days     Coenzyme Q10 (CO Q10) 100 MG CAPS Take 1 capsule by mouth daily.     diclofenac  Sodium (VOLTAREN  ARTHRITIS PAIN) 1 % GEL Apply 2 g topically 4 (four) times daily as needed (apply to neck and shoulder for headache and pain).     famotidine  (PEPCID ) 40 MG tablet Take 1 tablet (40 mg total) by mouth daily.     guaiFENesin (MUCINEX) 600 MG 12 hr tablet Take 300 mg by mouth daily.     ipratropium-albuterol  (DUONEB) 0.5-2.5 (3) MG/3ML SOLN Take 3 mLs by nebulization every 6 (six) hours as needed.     levothyroxine  (SYNTHROID ) 125 MCG tablet Take 1 tablet (125 mcg total) by mouth daily before breakfast.     loratadine (CLARITIN) 10 MG tablet Take 10 mg by mouth daily as needed for allergies.     LORazepam  (ATIVAN ) 0.5 MG tablet Take 0.5 mg by mouth every 8 (eight) hours as needed.     Magnesium  200 MG TABS Take 100 mg by mouth daily.     meclizine  (ANTIVERT ) 25 MG tablet Take 1 tablet (25 mg total) by mouth 3 (three) times daily as needed for dizziness. 30 tablet 0   methocarbamol  (ROBAXIN ) 500 MG tablet Take 0.5-1 tablets (250-500 mg total) by mouth every 8  (eight) hours as needed for muscle spasms (neck pain and tightness). 90 tablet 5   MULTIPLE VITAMIN PO Take 1 tablet by mouth daily.     Omega-3 Fatty Acids (FISH OIL PO) Take 1 capsule by mouth daily.     pseudoephedrine (SUDAFED) 30 MG tablet Take 30 mg by mouth every 6 (six) hours as needed for congestion.     senna (SENOKOT) 8.6 MG tablet Take 100 mg by mouth daily.     sertraline  (ZOLOFT ) 100 MG tablet Take 100 mg by mouth daily.     trospium  (SANCTURA ) 20 MG tablet Take 1 tablet (20 mg total) by mouth 2 (two) times daily. 60 tablet 11   VITAMIN D, CHOLECALCIFEROL, PO Take 1 tablet by mouth daily.     vitamin E 200 UNIT capsule Take 200 Units by mouth daily.     No current facility-administered medications on file prior to visit.    Allergies:   Allergies  Allergen Reactions   Other Other (See Comments)    Substance with sulfonamide structure and antibacterial mechanism of action (substance)   Penicillins Anaphylaxis   Metformin Hcl Other (See Comments)   Penicillin G Benzathine     Other Reaction(s): unknown   Sulfa Antibiotics     Had as a baby; doesn't know reaction.   Sulfamethoxazole-Trimethoprim Other (See Comments)    Other reaction(s): Unknown  Other Reaction(s): Not available  sulfamethoxazole / trimethoprim  Other Reaction(s): unknown   Varenicline Other (See Comments)    Other reaction(s): intol  Other Reaction(s): Not available  varenicline   Codeine Nausea And Vomiting    Pt states she does not have a reaction to codeine  Other Reaction(s): Not available  codeine   Physical Exam  General: The patient is alert.  Appears restless.   Skin: No significant peripheral edema is noted.  Neurologic Exam  Mental status: The patient is alert and oriented x 3 at the time of the examination. The patient has apparent normal recent and remote memory, with an apparently normal attention span and concentration ability. Little scattered in her thoughts.    Cranial  nerves: Facial symmetry is present. Speech is normal, no aphasia or dysarthria is noted. Extraocular movements are full. Visual fields are full.  Motor: The patient has good strength in all 4 extremities.  Sensory examination: Soft touch sensation is symmetric on the face, arms, and legs.  Coordination: The patient has good finger-nose-finger and heel-to-shin bilaterally.  Gait and station: Gait is cautious, uses single-point cane  Reflexes: Deep tendon reflexes are symmetric.  ASSESSMENT: 78 year old Caucasian lady with left cerebellar infarct in January 2025 with slight extension in February 2025 secondary to terminal left vertebral artery occlusion likely from small vessel disease.  Vascular risk factors of smoking, hypertension hyperlipidemia and borderline diabetes.  Multiple concerns today including left occipital neuralgia headache and abdominal distention with weight gain. MRI cervical spine in Oct 2024 showed cervical spine stenosis progressive degenerative changes most at C1-2 moderate spinal canal stenosis, compression of exiting C2 nerves.  - Check MRI of the brain given concern for headache as well as MRI was recommended during ER visit 12/07/2023 due to headache, CT showed similar evolving acute/subacute left cerebellar infarcts (MRI 12/01/23).  She left on her own prior to MRI imaging. -CTA head and neck February 2025 was negative for LVO and recannulization of the distal left vertebral artery since 11/07/2023.  Stable atherosclerosis in the head of the neck.  Plaque throughout the proximal left subclavian artery but no high-grade stenosis identified.  Chronic severe cervical spine degeneration. - She is going to Dr. Emeline at [facility] to receive trigger point injections to the occipital area, scheduled for Botox 9/22.  Has Robaxin  and diclofenac  gel. May consider gabapentin /Cymbalta? I didn't start anything else today due to concern for GI side effect - Follow-up with PCP about  weight gain with abdominal distention and bowel change  - Continue Plavix  75 mg daily for secondary stroke prevention - Strict management of vascular risk factors with a goal BP less than 130/90, A1c less than 7.0, LDL less than 70 for secondary stroke prevention - Follow with Dr. Rosemarie in 6 months   Lauraine Gayland MANDES, DNP  [facility] [address], [address] [place], [place] [address] [phone]

## 2024-07-02 DIAGNOSIS — K5900 Constipation, unspecified: Secondary | ICD-10-CM | POA: Diagnosis not present

## 2024-07-02 DIAGNOSIS — R140 Abdominal distension (gaseous): Secondary | ICD-10-CM | POA: Diagnosis not present

## 2024-07-03 NOTE — Progress Notes (Signed)
 I agree with the above plan

## 2024-07-07 ENCOUNTER — Telehealth: Payer: Self-pay | Admitting: Neurology

## 2024-07-07 NOTE — Telephone Encounter (Signed)
 Pt is asking for a call to discuss the reason as to why the MRI has been ordered

## 2024-07-11 DIAGNOSIS — Z1231 Encounter for screening mammogram for malignant neoplasm of breast: Secondary | ICD-10-CM | POA: Diagnosis not present

## 2024-07-15 ENCOUNTER — Ambulatory Visit: Admitting: Obstetrics and Gynecology

## 2024-07-15 ENCOUNTER — Encounter: Payer: Self-pay | Admitting: Obstetrics and Gynecology

## 2024-07-15 VITALS — BP 108/72 | HR 61

## 2024-07-15 DIAGNOSIS — N812 Incomplete uterovaginal prolapse: Secondary | ICD-10-CM

## 2024-07-15 DIAGNOSIS — Z960 Presence of urogenital implants: Secondary | ICD-10-CM | POA: Diagnosis not present

## 2024-07-15 DIAGNOSIS — N3941 Urge incontinence: Secondary | ICD-10-CM

## 2024-07-15 NOTE — Progress Notes (Signed)
 [facility] Return Visit  SUBJECTIVE  History of Present Illness: Corona Popovich Reiger is a 78 y.o. female seen in follow-up for neurogenic bladder.  Pt suffered from a cerebellar stroke in January and Feb. Currently on plavix .   She has an incontinence ring pessary in place for prolapse. No issued with the pessary and it is working well./    Doing well with self-catheterizing. She is catheterizing 4-5 times per day. She took the trospium  but it did not help her symptoms, so she stopped the medication. She does not feel she has that much urgency currently.   Past Medical History: Patient  has a past medical history of Anxiety, Diabetes (HCC), HLD (hyperlipidemia), HTN (hypertension), Hypothyroid, and Spinal stenosis.   Past Surgical History: She  has a past surgical history that includes Appendectomy; Tonsillectomy; and Salivary gland surgery.   Medications: She has a current medication list which includes the following prescription(s): acetaminophen , atorvastatin , breztri aerosphere, caffeine, clopidogrel , co q10, diclofenac  sodium, famotidine , guaifenesin, ipratropium-albuterol , levothyroxine , loratadine, lorazepam , magnesium , meclizine , methocarbamol , multiple vitamin, senna, sertraline , cholecalciferol, and vitamin e.   Allergies: Patient is allergic to other, penicillins, metformin hcl, penicillin g benzathine, sulfa antibiotics, sulfamethoxazole-trimethoprim, varenicline, and codeine.   Social History: Patient  reports that she quit smoking about 12 years ago. Her smoking use included cigarettes. She started smoking about 42 years ago. She has a 30 pack-year smoking history. She has never used smokeless tobacco. She reports current alcohol use. She reports that she does not use drugs.      OBJECTIVE     Physical Exam: Vitals:   07/15/24 1305  BP: 108/72  Pulse: 61    Gen: No apparent distress, A&O x 3.  Detailed Urogynecologic Evaluation:  Normal  external genitalia  Pessary was noted to be in place. Pessary removed and cleaned. Speculum exam showed no lesions or bleeding in the vagina. Pessary replaced and was comfortable.     ASSESSMENT AND PLAN    Ms. Mattia is a 78 y.o. with:  1. Uterovaginal prolapse, incomplete   2. Urge incontinence     Neurogenic bladder - Self cath 4+ times per day - Does not currently want a medication for urgency, but could consider botox in the future if needed. Would need to be able to pause plavix  for the procedure.   Prolapse - Continue incontinence ring pessary.  - Plan for cleanings every 4 months  Return 4 months  Rosaline LOISE Caper, MD  Time spent: I spent 22 minutes dedicated to the care of this patient on the date of this encounter to include pre-visit review of records, face-to-face time with the patient  and post visit documentation.

## 2024-07-18 ENCOUNTER — Encounter: Payer: Self-pay | Admitting: Physical Medicine and Rehabilitation

## 2024-07-18 ENCOUNTER — Encounter: Attending: Physical Medicine and Rehabilitation | Admitting: Physical Medicine and Rehabilitation

## 2024-07-18 VITALS — BP 124/74 | HR 60 | Ht 60.0 in | Wt 138.6 lb

## 2024-07-18 DIAGNOSIS — M62838 Other muscle spasm: Secondary | ICD-10-CM | POA: Insufficient documentation

## 2024-07-18 DIAGNOSIS — G8929 Other chronic pain: Secondary | ICD-10-CM | POA: Diagnosis not present

## 2024-07-18 DIAGNOSIS — R519 Headache, unspecified: Secondary | ICD-10-CM | POA: Insufficient documentation

## 2024-07-18 DIAGNOSIS — M542 Cervicalgia: Secondary | ICD-10-CM | POA: Insufficient documentation

## 2024-07-18 DIAGNOSIS — R635 Abnormal weight gain: Secondary | ICD-10-CM | POA: Diagnosis not present

## 2024-07-18 MED ORDER — SODIUM CHLORIDE (PF) 0.9 % IJ SOLN
2.0000 mL | Freq: Once | INTRAMUSCULAR | Status: AC
  Administered 2024-07-18: 2 mL

## 2024-07-18 MED ORDER — ONABOTULINUMTOXINA 100 UNITS IJ SOLR
200.0000 [IU] | Freq: Once | INTRAMUSCULAR | Status: AC
  Administered 2024-07-18: 155 [IU] via INTRAMUSCULAR

## 2024-07-18 NOTE — Patient Instructions (Addendum)
 Keep track of your frequency of headaches over the next 3 months  I recommend getting an MRI as instructed by Neurology  I am ordering labs to look at causes of weight gain from a hormonal level; please get these done between 8-9 am. They do not need to be fasting.  Follow up in 2 months

## 2024-07-18 NOTE — Progress Notes (Signed)
 HPI: Stefanie Carter is a 78 y.o. female with PMHx has Spinal stenosis in cervical region; HLD (hyperlipidemia); Essential hypertension; Anxiety; Acute ischemic stroke (HCC); Vertigo; Cellulitis of right lower extremity; DM2 (diabetes mellitus, type 2) (HCC); GAD (generalized anxiety disorder); Acquired hypothyroidism; History of COPD; Cerebellar stroke (HCC); Cervico-occipital neuralgia of left side; Cervical paraspinal muscle spasm; and Myofascial neck pain on their problem list. who presents to clinic for treatment of pain related to chronic headaches/migraines via injection as described below.   Saw Lauraine Born, NP with neurology 06/29/24:  Electrical shocks to left occipital area constant. Sees Dr. Emeline, who is doing trigger point injections, according to note considering Botox  scheduled 9/22. Right shoulder pain reports rotator cuff issues. PT didn't help.  In the ER 12/07/2023 complaining of headache.  MRI was recommended but she left on her own.  CT head showed evolving acute/subacute left inferior cerebellar infarcts recommending MRI. Headaches have been going on for a few months before the stroke.   07/02/24: Saw PCP - KUB with moderate stool retention and labs with normal renal and liver function.   Nothing is improving - everything is worse. I can't hardly exercise because I'm so fat, and I'm having a hard time breathing. She started noticing weight gain in Feb/March of this year about 25 lbs. This was around her stroke. She says her medications and eating habits have not changed, but her activity level has changes considerably. She was doing exercise classes 12x a week before her hospitalization; she does 6 a week now but it hurts too much for me to pull down on something and if I walk on the treadmill I can only walk for about 3 minutes.    Interval history:  No new concerns or complaints. No major changes in medical history since last visit.   Patient confirms that they have  >15 headaches per month--needs tylenol  every 4 hours; constant.   She says she has had complications with anesthesia before, got very sick - was told I can never have surgery by several surgeons, but not one for the neck that she is aware of.  ESI were helpful in the neck she says at least 1-2 months; she was told by Dr. Eldonna she could not get the neck and and low back done together, and has continued to get the low back done. God told me I can't have surgery or I will die.   Physical Exam:  General: Appropriate appearance for age.  Mental Status: Appropriate mood and affect.  Cardiovascular: RRR, no m/r/g.  Respiratory: CTAB, no rales/rhonchi/wheezing.  Skin: No apparent rashes or lesions.  Neuro: Awake, alert, and oriented x3. No apparent deficits.  MSK: Moving all 4 limbs antigravity and against resistance.   PROCEDURE: Botox  injections for Chronic Migraines Diagnosis:    ICD-10-CM   1. Chronic nonintractable headache, unspecified headache type  R51.9    G89.29     2. Cervical paraspinal muscle spasm  M62.838     3. Myofascial neck pain  M54.2        Goals with treatment: [ x ] Decrease frequency of headaches [ x ] Improve ADLs [  ] Improve work tolerance/reduce sick days  MEDICATION:  200 units of Botox   4 ml normal saline.  CONSENT: Obtained in writing per policy. Consent uploaded to chart.  Benefits discussed.  Risks discussed included, but were not limited to, pain and discomfort, bleeding, bruising, allergic reaction, infection. All questions answered to patient/family member/guardian/ caregiver satisfaction.  They would like to proceed with procedure. There are no noted contraindications to procedure.  PROCEDURE Time out was preformed No heat sources No antibiotics    Skin was cleaned with alcohol. A 30 cc, 1/2 inch needle was used to perform the injections at the sites below. Prior to injection, the needle plunger was aspirated to make sure the needle  was not within a blood vessel.  There was no blood retrieved on aspiration.    Injections  120 total units of Botox  was injected with a 30 gauge needle.   Injection Sites: L occipitalis: 15 units- 3 sites   R occiptalis: 15 units- 3 sites   L upper trapezius: 15 units- 3 sites R upper trapezius: 15 units- 3 sits                                     L paraspinal: 10 units- 2 sites R paraspinal: 10 units- 2 sites   Face L frontalis(2 injection sites):10 units                R frontalis(2 injection sites):- not done per patient request                   L corrugator:  - not done per patient request              R corrugator: not done per patient request                        Procerus: - not done per patient request                       L temporalis: 20 units R temporalis: 20 units                Total injected (Units):  120             Total wasted (Units):  80   Patient tolerated procedure well without complications.    No complications were encountered. The patient tolerated the procedure well.  Impression: HPI: Stefanie Carter is a 78 y.o. female with PMHx has Spinal stenosis in cervical region; HLD (hyperlipidemia); Essential hypertension; Anxiety; Acute ischemic stroke (HCC); Vertigo; Cellulitis of right lower extremity; DM2 (diabetes mellitus, type 2) (HCC); GAD (generalized anxiety disorder); Acquired hypothyroidism; History of COPD; Cerebellar stroke (HCC); Cervico-occipital neuralgia of left side; Cervical paraspinal muscle spasm; and Myofascial neck pain on their problem list. who presents to clinic for treatment of chronic migraines via botox  injections as above.   PLAN: - Resume Usual Activities. Notify Physician of any unusual bleeding, erythema or concern for side effects as reviewed above. - Apply ice prn for pain - Tylenol  prn for pain - Please bring migraine diary to next visit to assist in tracking and recording # monthly migraines  - I recommend getting  an MRI as instructed by Neurology  - I am ordering labs to look at causes of weight gain from a hormonal level; please get these done between 8-9 am. They do not need to be fasting.  - Follow up in 2 months for regular appointment; can evaluate at that time if repeat injections would be useful   Patient/Care Giver was ready to learn without apparent learning barriers. Education was provided on diagnosis, treatment options/plan according to patient's preferred learning style. Patient/Care Giver verbalized understanding and  agreement with the above plan.   Joesph JAYSON Likes, DO 07/18/2024

## 2024-07-21 DIAGNOSIS — R635 Abnormal weight gain: Secondary | ICD-10-CM | POA: Diagnosis not present

## 2024-07-22 LAB — COMPREHENSIVE METABOLIC PANEL WITH GFR
ALT: 36 IU/L — ABNORMAL HIGH (ref 0–32)
AST: 31 IU/L (ref 0–40)
Albumin: 4.3 g/dL (ref 3.8–4.8)
Alkaline Phosphatase: 120 IU/L (ref 49–135)
BUN/Creatinine Ratio: 25 (ref 12–28)
BUN: 20 mg/dL (ref 8–27)
Bilirubin Total: 0.4 mg/dL (ref 0.0–1.2)
CO2: 24 mmol/L (ref 20–29)
Calcium: 9.8 mg/dL (ref 8.7–10.3)
Chloride: 103 mmol/L (ref 96–106)
Creatinine, Ser: 0.81 mg/dL (ref 0.57–1.00)
Globulin, Total: 2.5 g/dL (ref 1.5–4.5)
Glucose: 92 mg/dL (ref 70–99)
Potassium: 3.9 mmol/L (ref 3.5–5.2)
Sodium: 144 mmol/L (ref 134–144)
Total Protein: 6.8 g/dL (ref 6.0–8.5)
eGFR: 75 mL/min/1.73 (ref >59)

## 2024-07-22 LAB — CORTISOL: Cortisol: 6.8 ug/dL (ref 6.2–19.4)

## 2024-07-22 LAB — HEMOGLOBIN A1C
Est. average glucose Bld gHb Est-mCnc: 146 mg/dL
Hgb A1c MFr Bld: 6.7 % — ABNORMAL HIGH (ref 4.8–5.6)

## 2024-07-25 ENCOUNTER — Telehealth: Payer: Self-pay | Admitting: Physical Medicine and Rehabilitation

## 2024-07-25 DIAGNOSIS — R339 Retention of urine, unspecified: Secondary | ICD-10-CM | POA: Diagnosis not present

## 2024-07-25 NOTE — Telephone Encounter (Signed)
 Patient has questions about her lab work results.. Would like a call back.

## 2024-07-26 ENCOUNTER — Ambulatory Visit: Payer: Self-pay | Admitting: Physical Medicine and Rehabilitation

## 2024-07-26 NOTE — Telephone Encounter (Signed)
 Discussed attached results as significant for elevated HA1C, slightly increased from prior (6.2->6.7); patient continues to express frustration with weight and now blood sugar control. She is working with the weight loss center and on dietary interventions without much improvement. She asks for Ozempic or a pill to assist in diabetes control and weight loss. Discussed that this is not something our office prescribes but I will send a message to her PCP for follow up.    Joesph JAYSON Likes, DO 07/26/2024

## 2024-08-09 ENCOUNTER — Telehealth: Payer: Self-pay | Admitting: Physical Medicine and Rehabilitation

## 2024-08-09 DIAGNOSIS — M48062 Spinal stenosis, lumbar region with neurogenic claudication: Secondary | ICD-10-CM

## 2024-08-09 DIAGNOSIS — M5416 Radiculopathy, lumbar region: Secondary | ICD-10-CM

## 2024-08-09 NOTE — Telephone Encounter (Signed)
 Pt called wanting to make an appt for lumbar epidural injection. Pt call back number is [phone].

## 2024-08-11 ENCOUNTER — Ambulatory Visit: Admitting: Neurology

## 2024-08-15 ENCOUNTER — Telehealth: Payer: Self-pay | Admitting: Physical Medicine and Rehabilitation

## 2024-08-15 NOTE — Telephone Encounter (Signed)
 Patient called and said that she wants to schedule for back injection. CB#[phone]

## 2024-08-19 ENCOUNTER — Encounter: Payer: Self-pay | Admitting: Neurology

## 2024-08-22 ENCOUNTER — Ambulatory Visit: Admitting: Physical Medicine and Rehabilitation

## 2024-08-24 DIAGNOSIS — R339 Retention of urine, unspecified: Secondary | ICD-10-CM | POA: Diagnosis not present

## 2024-08-29 ENCOUNTER — Encounter: Payer: Self-pay | Admitting: Radiology

## 2024-08-30 ENCOUNTER — Ambulatory Visit
Admission: RE | Admit: 2024-08-30 | Discharge: 2024-08-30 | Disposition: A | Discharge status: Home / Self Care | Source: Ambulatory Visit | Attending: Neurology | Admitting: Neurology

## 2024-08-30 DIAGNOSIS — I639 Cerebral infarction, unspecified: Secondary | ICD-10-CM | POA: Diagnosis not present

## 2024-09-05 ENCOUNTER — Other Ambulatory Visit: Payer: Self-pay

## 2024-09-05 ENCOUNTER — Ambulatory Visit: Admitting: Physical Medicine and Rehabilitation

## 2024-09-05 VITALS — BP 128/71 | HR 87

## 2024-09-05 DIAGNOSIS — M5416 Radiculopathy, lumbar region: Secondary | ICD-10-CM | POA: Diagnosis not present

## 2024-09-05 MED ORDER — METHYLPREDNISOLONE ACETATE 40 MG/ML IJ SUSP
40.0000 mg | Freq: Once | INTRAMUSCULAR | Status: AC
  Administered 2024-09-05: 40 mg

## 2024-09-05 NOTE — Progress Notes (Signed)
 Pain Scale   Average Pain 8 Patient advising she has lower back pain radiating to bilateral legs and pain is constant.        +Driver, -BT, -Dye Allergies.

## 2024-09-06 ENCOUNTER — Ambulatory Visit: Payer: Self-pay | Admitting: Neurology

## 2024-09-06 NOTE — Progress Notes (Signed)
 Subjective:    Patient ID: Stefanie Carter, female    DOB: 05-24-1946, 78 y.o.   MRN: [id number]  HPI   Stefanie Carter is a 78 y.o. year old female  who  has a past medical history of Anxiety, Diabetes (HCC), HLD (hyperlipidemia), HTN (hypertension), Hypothyroid, and Spinal stenosis.   They are presenting to PM&R clinic for follow up related to  left occipital headaches radiating into her neck and shoulder; notable Hx stroke with R deficits,  .  Plan from last visit: - Resume Usual Activities. Notify Physician of any unusual bleeding, erythema or concern for side effects as reviewed above. - Apply ice prn for pain - Tylenol  prn for pain - Please bring migraine diary to next visit to assist in tracking and recording # monthly migraines   - I recommend getting an MRI as instructed by Neurology   - I am ordering labs to look at causes of weight gain from a hormonal level; please get these done between 8-9 am. They do not need to be fasting.   - Follow up in 2 months for regular appointment; can evaluate at that time if repeat injections would be useful   Interval Hx:  Discussed the use of AI scribe software for clinical note transcription with the patient, who gave verbal consent to proceed.  History of Present Illness   Stefanie Carter is a 78 year old female who presents with persistent neck pain and headaches.  She experiences severe neck pain affecting both shoulders, limiting her mobility, especially in placing her hands behind her back. Daily headaches persist despite Botox  injections, with reduced severity through the eyeball but ongoing soreness in the skull. Previous MRI shows cervical spine issues. She has received shoulder injections with some relief but has not consulted a careers adviser. Current medications include gabapentin  100 mg at night, Robaxin  as needed, and Tylenol  every four hours for pain.  Numbness is present in her feet and toes, but not in  her hands. She has a history of Raynaud's and cervical spinal stenosis. Pain disrupts her sleep.        Pain Inventory Average Pain 9 Pain Right Now 8 My pain is intermittent, constant, sharp, burning, dull, stabbing, tingling, and aching  In the last 24 hours, has pain interfered with the following? General activity 8 Relation with others 8 Enjoyment of life 8 What TIME of day is your pain at its worst? daytime and night Sleep (in general) Fair  Pain is worse with: unsure Pain improves with: medication and injections Relief from Meds: 8  Family History  Problem Relation Age of Onset   Heart disease Mother    COPD Mother    Kidney disease Father    Depression Father    Glaucoma Father    Bladder Cancer Neg Hx    Renal cancer Neg Hx    Uterine cancer Neg Hx    Social History   Socioeconomic History   Marital status: Widowed    Spouse name: Not on file   Number of children: Not on file   Years of education: Not on file   Highest education level: Not on file  Occupational History   Not on file  Tobacco Use   Smoking status: Former    Current packs/day: 0.00    Average packs/day: 1 pack/day for 30.0 years (30.0 ttl pk-yrs)    Types: Cigarettes    Start date: 11/03/1981    Quit date: 11/04/2011  Years since quitting: 12.8   Smokeless tobacco: Never  Vaping Use   Vaping status: Never Used  Substance and Sexual Activity   Alcohol use: Yes    Comment: socially   Drug use: Never   Sexual activity: Not Currently  Other Topics Concern   Not on file  Social History Narrative   Right handed   Social Drivers of Health   Financial Resource Strain: Not on file  Food Insecurity: No Food Insecurity (12/01/2023)   Hunger Vital Sign    Worried About Running Out of Food in the Last Year: Never true    Ran Out of Food in the Last Year: Never true  Transportation Needs: No Transportation Needs (12/01/2023)   PRAPARE - Administrator, Civil Service (Medical): No     Lack of Transportation (Non-Medical): No  Physical Activity: Not on file  Stress: Not on file  Social Connections: Unknown (12/01/2023)   Social Connection and Isolation Panel    Frequency of Communication with Friends and Family: Never    Frequency of Social Gatherings with Friends and Family: Never    Attends Religious Services: Never    Database Administrator or Organizations: No    Attends Engineer, Structural: Never    Marital Status: Patient declined  Recent Concern: Social Connections - Socially Isolated (11/08/2023)   Social Connection and Isolation Panel    Frequency of Communication with Friends and Family: Never    Frequency of Social Gatherings with Friends and Family: Twice a week    Attends Religious Services: 1 to 4 times per year    Active Member of [employer] or Organizations: No    Attends Engineer, Structural: Never    Marital Status: Never married   Past Surgical History:  Procedure Laterality Date   APPENDECTOMY     SALIVARY GLAND SURGERY     TONSILLECTOMY     Past Surgical History:  Procedure Laterality Date   APPENDECTOMY     SALIVARY GLAND SURGERY     TONSILLECTOMY     Past Medical History:  Diagnosis Date   Anxiety    Diabetes (HCC)    HLD (hyperlipidemia)    HTN (hypertension)    Hypothyroid    Spinal stenosis    There were no vitals taken for this visit.  Opioid Risk Score:   Fall Risk Score:  `1  Depression screen [facility] 2/9     07/18/2024    1:23 PM 04/27/2024    3:07 PM  Depression screen PHQ 2/9  Decreased Interest 1 2  Down, Depressed, Hopeless 1 0  PHQ - 2 Score 2 2  Altered sleeping  1  Tired, decreased energy  2  Change in appetite  0  Feeling bad or failure about yourself   0  Trouble concentrating  0  Moving slowly or fidgety/restless  1  Suicidal thoughts  0  PHQ-9 Score  6      Data saved with a previous flowsheet row definition    Review of Systems  Musculoskeletal:  Positive for neck pain.       Pain in  both shoulders  Neurological:  Positive for headaches.  All other systems reviewed and are negative.      Objective:   Physical Exam   Constitution: Appropriate appearance for age. No apparent distress   Resp: No respiratory distress. No accessory muscle usage.  Cardio: Well perfused appearance. No peripheral edema. Abdomen: Nondistended. Nontender.   Psych: Appropriate  mood and affect. Somewhat pressured, elevated.  Neuro: AAOx4. No apparent cognitive deficits    Neurologic Exam:   DTRs: Reflexes were 2+ in bilateral achilles, patella, biceps, BR and triceps. Babinsky: flexor responses b/l.   Hoffmans: negative b/l Sensory exam: revealed normal sensation in all dermatomal regions in bilateral upper extremities and bilateral lower extremities Motor exam: strength 5/5 throughout LUE and LLE; 5-/5 RUE; 5/5 RLE Coordination: Fine motor coordination was normal.   Gait: normal   Cervical Exam:  Inspection: Shoulder girdles were  even. The cervical lordotic curvature reduced/flattened    Palpatory exam: revealed ttp at the L>R cervical paraspinals, trapezius, medial scapular border, levator scapulae. There were trigger points noted throughout. ROM: Flexion  WNL, Extension  WNL, Rotation limited b/l, Sidebending  limited b/l   Radiculopathy Special Tests: Spurling's maneuver : - Lhermitte Sign: -     Assessment & Plan:   Assessment and Plan   They are presenting to PM&R clinic for follow up related to  left occipital headaches radiating into her neck and shoulder; notable Hx stroke with R deficits,  .   YOUR PLAN: CHRONIC DAILY HEADACHE: You have chronic daily headaches with pain around your eyes. Previous Botox  injections have helped, but the pain persists. - Failed TPI, GONB; some benefit from migraine botox  - still with daily headache but less severe -We will schedule repeat Botox  injections in a couple of weeks. Savings plan guide provided given expense.  -We will discuss  with your neurologist about starting something like Nurtec for migraine prevention.  CERVICAL SPINAL STENOSIS WITH CERVICALGIA: You have cervical spinal stenosis with neck pain radiating to your head. Previous cervical epidural steroid injections have provided relief. -We will refer you to interventional radiology for cervical epidural steroid injections.  You can discuss with Dr. Eldonna about performing neck injections if you prefer.  BILATERAL SHOULDER PAIN AND LIMITED MOBILITY: You have bilateral shoulder pain with limited mobility, especially in internal rotation. Previous injections have provided relief. -Continue your current pain management regimen with gabapentin , methocarbamol , and Tylenol .

## 2024-09-07 ENCOUNTER — Encounter: Payer: Self-pay | Admitting: Physical Medicine and Rehabilitation

## 2024-09-07 ENCOUNTER — Encounter: Attending: Physical Medicine and Rehabilitation | Admitting: Physical Medicine and Rehabilitation

## 2024-09-07 VITALS — BP 113/77 | HR 67 | Ht 60.0 in | Wt 131.0 lb

## 2024-09-07 DIAGNOSIS — M25512 Pain in left shoulder: Secondary | ICD-10-CM | POA: Diagnosis not present

## 2024-09-07 DIAGNOSIS — M4802 Spinal stenosis, cervical region: Secondary | ICD-10-CM | POA: Insufficient documentation

## 2024-09-07 DIAGNOSIS — M542 Cervicalgia: Secondary | ICD-10-CM | POA: Diagnosis not present

## 2024-09-07 DIAGNOSIS — G8929 Other chronic pain: Secondary | ICD-10-CM | POA: Diagnosis not present

## 2024-09-07 DIAGNOSIS — M25511 Pain in right shoulder: Secondary | ICD-10-CM | POA: Insufficient documentation

## 2024-09-07 DIAGNOSIS — K5900 Constipation, unspecified: Secondary | ICD-10-CM | POA: Diagnosis not present

## 2024-09-07 DIAGNOSIS — R519 Headache, unspecified: Secondary | ICD-10-CM | POA: Diagnosis not present

## 2024-09-07 MED ORDER — METHOCARBAMOL 500 MG PO TABS: 250.0000 mg | ORAL_TABLET | Freq: Three times a day (TID) | ORAL | 5 refills | Status: DC | PRN

## 2024-09-07 NOTE — Patient Instructions (Addendum)
  VISIT SUMMARY: You visited us  today for persistent neck pain and headaches. We discussed your chronic daily headaches, cervical spinal stenosis, and bilateral shoulder pain. We have outlined a plan to help manage your symptoms and improve your quality of life.  YOUR PLAN: CHRONIC DAILY HEADACHE: You have chronic daily headaches with pain around your eyes. Previous Botox  injections have helped, but the pain persists. -We will schedule repeat Botox  injections in a couple of weeks. Savings plan guide provided given expense.  -We will discuss with your neurologist about starting Nurtec for migraine prevention.  CERVICAL SPINAL STENOSIS WITH CERVICALGIA: You have cervical spinal stenosis with neck pain radiating to your head. Previous cervical epidural steroid injections have provided relief. -We will refer you to interventional radiology for cervical epidural steroid injections.  You can discuss with Doctor Eldonna about performing neck injections if you prefer.  BILATERAL SHOULDER PAIN AND LIMITED MOBILITY: You have bilateral shoulder pain with limited mobility, especially in internal rotation. Previous injections have provided relief. -Continue your current pain management regimen with gabapentin , methocarbamol , and Tylenol .    Contains text generated by [employer].

## 2024-09-11 NOTE — Procedures (Signed)
 Lumbosacral Transforaminal Epidural Steroid Injection - Sub-Pedicular Approach with Fluoroscopic Guidance  Patient: Stefanie Carter      Date of Birth: 25-Jul-1946 MRN: [id number] PCP: Ransom Other, MD      Visit Date: 09/05/2024   Universal Protocol:    Date/Time: 09/05/2024  Consent Given By: the patient  Position: PRONE  Additional Comments: Vital signs were monitored before and after the procedure. Patient was prepped and draped in the usual sterile fashion. The correct patient, procedure, and site was verified.   Injection Procedure Details:   Procedure diagnoses: Lumbar radiculopathy [M54.16]    Meds Administered:  Meds ordered this encounter  Medications   methylPREDNISolone  acetate (DEPO-MEDROL ) injection 40 mg    Laterality: Bilateral  Location/Site: L4  Needle:5.0 in., 22 ga.  Short bevel or Quincke spinal needle  Needle Placement: Transforaminal  Findings:    -Comments: Excellent flow of contrast along the nerve, nerve root and into the epidural space.  Procedure Details: After squaring off the end-plates to get a true AP view, the C-arm was positioned so that an oblique view of the foramen as noted above was visualized. The target area is just inferior to the nose of the scotty dog or sub pedicular. The soft tissues overlying this structure were infiltrated with 2-3 ml. of 1% Lidocaine  without Epinephrine.  The spinal needle was inserted toward the target using a trajectory view along the fluoroscope beam.  Under AP and lateral visualization, the needle was advanced so it did not puncture dura and was located close the 6 O'Clock position of the pedical in AP tracterory. Biplanar projections were used to confirm position. Aspiration was confirmed to be negative for CSF and/or blood. A 1-2 ml. volume of Isovue -250 was injected and flow of contrast was noted at each level. Radiographs were obtained for documentation purposes.   After attaining the  desired flow of contrast documented above, a 0.5 to 1.0 ml test dose of 0.25% Marcaine was injected into each respective transforaminal space.  The patient was observed for 90 seconds post injection.  After no sensory deficits were reported, and normal lower extremity motor function was noted,   the above injectate was administered so that equal amounts of the injectate were placed at each foramen (level) into the transforaminal epidural space.   Additional Comments:  The patient tolerated the procedure well Dressing: 2 x 2 sterile gauze and Band-Aid    Post-procedure details: Patient was observed during the procedure. Post-procedure instructions were reviewed.  Patient left the clinic in stable condition.

## 2024-09-11 NOTE — Progress Notes (Signed)
 Stefanie Carter - 78 y.o. female MRN [id number]  Date of birth: 12-29-1945  Office Visit Note: Visit Date: 09/05/2024 PCP: Ransom Other, MD Referred by: Ransom Other, MD  Subjective: Chief Complaint  Patient presents with   Lower Back - Pain   HPI:  Stefanie Carter is a 78 y.o. female who comes in today for planned repeat Bilateral L4-5  Lumbar Transforaminal epidural steroid injection with fluoroscopic guidance.  The patient has failed conservative care including home exercise, medications, time and activity modification.  This injection will be diagnostic and hopefully therapeutic.  Please see requesting physician notes for further details and justification. Patient received more than 50% pain relief from prior injection.   Referring: Duwaine Pouch, FNP   ROS Otherwise per HPI.  Assessment & Plan: Visit Diagnoses:    ICD-10-CM   1. Lumbar radiculopathy  M54.16 XR C-ARM NO REPORT    Epidural Steroid injection    methylPREDNISolone  acetate (DEPO-MEDROL ) injection 40 mg      Plan: No additional findings.   Meds & Orders:  Meds ordered this encounter  Medications   methylPREDNISolone  acetate (DEPO-MEDROL ) injection 40 mg    Orders Placed This Encounter  Procedures   XR C-ARM NO REPORT   Epidural Steroid injection    Follow-up: Return for visit to requesting provider as needed.   Procedures: No procedures performed  Lumbosacral Transforaminal Epidural Steroid Injection - Sub-Pedicular Approach with Fluoroscopic Guidance  Patient: Stefanie Carter      Date of Birth: Oct 26, 1946 MRN: [id number] PCP: Ransom Other, MD      Visit Date: 09/05/2024   Universal Protocol:    Date/Time: 09/05/2024  Consent Given By: the patient  Position: PRONE  Additional Comments: Vital signs were monitored before and after the procedure. Patient was prepped and draped in the usual sterile fashion. The correct patient, procedure, and site was  verified.   Injection Procedure Details:   Procedure diagnoses: Lumbar radiculopathy [M54.16]    Meds Administered:  Meds ordered this encounter  Medications   methylPREDNISolone  acetate (DEPO-MEDROL ) injection 40 mg    Laterality: Bilateral  Location/Site: L4  Needle:5.0 in., 22 ga.  Short bevel or Quincke spinal needle  Needle Placement: Transforaminal  Findings:    -Comments: Excellent flow of contrast along the nerve, nerve root and into the epidural space.  Procedure Details: After squaring off the end-plates to get a true AP view, the C-arm was positioned so that an oblique view of the foramen as noted above was visualized. The target area is just inferior to the nose of the scotty dog or sub pedicular. The soft tissues overlying this structure were infiltrated with 2-3 ml. of 1% Lidocaine  without Epinephrine.  The spinal needle was inserted toward the target using a trajectory view along the fluoroscope beam.  Under AP and lateral visualization, the needle was advanced so it did not puncture dura and was located close the 6 O'Clock position of the pedical in AP tracterory. Biplanar projections were used to confirm position. Aspiration was confirmed to be negative for CSF and/or blood. A 1-2 ml. volume of Isovue -250 was injected and flow of contrast was noted at each level. Radiographs were obtained for documentation purposes.   After attaining the desired flow of contrast documented above, a 0.5 to 1.0 ml test dose of 0.25% Marcaine was injected into each respective transforaminal space.  The patient was observed for 90 seconds post injection.  After no sensory deficits were reported, and normal lower extremity motor  function was noted,   the above injectate was administered so that equal amounts of the injectate were placed at each foramen (level) into the transforaminal epidural space.   Additional Comments:  The patient tolerated the procedure well Dressing: 2 x 2  sterile gauze and Band-Aid    Post-procedure details: Patient was observed during the procedure. Post-procedure instructions were reviewed.  Patient left the clinic in stable condition.    Clinical History: Cervical spine MRI 11/13/2014.   FINDINGS: Alignment: Unchanged 4 mm anterolisthesis of C3 on C4. 3 mm stair step retrolisthesis of C5 on C6 and C6 on C7.   Vertebrae: Similar appearance of the cystic lesion of the dens, favored to reflect a benign degenerative geode given surrounding severe degenerative change of the C1-2 articulation and associated degenerative pannus. Endplate marrow signal changes of the lower cervical spine.   Cord: Normal spinal cord signal and volume.   Posterior Fossa, vertebral arteries, paraspinal tissues: Unremarkable.   Disc levels:   C1-C2: Progressive degenerative changes with associated dorsal pannus results in moderate spinal canal stenosis, worse from prior. Moderate facet arthropathy results in compression of the exiting C2 nerve roots in the neural foramina, new from prior.   Otherwise unchanged cervical spondylosis as detailed below.   C2-C3:  Moderate bilateral facet arthropathy.   C3-C4: Anterolisthesis with uncovered disc. No spinal canal stenosis. Left-greater-than-right facet arthropathy and uncovertebral joint spurring results in moderate left neural foraminal narrowing.   C4-C5: Small disc bulge without spinal canal stenosis. Facet arthropathy and uncovertebral joint spurring results in moderate bilateral neural foraminal narrowing.   C5-C6: Retrolisthesis and superimposed disc bulge with ligamentum flavum buckling results in mild spinal canal stenosis. Facet arthropathy and uncovertebral joint spurring contribute to severe bilateral neural foraminal narrowing.   C6-C7: Retrolisthesis with small superimposed disc bulge. No spinal canal stenosis. Facet arthropathy and uncovertebral joint spurring contribute to severe  bilateral neural foraminal narrowing.   C7-T1: Small disc bulge without spinal canal stenosis. Severe disc height loss and bilateral facet arthropathy contribute to moderate bilateral neural foraminal narrowing.   IMPRESSION: 1. Progressive degenerative changes at C1-2 with associated dorsal pannus results in moderate spinal canal stenosis, worse from prior. Moderate facet arthropathy results in compression of the exiting C2 nerve roots in the neural foramina, new from prior. 2. Otherwise unchanged cervical spondylosis, with mild spinal canal stenosis at C5-6. 3. Severe bilateral neural foraminal narrowing at C5-6 and C6-7. 4. Moderate neural foraminal narrowing on the left at C3-4 and bilaterally at C4-5 and C7-T1.     Electronically Signed   By: Ryan Chess M.D.   On: 08/27/2023 11:03  CLINICAL DATA:  Lumbar degenerative disc disease. Back pain radiating to the legs.   EXAM: MRI LUMBAR SPINE WITHOUT CONTRAST  ---   IMPRESSION: 1. Multilevel lumbar spondylosis, worst at L4-5, where there is severe spinal canal stenosis and severe bilateral neural foraminal narrowing. 2. Moderate spinal canal stenosis at L2-3 and mild-to-moderate spinal canal stenosis at L1-2. 3. Multilevel moderate to severe neural foraminal narrowing, as detailed above.     Electronically Signed   By: Ryan Chess M.D.   On: 08/27/2023 10:42     Objective:  VS:  HT:    WT:   BMI:     BP:128/71  HR:87bpm  TEMP: ( )  RESP:  Physical Exam Vitals and nursing note reviewed.  Constitutional:      General: She is not in acute distress.    Appearance: Normal appearance. She is  not ill-appearing.  HENT:     Head: Normocephalic and atraumatic.     Right Ear: External ear normal.     Left Ear: External ear normal.  Eyes:     Extraocular Movements: Extraocular movements intact.  Cardiovascular:     Rate and Rhythm: Normal rate.     Pulses: Normal pulses.  Pulmonary:     Effort:  Pulmonary effort is normal. No respiratory distress.  Abdominal:     General: There is no distension.     Palpations: Abdomen is soft.  Musculoskeletal:        General: Tenderness present.     Cervical back: Neck supple.     Right lower leg: No edema.     Left lower leg: No edema.     Comments: Patient has good distal strength with no pain over the greater trochanters.  No clonus or focal weakness.  Skin:    Findings: No erythema, lesion or rash.  Neurological:     General: No focal deficit present.     Mental Status: She is alert and oriented to person, place, and time.     Sensory: No sensory deficit.     Motor: No weakness or abnormal muscle tone.     Coordination: Coordination normal.  Psychiatric:        Mood and Affect: Mood normal.        Behavior: Behavior normal.      Imaging: No results found.

## 2024-09-15 DIAGNOSIS — Z1212 Encounter for screening for malignant neoplasm of rectum: Secondary | ICD-10-CM | POA: Diagnosis not present

## 2024-09-15 DIAGNOSIS — Z1211 Encounter for screening for malignant neoplasm of colon: Secondary | ICD-10-CM | POA: Diagnosis not present

## 2024-09-19 ENCOUNTER — Telehealth: Payer: Self-pay | Admitting: *Deleted

## 2024-09-19 NOTE — Telephone Encounter (Signed)
 Stefanie Carter called about needing the MD to fill out her portion of the paperwork for the Botox  and she needs to get scheduled. I have let her know that Dr Emeline will not be back in the office until 09/26/24 and can address it at that time.

## 2024-09-26 DIAGNOSIS — E782 Mixed hyperlipidemia: Secondary | ICD-10-CM | POA: Diagnosis not present

## 2024-09-26 DIAGNOSIS — Z0000 Encounter for general adult medical examination without abnormal findings: Secondary | ICD-10-CM | POA: Diagnosis not present

## 2024-09-26 DIAGNOSIS — M797 Fibromyalgia: Secondary | ICD-10-CM | POA: Diagnosis not present

## 2024-09-26 DIAGNOSIS — E1140 Type 2 diabetes mellitus with diabetic neuropathy, unspecified: Secondary | ICD-10-CM | POA: Diagnosis not present

## 2024-09-26 DIAGNOSIS — Z8673 Personal history of transient ischemic attack (TIA), and cerebral infarction without residual deficits: Secondary | ICD-10-CM | POA: Diagnosis not present

## 2024-09-26 DIAGNOSIS — M519 Unspecified thoracic, thoracolumbar and lumbosacral intervertebral disc disorder: Secondary | ICD-10-CM | POA: Diagnosis not present

## 2024-09-26 DIAGNOSIS — Z1331 Encounter for screening for depression: Secondary | ICD-10-CM | POA: Diagnosis not present

## 2024-09-26 DIAGNOSIS — J449 Chronic obstructive pulmonary disease, unspecified: Secondary | ICD-10-CM | POA: Diagnosis not present

## 2024-09-26 DIAGNOSIS — I10 Essential (primary) hypertension: Secondary | ICD-10-CM | POA: Diagnosis not present

## 2024-09-26 DIAGNOSIS — F331 Major depressive disorder, recurrent, moderate: Secondary | ICD-10-CM | POA: Diagnosis not present

## 2024-09-26 DIAGNOSIS — E039 Hypothyroidism, unspecified: Secondary | ICD-10-CM | POA: Diagnosis not present

## 2024-09-27 NOTE — Telephone Encounter (Signed)
 I just caught up on paperwork from vacation and there were no forms to be filled out for her botox  - please ask her to bring in any necessary forms and I'll be happy to fill them out!

## 2024-09-29 DIAGNOSIS — R339 Retention of urine, unspecified: Secondary | ICD-10-CM | POA: Diagnosis not present

## 2024-10-04 ENCOUNTER — Encounter: Payer: Self-pay | Admitting: Physical Medicine and Rehabilitation

## 2024-10-05 NOTE — Telephone Encounter (Signed)
 LVM for pt to bring forms by office.

## 2024-10-18 ENCOUNTER — Ambulatory Visit: Admitting: Podiatry

## 2024-10-18 ENCOUNTER — Encounter: Payer: Self-pay | Admitting: Podiatry

## 2024-10-18 DIAGNOSIS — D2372 Other benign neoplasm of skin of left lower limb, including hip: Secondary | ICD-10-CM

## 2024-10-18 DIAGNOSIS — E7800 Pure hypercholesterolemia, unspecified: Secondary | ICD-10-CM | POA: Insufficient documentation

## 2024-10-18 DIAGNOSIS — E1140 Type 2 diabetes mellitus with diabetic neuropathy, unspecified: Secondary | ICD-10-CM | POA: Insufficient documentation

## 2024-10-18 DIAGNOSIS — I700 Atherosclerosis of aorta: Secondary | ICD-10-CM | POA: Insufficient documentation

## 2024-10-18 DIAGNOSIS — R32 Unspecified urinary incontinence: Secondary | ICD-10-CM | POA: Insufficient documentation

## 2024-10-18 DIAGNOSIS — F331 Major depressive disorder, recurrent, moderate: Secondary | ICD-10-CM | POA: Insufficient documentation

## 2024-10-18 DIAGNOSIS — M8580 Other specified disorders of bone density and structure, unspecified site: Secondary | ICD-10-CM | POA: Insufficient documentation

## 2024-10-18 DIAGNOSIS — B351 Tinea unguium: Secondary | ICD-10-CM

## 2024-10-18 DIAGNOSIS — M79674 Pain in right toe(s): Secondary | ICD-10-CM

## 2024-10-18 DIAGNOSIS — M79675 Pain in left toe(s): Secondary | ICD-10-CM | POA: Diagnosis not present

## 2024-10-18 DIAGNOSIS — Z8673 Personal history of transient ischemic attack (TIA), and cerebral infarction without residual deficits: Secondary | ICD-10-CM | POA: Insufficient documentation

## 2024-10-18 DIAGNOSIS — N951 Menopausal and female climacteric states: Secondary | ICD-10-CM | POA: Insufficient documentation

## 2024-10-18 DIAGNOSIS — D689 Coagulation defect, unspecified: Secondary | ICD-10-CM | POA: Diagnosis not present

## 2024-10-18 DIAGNOSIS — K219 Gastro-esophageal reflux disease without esophagitis: Secondary | ICD-10-CM | POA: Insufficient documentation

## 2024-10-18 DIAGNOSIS — J309 Allergic rhinitis, unspecified: Secondary | ICD-10-CM | POA: Insufficient documentation

## 2024-10-18 DIAGNOSIS — I509 Heart failure, unspecified: Secondary | ICD-10-CM

## 2024-10-18 DIAGNOSIS — D2371 Other benign neoplasm of skin of right lower limb, including hip: Secondary | ICD-10-CM

## 2024-10-18 DIAGNOSIS — M1990 Unspecified osteoarthritis, unspecified site: Secondary | ICD-10-CM | POA: Insufficient documentation

## 2024-10-18 DIAGNOSIS — G2581 Restless legs syndrome: Secondary | ICD-10-CM | POA: Insufficient documentation

## 2024-10-18 DIAGNOSIS — R270 Ataxia, unspecified: Secondary | ICD-10-CM | POA: Insufficient documentation

## 2024-10-18 DIAGNOSIS — J449 Chronic obstructive pulmonary disease, unspecified: Secondary | ICD-10-CM | POA: Insufficient documentation

## 2024-10-18 DIAGNOSIS — F325 Major depressive disorder, single episode, in full remission: Secondary | ICD-10-CM | POA: Insufficient documentation

## 2024-10-18 DIAGNOSIS — R7303 Prediabetes: Secondary | ICD-10-CM | POA: Insufficient documentation

## 2024-10-18 DIAGNOSIS — E1142 Type 2 diabetes mellitus with diabetic polyneuropathy: Secondary | ICD-10-CM | POA: Diagnosis not present

## 2024-10-18 DIAGNOSIS — G959 Disease of spinal cord, unspecified: Secondary | ICD-10-CM | POA: Insufficient documentation

## 2024-10-18 HISTORY — DX: Heart failure, unspecified: I50.9

## 2024-10-18 NOTE — Progress Notes (Signed)
 "  Subjective:  Patient ID: Stefanie Carter, female    DOB: February 15, 1946,  MRN: [id number] HPI Chief Complaint  Patient presents with   Foot Pain    Requesting foot exam - diabetic - last A1c was 6.7, gets wounds occasionally, needs toenails and calluses trimmed, Raynauds   New Patient (Initial Visit)    Est pt 08/2021    78 y.o. female presents with the above complaint.   ROS: Denies fever chills nausea mobic muscle aches pains calf pain back pain chest pain shortness of breath  Past Medical History:  Diagnosis Date   Anxiety    Diabetes (HCC)    HLD (hyperlipidemia)    HTN (hypertension)    Hypothyroid    Spinal stenosis    Past Surgical History:  Procedure Laterality Date   APPENDECTOMY     SALIVARY GLAND SURGERY     TONSILLECTOMY     Current Medications[1]  Allergies[2] Review of Systems Objective:  There were no vitals filed for this visit.  General: Well developed, nourished, in no acute distress, alert and oriented x3   Dermatological: Skin is warm, dry and supple bilateral.  Nails are thick yellow dystrophic possibly mycotic.  Remaining integument appears unremarkable at this time.  Other than the benign skin neoplasms plantar aspect of the forefoot no preulcerative lesions identified.  There are no open sores, no preulcerative lesions, no rash or signs of infection present.  Vascular: Dorsalis Pedis artery and Posterior Tibial artery pedal pulses are 2/4 bilateral with immedate capillary fill time. Pedal hair growth present. No varicosities and no lower extremity edema present bilateral.   Neruologic: Grossly intact via light touch bilateral. Vibratory intact via tuning fork bilateral. Protective threshold with Semmes Wienstein monofilament intact to all pedal sites bilateral. Patellar and Achilles deep tendon reflexes 2+ bilateral. No Babinski or clonus noted bilateral.   Musculoskeletal: No gross boney pedal deformities bilateral. No pain, crepitus, or  limitation noted with foot and ankle range of motion bilateral. Muscular strength 5/5 in all groups tested bilateral.  Hammertoe deformities bilateral early osteoarthritic changes.  Gait: Unassisted, Nonantalgic.    Radiographs:  None taken  Assessment & Plan:   Assessment: Diabetes mellitus without podiatric complications.  Pain limb secondary to onychomycosis and benign neoplasms.  Plan: Debridement of toenails and benign neoplasms bilateral.  She will follow-up with Dr. May Jude T. Jacques Fife, DPM    [1]  Current Outpatient Medications:    acetaminophen  (TYLENOL ) 325 MG tablet, Take 325 mg by mouth every 6 (six) hours as needed for moderate pain (pain score 4-6)., Disp: , Rfl:    atorvastatin  (LIPITOR ) 80 MG tablet, Take 1 tablet (80 mg total) by mouth daily., Disp: 30 tablet, Rfl: 1   Budeson-Glycopyrrol-Formoterol  (BREZTRI AEROSPHERE) 160-9-4.8 MCG/ACT AERO, Inhale 2 puffs into the lungs in the morning and at bedtime., Disp: , Rfl:    caffeine 200 MG TABS tablet, Take 200 mg by mouth daily as needed., Disp: , Rfl:    clopidogrel  (PLAVIX ) 75 MG tablet, 1 tablet Orally Once a day; Duration: 90 days, Disp: , Rfl:    Coenzyme Q10 (CO Q10) 100 MG CAPS, Take 1 capsule by mouth daily., Disp: , Rfl:    diclofenac  Sodium (VOLTAREN  ARTHRITIS PAIN) 1 % GEL, Apply 2 g topically 4 (four) times daily as needed (apply to neck and shoulder for headache and pain)., Disp: , Rfl:    famotidine  (PEPCID ) 40 MG tablet, Take 1 tablet (40 mg total) by  mouth daily., Disp: , Rfl:    gabapentin  (NEURONTIN ) 100 MG capsule, 1 capsule at bedtime Orally Once a day, Disp: , Rfl:    guaiFENesin (MUCINEX) 600 MG 12 hr tablet, Take 300 mg by mouth daily., Disp: , Rfl:    ipratropium-albuterol  (DUONEB) 0.5-2.5 (3) MG/3ML SOLN, Take 3 mLs by nebulization every 6 (six) hours as needed., Disp: , Rfl:    levothyroxine  (SYNTHROID ) 125 MCG tablet, Take 1 tablet (125 mcg total) by mouth daily before breakfast., Disp:  , Rfl:    loratadine (CLARITIN) 10 MG tablet, Take 10 mg by mouth daily as needed for allergies., Disp: , Rfl:    LORazepam  (ATIVAN ) 0.5 MG tablet, Take 0.5 mg by mouth every 8 (eight) hours as needed., Disp: , Rfl:    Magnesium  200 MG TABS, Take 100 mg by mouth daily., Disp: , Rfl:    meclizine  (ANTIVERT ) 25 MG tablet, Take 1 tablet (25 mg total) by mouth 3 (three) times daily as needed for dizziness., Disp: 30 tablet, Rfl: 0   methocarbamol  (ROBAXIN ) 500 MG tablet, Take 0.5-1 tablets (250-500 mg total) by mouth every 8 (eight) hours as needed for muscle spasms (neck pain and tightness)., Disp: 90 tablet, Rfl: 5   MULTIPLE VITAMIN PO, Take 1 tablet by mouth daily., Disp: , Rfl:    NURTEC 75 MG TBDP, Take 1 tablet by mouth daily as needed., Disp: , Rfl:    OZEMPIC, 0.25 OR 0.5 MG/DOSE, 2 MG/3ML SOPN, INJECT 0.25 MG SUBCUTANEOUSLY ONCE A WEEK FOR 4 WEEKS, THEN 0.5 MG SUBCUTANEOUSLY ONCE A WEEK., Disp: , Rfl:    senna (SENOKOT) 8.6 MG tablet, Take 100 mg by mouth daily., Disp: , Rfl:    sertraline  (ZOLOFT ) 100 MG tablet, Take 100 mg by mouth daily., Disp: , Rfl:    VITAMIN D, CHOLECALCIFEROL, PO, Take 1 tablet by mouth daily., Disp: , Rfl:    vitamin E 200 UNIT capsule, Take 200 Units by mouth daily., Disp: , Rfl:  [2]  Allergies Allergen Reactions   Other Other (See Comments)    Substance with sulfonamide structure and antibacterial mechanism of action (substance)   Penicillins Anaphylaxis   Metformin Hcl Other (See Comments)   Penicillin G Benzathine     Other Reaction(s): unknown   Sulfa Antibiotics     Had as a baby; doesn't know reaction.   Sulfamethoxazole-Trimethoprim Other (See Comments)    Other reaction(s): Unknown  Other Reaction(s): Not available  sulfamethoxazole / trimethoprim  Other Reaction(s): unknown   Varenicline Other (See Comments)    Other reaction(s): intol  Other Reaction(s): Not available  varenicline   "

## 2024-11-02 ENCOUNTER — Encounter: Attending: Physical Medicine and Rehabilitation | Admitting: Physical Medicine and Rehabilitation

## 2024-11-02 ENCOUNTER — Encounter: Payer: Self-pay | Admitting: Physical Medicine and Rehabilitation

## 2024-11-02 VITALS — BP 125/59 | HR 79 | Ht 60.0 in | Wt 129.8 lb

## 2024-11-02 DIAGNOSIS — M4802 Spinal stenosis, cervical region: Secondary | ICD-10-CM | POA: Insufficient documentation

## 2024-11-02 DIAGNOSIS — M5481 Occipital neuralgia: Secondary | ICD-10-CM | POA: Diagnosis not present

## 2024-11-02 DIAGNOSIS — G8929 Other chronic pain: Secondary | ICD-10-CM | POA: Diagnosis not present

## 2024-11-02 DIAGNOSIS — G43719 Chronic migraine without aura, intractable, without status migrainosus: Secondary | ICD-10-CM | POA: Insufficient documentation

## 2024-11-02 DIAGNOSIS — M542 Cervicalgia: Secondary | ICD-10-CM | POA: Diagnosis not present

## 2024-11-02 DIAGNOSIS — M25511 Pain in right shoulder: Secondary | ICD-10-CM | POA: Diagnosis not present

## 2024-11-02 DIAGNOSIS — I639 Cerebral infarction, unspecified: Secondary | ICD-10-CM | POA: Insufficient documentation

## 2024-11-02 DIAGNOSIS — M25512 Pain in left shoulder: Secondary | ICD-10-CM | POA: Diagnosis not present

## 2024-11-02 NOTE — Progress Notes (Signed)
 "  HPI: Stefanie Carter is a 79 y.o. female with PMHx has Spinal stenosis in cervical region; HLD (hyperlipidemia); Essential hypertension; Anxiety; Acute ischemic stroke (HCC); Vertigo; Cellulitis of right lower extremity; DM2 (diabetes mellitus, type 2) (HCC); GAD (generalized anxiety disorder); Acquired hypothyroidism; History of COPD; Cerebellar stroke (HCC); Cervico-occipital neuralgia of left side; Cervical paraspinal muscle spasm; Chronic neck pain; Chronic nonintractable headache; Weight gain; Bilateral shoulder pain; Constipation; Acquired thrombophilia; Allergic rhinitis; Ataxia; Chronic obstructive pulmonary disease (HCC); Controlled diabetes mellitus with diabetic neuropathy (HCC); Gastro-esophageal reflux disease without esophagitis; Hardening of the aorta (main artery of the heart); Heart failure (HCC); History of cerebellar stroke; Major depression in remission; Moderate recurrent major depression (HCC); Muscle pain; Osteoarthritis; Osteopenia; Prediabetes; Pure hypercholesterolemia; Restless legs; Spinal cord disorder (HCC); Urinary incontinence; and Vasomotor symptoms due to menopause on their problem list. who presents to clinic for treatment of pain related to chronic headaches/migraines via injection as described below.   Patient complains of itching along her L scalp over area of headaches but also extending over bilateral scalp and shoulders for several months.   She endorses ongoing severe neck pain and bilateral headaches, with minimal improvements with medications and incremental improvements with Botox .  Headaches occur daily, and are unremitting.  She initially states that she did not fill Nurtec because she was concerned about side effects and distrusting of whether she was a good candidate for it, later says that she got free samples at clinic and is willing to try it.  Patient is frustrated about the ongoing pain in her neck and shoulders.  She endorses no improvement with  prior injections, and that she is not a surgical candidate because she is not able to be intubated for cardiac reasons.  She perseverates on her pain throughout the injection.   Physical Exam:  General: Appropriate appearance for age.  Mental Status: Appropriate mood and affect.  Cardiovascular: RRR, no m/r/g.  Respiratory: CTAB, no rales/rhonchi/wheezing.  Skin: No apparent rashes or lesions.  Neuro: Awake, alert, and oriented x3. No apparent deficits.  MSK:  Moving all 4 limbs antigravity and against resistance.   Loss of cervical lordosis with somewhat exaggerated thoracic kyphosis and head position forward.  Extremely limited range of motion in all directions of the neck.  Tender to palpation throughout the bilateral cervical paraspinals and trapezius muscles; no notable tension in the SCM; significant tone in bilateral scalenes.   PROCEDURE: Botox  injections for Chronic Migraines Diagnosis:    ICD-10-CM   1. Chronic migraine without aura, intractable, without status migrainosus  G43.719 botulinum toxin Type A  (BOTOX ) injection 200 Units    sodium chloride  (PF) 0.9 % injection 4 mL    2. Cervico-occipital neuralgia of left side  M54.81     3. Spinal stenosis in cervical region  M48.02     4. Chronic neck pain  M54.2    G89.29     5. Cerebellar stroke (HCC)  I63.9     6. Bilateral shoulder pain, unspecified chronicity  M25.511    M25.512        Goals with treatment: [ x ] Decrease frequency of headaches [ x ] Improve ADLs [  ] Improve work tolerance/reduce sick days  MEDICATION:  200 units of Botox   4 ml normal saline.  CONSENT: Obtained in writing per policy. Consent uploaded to chart.  Benefits discussed.  Risks discussed included, but were not limited to, pain and discomfort, bleeding, bruising, allergic reaction, infection. All questions answered to patient/family member/guardian/  caregiver satisfaction. They would like to proceed with procedure. There are no  noted contraindications to procedure.  PROCEDURE Time out was preformed No heat sources No antibiotics    Skin was cleaned with alcohol. A 30 cc, 1/2 inch needle was used to perform the injections at the sites below. Prior to injection, the needle plunger was aspirated to make sure the needle was not within a blood vessel.  There was no blood retrieved on aspiration.    Injections  200 total units of Botox  were prepared    Injection Sites: L occipitalis: 15 units- 3 sites   R occiptalis: 15 units- 3 sites   L upper trapezius: 15 units- 3 sites R upper trapezius: 15 units- 3 sits                                     L paraspinal: 10 units- 2 sites R paraspinal: 10 units- 2 sites   Face L frontalis(2 injection sites):10 units                R frontalis(2 injection sites):- not done per patient request                   L corrugator:  - not done per patient request              R corrugator: not done per patient request                        Procerus: - not done per patient request                   Total injected (Units):  90             Total wasted (Units):  110     Patient tolerated procedure well without complications.    No complications were encountered. The patient tolerated the procedure well.  Impression: HPI: Stefanie Carter is a 79 y.o. female with PMHx has Spinal stenosis in cervical region; HLD (hyperlipidemia); Essential hypertension; Anxiety; Acute ischemic stroke (HCC); Vertigo; Cellulitis of right lower extremity; DM2 (diabetes mellitus, type 2) (HCC); GAD (generalized anxiety disorder); Acquired hypothyroidism; History of COPD; Cerebellar stroke (HCC); Cervico-occipital neuralgia of left side; Cervical paraspinal muscle spasm; Chronic neck pain; Chronic nonintractable headache; Weight gain; Bilateral shoulder pain; Constipation; Acquired thrombophilia; Allergic rhinitis; Ataxia; Chronic obstructive pulmonary disease (HCC); Controlled diabetes mellitus with  diabetic neuropathy (HCC); Gastro-esophageal reflux disease without esophagitis; Hardening of the aorta (main artery of the heart); Heart failure (HCC); History of cerebellar stroke; Major depression in remission; Moderate recurrent major depression (HCC); Muscle pain; Osteoarthritis; Osteopenia; Prediabetes; Pure hypercholesterolemia; Restless legs; Spinal cord disorder (HCC); Urinary incontinence; and Vasomotor symptoms due to menopause on their problem list. who presents to clinic for treatment of chronic migraines via botox  injections as above.   PLAN:  -Patient notes some mild improvement in severity of her neck pain and headaches with migraine Botox , and wishes to proceed with procedure today, but overall has not experienced a reduction in the frequency of her headaches.  - Discussed with patient possibility of anterocollis contributing to her symptoms, which the patient found challenging because most of her pain is in the posterior neck and shoulders and not the anterior neck and shoulders.  - Patient unwilling to consider surgical options due to cardiac risk with anesthesia, does not want neurosurgical  referral.   -  She is frustrated by the number of medications she is on and minimal relief in symptoms -does not wish to consider further medication options.  - Discussed with patient that I do not think continuing these injections in the future is to her benefit, and we have reached the limit of expertise with this office to assist her.  Will refer to neurology for evaluation of possible cervical dystonia contributing to her symptoms.  - Follow-up as needed.    Patient/Care Marko was ready to learn without apparent learning barriers. Education was provided on diagnosis, treatment options/plan according to patient's preferred learning style. Patient/Care Giver verbalized understanding and agreement with the above plan.   Joesph JAYSON Likes, DO 11/02/2024       "

## 2024-11-08 MED ORDER — SODIUM CHLORIDE (PF) 0.9 % IJ SOLN
4.0000 mL | Freq: Once | INTRAMUSCULAR | Status: AC
  Administered 2024-11-08: 4 mL

## 2024-11-08 MED ORDER — ONABOTULINUMTOXINA 100 UNITS IJ SOLR
200.0000 [IU] | Freq: Once | INTRAMUSCULAR | Status: AC
  Administered 2024-11-08: 200 [IU] via INTRAMUSCULAR

## 2024-11-16 ENCOUNTER — Ambulatory Visit: Admitting: Obstetrics and Gynecology

## 2024-11-23 ENCOUNTER — Telehealth: Payer: Self-pay | Admitting: Neurology

## 2024-11-23 DIAGNOSIS — G243 Spasmodic torticollis: Secondary | ICD-10-CM

## 2024-11-23 MED ORDER — INCOBOTULINUMTOXINA 100 UNITS IM SOLR: 100.0000 [IU] | INTRAMUSCULAR | 3 refills | Status: DC

## 2024-11-23 NOTE — Telephone Encounter (Signed)
Rx sent to [facility]. 

## 2024-11-23 NOTE — Telephone Encounter (Signed)
-----   Message from Modena Callander, MD sent at 11/22/2024 11:04 AM EST ----- Regarding: RE: please advise Ok to put patient on my schedule,   Collyn Selk:  We can do prior authorization for xeomin 300 units, EMG guided injection for cervical dystonia,  Initial visit and injection (if indicated)  will be at the same visit.  Delon:  Do not dissolve toxin until I evaluation patient first.   Thanks,   Yijun ----- Message ----- From: Rosemarie Eather RAMAN, MD Sent: 11/19/2024   3:17 PM EST To: Modena Callander, MD Subject: RE: please advise                              Ok for botox  if you feel will help ----- Message ----- From: Callander Modena, MD Sent: 11/18/2024  11:39 AM EST To: Eather RAMAN Rosemarie, MD; Lauraine JINNY Born, NP; Matti# Subject: RE: please advise                              Established patient of us , last seen by Lauraine in Sept, Do you think she will benefit BOTOX , if not, can continue follow up with Sarah/Dr. Rosemarie ----- Message ----- From: Rosemarie Eather RAMAN, MD Sent: 11/17/2024   4:34 PM EST To: Modena Callander, MD; Eloisa CHRISTELLA James Subject: RE: please advise                              See if JONETTA Callander wants to see for botox  ----- Message ----- From: James Eloisa CHRISTELLA Sent: 11/14/2024   2:22 PM EST To: Eather RAMAN Rosemarie, MD Subject: please advise                                  Hi Dr. Rosemarie!  This patient is being referred to us  by Dr. Emeline at [facility] for possible cervical dystonia? Per referral note- Evaluation for cervical dystonia and possible injections for severe neck pain and anterior positioning of head.  History of spinal stenosis and left occipital neuralgia, not responsive to nerve blocks, trigger point ejections, migraine Botox , ESI, or medications.  Unable to tolerate anesthesia for surgical options per patient..  Most recent MRI Cervical spine 08/18/23. Could you please review when you get a chance and advise if there's anything we would be able to do here for patient? With having  tried multiple treatment options, I wasn't sure.

## 2024-11-23 NOTE — Telephone Encounter (Signed)
 Submitted PA request via CMM and received approval. Please send rx to [facility].  Auth#: [id number] (11/23/24-10/26/25)

## 2024-11-23 NOTE — Addendum Note (Signed)
 Addended by: DOUGLASS DELON CROME on: 11/23/2024 10:33 AM   Modules accepted: Orders

## 2024-11-25 ENCOUNTER — Observation Stay (HOSPITAL_BASED_OUTPATIENT_CLINIC_OR_DEPARTMENT_OTHER)
Admission: EM | Admit: 2024-11-25 | Discharge: 2024-12-25 | DRG: 208 | Disposition: E | Attending: Internal Medicine | Admitting: Internal Medicine

## 2024-11-25 ENCOUNTER — Emergency Department (HOSPITAL_BASED_OUTPATIENT_CLINIC_OR_DEPARTMENT_OTHER)

## 2024-11-25 ENCOUNTER — Other Ambulatory Visit: Payer: Self-pay

## 2024-11-25 ENCOUNTER — Encounter (HOSPITAL_BASED_OUTPATIENT_CLINIC_OR_DEPARTMENT_OTHER): Payer: Self-pay | Admitting: Emergency Medicine

## 2024-11-25 ENCOUNTER — Emergency Department (HOSPITAL_BASED_OUTPATIENT_CLINIC_OR_DEPARTMENT_OTHER): Admitting: Radiology

## 2024-11-25 DIAGNOSIS — E039 Hypothyroidism, unspecified: Secondary | ICD-10-CM | POA: Diagnosis present

## 2024-11-25 DIAGNOSIS — Z7951 Long term (current) use of inhaled steroids: Secondary | ICD-10-CM

## 2024-11-25 DIAGNOSIS — I959 Hypotension, unspecified: Secondary | ICD-10-CM | POA: Diagnosis not present

## 2024-11-25 DIAGNOSIS — J9601 Acute respiratory failure with hypoxia: Secondary | ICD-10-CM | POA: Diagnosis present

## 2024-11-25 DIAGNOSIS — E876 Hypokalemia: Secondary | ICD-10-CM | POA: Diagnosis not present

## 2024-11-25 DIAGNOSIS — Z7902 Long term (current) use of antithrombotics/antiplatelets: Secondary | ICD-10-CM

## 2024-11-25 DIAGNOSIS — F331 Major depressive disorder, recurrent, moderate: Secondary | ICD-10-CM | POA: Diagnosis present

## 2024-11-25 DIAGNOSIS — I4891 Unspecified atrial fibrillation: Principal | ICD-10-CM

## 2024-11-25 DIAGNOSIS — R451 Restlessness and agitation: Secondary | ICD-10-CM | POA: Diagnosis not present

## 2024-11-25 DIAGNOSIS — W000XXA Fall on same level due to ice and snow, initial encounter: Secondary | ICD-10-CM | POA: Diagnosis present

## 2024-11-25 DIAGNOSIS — J440 Chronic obstructive pulmonary disease with acute lower respiratory infection: Secondary | ICD-10-CM | POA: Diagnosis not present

## 2024-11-25 DIAGNOSIS — Z7985 Long-term (current) use of injectable non-insulin antidiabetic drugs: Secondary | ICD-10-CM

## 2024-11-25 DIAGNOSIS — Z79890 Hormone replacement therapy (postmenopausal): Secondary | ICD-10-CM

## 2024-11-25 DIAGNOSIS — Z880 Allergy status to penicillin: Secondary | ICD-10-CM

## 2024-11-25 DIAGNOSIS — J9622 Acute and chronic respiratory failure with hypercapnia: Secondary | ICD-10-CM | POA: Diagnosis present

## 2024-11-25 DIAGNOSIS — F419 Anxiety disorder, unspecified: Secondary | ICD-10-CM | POA: Diagnosis present

## 2024-11-25 DIAGNOSIS — J9621 Acute and chronic respiratory failure with hypoxia: Principal | ICD-10-CM | POA: Diagnosis present

## 2024-11-25 DIAGNOSIS — E785 Hyperlipidemia, unspecified: Secondary | ICD-10-CM | POA: Diagnosis present

## 2024-11-25 DIAGNOSIS — Z882 Allergy status to sulfonamides status: Secondary | ICD-10-CM

## 2024-11-25 DIAGNOSIS — Z825 Family history of asthma and other chronic lower respiratory diseases: Secondary | ICD-10-CM

## 2024-11-25 DIAGNOSIS — Z66 Do not resuscitate: Secondary | ICD-10-CM | POA: Diagnosis present

## 2024-11-25 DIAGNOSIS — N8110 Cystocele, unspecified: Secondary | ICD-10-CM | POA: Diagnosis present

## 2024-11-25 DIAGNOSIS — Z9049 Acquired absence of other specified parts of digestive tract: Secondary | ICD-10-CM

## 2024-11-25 DIAGNOSIS — E1142 Type 2 diabetes mellitus with diabetic polyneuropathy: Secondary | ICD-10-CM | POA: Diagnosis not present

## 2024-11-25 DIAGNOSIS — Z515 Encounter for palliative care: Secondary | ICD-10-CM

## 2024-11-25 DIAGNOSIS — Z87891 Personal history of nicotine dependence: Secondary | ICD-10-CM

## 2024-11-25 DIAGNOSIS — Z79899 Other long term (current) drug therapy: Secondary | ICD-10-CM

## 2024-11-25 DIAGNOSIS — I10 Essential (primary) hypertension: Secondary | ICD-10-CM | POA: Diagnosis present

## 2024-11-25 DIAGNOSIS — N319 Neuromuscular dysfunction of bladder, unspecified: Secondary | ICD-10-CM | POA: Diagnosis present

## 2024-11-25 DIAGNOSIS — J441 Chronic obstructive pulmonary disease with (acute) exacerbation: Secondary | ICD-10-CM | POA: Diagnosis present

## 2024-11-25 DIAGNOSIS — Z8249 Family history of ischemic heart disease and other diseases of the circulatory system: Secondary | ICD-10-CM

## 2024-11-25 DIAGNOSIS — Z8673 Personal history of transient ischemic attack (TIA), and cerebral infarction without residual deficits: Secondary | ICD-10-CM

## 2024-11-25 DIAGNOSIS — Z818 Family history of other mental and behavioral disorders: Secondary | ICD-10-CM

## 2024-11-25 DIAGNOSIS — A419 Sepsis, unspecified organism: Secondary | ICD-10-CM | POA: Diagnosis not present

## 2024-11-25 DIAGNOSIS — Z888 Allergy status to other drugs, medicaments and biological substances status: Secondary | ICD-10-CM

## 2024-11-25 DIAGNOSIS — E119 Type 2 diabetes mellitus without complications: Secondary | ICD-10-CM | POA: Diagnosis present

## 2024-11-25 DIAGNOSIS — Z1152 Encounter for screening for COVID-19: Secondary | ICD-10-CM

## 2024-11-25 DIAGNOSIS — J189 Pneumonia, unspecified organism: Secondary | ICD-10-CM | POA: Diagnosis not present

## 2024-11-25 LAB — CBC WITH DIFFERENTIAL/PLATELET
Abs Immature Granulocytes: 0.08 10*3/uL — ABNORMAL HIGH (ref 0.00–0.07)
Basophils Absolute: 0.1 10*3/uL (ref 0.0–0.1)
Basophils Relative: 0 %
Eosinophils Absolute: 0 10*3/uL (ref 0.0–0.5)
Eosinophils Relative: 0 %
HCT: 39.3 % (ref 36.0–46.0)
Hemoglobin: 13.2 g/dL (ref 12.0–15.0)
Immature Granulocytes: 1 %
Lymphocytes Relative: 11 %
Lymphs Abs: 1.6 10*3/uL (ref 0.7–4.0)
MCH: 31.1 pg (ref 26.0–34.0)
MCHC: 33.6 g/dL (ref 30.0–36.0)
MCV: 92.5 fL (ref 80.0–100.0)
Monocytes Absolute: 1.6 10*3/uL — ABNORMAL HIGH (ref 0.1–1.0)
Monocytes Relative: 11 %
Neutro Abs: 10.7 10*3/uL — ABNORMAL HIGH (ref 1.7–7.7)
Neutrophils Relative %: 77 %
Platelets: 431 10*3/uL — ABNORMAL HIGH (ref 150–400)
RBC: 4.25 MIL/uL (ref 3.87–5.11)
RDW: 12.7 % (ref 11.5–15.5)
WBC: 14 10*3/uL — ABNORMAL HIGH (ref 4.0–10.5)
nRBC: 0 % (ref 0.0–0.2)

## 2024-11-25 LAB — COMPREHENSIVE METABOLIC PANEL WITH GFR
ALT: 50 U/L — ABNORMAL HIGH (ref 0–44)
AST: 51 U/L — ABNORMAL HIGH (ref 15–41)
Albumin: 4 g/dL (ref 3.5–5.0)
Alkaline Phosphatase: 116 U/L (ref 38–126)
Anion gap: 14 (ref 5–15)
BUN: 12 mg/dL (ref 8–23)
CO2: 31 mmol/L (ref 22–32)
Calcium: 10.3 mg/dL (ref 8.9–10.3)
Chloride: 101 mmol/L (ref 98–111)
Creatinine, Ser: 0.71 mg/dL (ref 0.44–1.00)
GFR, Estimated: >60 mL/min
Glucose, Bld: 82 mg/dL (ref 70–99)
Potassium: 3.3 mmol/L — ABNORMAL LOW (ref 3.5–5.1)
Sodium: 146 mmol/L — ABNORMAL HIGH (ref 135–145)
Total Bilirubin: 0.5 mg/dL (ref 0.0–1.2)
Total Protein: 7.5 g/dL (ref 6.5–8.1)

## 2024-11-25 LAB — RESP PANEL BY RT-PCR (RSV, FLU A&B, COVID)  RVPGX2
Influenza A by PCR: NEGATIVE
Influenza B by PCR: NEGATIVE
Resp Syncytial Virus by PCR: NEGATIVE
SARS Coronavirus 2 by RT PCR: NEGATIVE

## 2024-11-25 LAB — GLUCOSE, CAPILLARY: Glucose-Capillary: 175 mg/dL — ABNORMAL HIGH (ref 70–99)

## 2024-11-25 MED ORDER — SODIUM CHLORIDE 0.9 % IV SOLN
100.0000 mg | Freq: Once | INTRAVENOUS | Status: AC
  Administered 2024-11-25: 100 mg via INTRAVENOUS
  Filled 2024-11-25: qty 100

## 2024-11-25 MED ORDER — ONDANSETRON HCL 4 MG/2ML IJ SOLN: 4.0000 mg | Freq: Four times a day (QID) | INTRAMUSCULAR | Status: DC | PRN

## 2024-11-25 MED ORDER — PREDNISONE 20 MG PO TABS
40.0000 mg | ORAL_TABLET | Freq: Every day | ORAL | Status: DC
  Administered 2024-11-26: 40 mg via ORAL
  Filled 2024-11-25 (×2): qty 2

## 2024-11-25 MED ORDER — DILTIAZEM HCL 25 MG/5ML IV SOLN
5.0000 mg | Freq: Once | INTRAVENOUS | Status: AC
  Administered 2024-11-25: 5 mg via INTRAVENOUS
  Filled 2024-11-25: qty 5

## 2024-11-25 MED ORDER — IOHEXOL 300 MG/ML  SOLN
80.0000 mL | Freq: Once | INTRAMUSCULAR | Status: AC | PRN
  Administered 2024-11-25: 80 mL via INTRAVENOUS

## 2024-11-25 MED ORDER — FAMOTIDINE 20 MG PO TABS
40.0000 mg | ORAL_TABLET | Freq: Every day | ORAL | Status: DC
  Administered 2024-11-26: 40 mg via ORAL
  Filled 2024-11-25: qty 2

## 2024-11-25 MED ORDER — POTASSIUM CHLORIDE CRYS ER 20 MEQ PO TBCR
40.0000 meq | EXTENDED_RELEASE_TABLET | ORAL | Status: AC
  Administered 2024-11-25: 40 meq via ORAL
  Filled 2024-11-25 (×2): qty 2

## 2024-11-25 MED ORDER — METHYLPREDNISOLONE SODIUM SUCC 125 MG IJ SOLR
125.0000 mg | Freq: Once | INTRAMUSCULAR | Status: AC
  Administered 2024-11-25: 125 mg via INTRAVENOUS
  Filled 2024-11-25: qty 2

## 2024-11-25 MED ORDER — ACETAMINOPHEN 650 MG RE SUPP
650.0000 mg | Freq: Four times a day (QID) | RECTAL | Status: DC | PRN
  Administered 2024-11-27: 650 mg via RECTAL
  Filled 2024-11-25: qty 1

## 2024-11-25 MED ORDER — ONDANSETRON HCL 4 MG PO TABS: 4.0000 mg | ORAL_TABLET | Freq: Four times a day (QID) | ORAL | Status: DC | PRN

## 2024-11-25 MED ORDER — ATORVASTATIN CALCIUM 80 MG PO TABS
80.0000 mg | ORAL_TABLET | Freq: Every day | ORAL | Status: DC
  Administered 2024-11-26: 80 mg via ORAL
  Filled 2024-11-25: qty 1

## 2024-11-25 MED ORDER — DOXYCYCLINE HYCLATE 100 MG PO TABS
100.0000 mg | ORAL_TABLET | Freq: Two times a day (BID) | ORAL | Status: DC
  Filled 2024-11-25: qty 1

## 2024-11-25 MED ORDER — SERTRALINE HCL 100 MG PO TABS
100.0000 mg | ORAL_TABLET | Freq: Every day | ORAL | Status: DC
  Administered 2024-11-26: 100 mg via ORAL
  Filled 2024-11-25: qty 1

## 2024-11-25 MED ORDER — DILTIAZEM HCL 30 MG PO TABS
30.0000 mg | ORAL_TABLET | Freq: Four times a day (QID) | ORAL | Status: DC
  Administered 2024-11-26 (×2): 30 mg via ORAL
  Filled 2024-11-25 (×5): qty 1

## 2024-11-25 MED ORDER — LORAZEPAM 1 MG PO TABS
0.5000 mg | ORAL_TABLET | Freq: Three times a day (TID) | ORAL | Status: DC | PRN
  Administered 2024-11-26: 0.5 mg via ORAL
  Filled 2024-11-25: qty 1

## 2024-11-25 MED ORDER — IPRATROPIUM-ALBUTEROL 0.5-2.5 (3) MG/3ML IN SOLN
3.0000 mL | Freq: Four times a day (QID) | RESPIRATORY_TRACT | Status: DC
  Administered 2024-11-26 (×3): 3 mL via RESPIRATORY_TRACT
  Filled 2024-11-25 (×3): qty 3

## 2024-11-25 MED ORDER — IPRATROPIUM-ALBUTEROL 0.5-2.5 (3) MG/3ML IN SOLN
3.0000 mL | RESPIRATORY_TRACT | Status: AC
  Administered 2024-11-25: 3 mL via RESPIRATORY_TRACT
  Filled 2024-11-25: qty 3

## 2024-11-25 MED ORDER — ACETAMINOPHEN 500 MG PO TABS: 1000.0000 mg | ORAL_TABLET | Freq: Four times a day (QID) | ORAL | Status: DC | PRN

## 2024-11-25 MED ORDER — MORPHINE SULFATE (PF) 2 MG/ML IV SOLN
2.0000 mg | Freq: Once | INTRAVENOUS | Status: AC
  Administered 2024-11-25: 2 mg via INTRAVENOUS
  Filled 2024-11-25: qty 1

## 2024-11-25 MED ORDER — LEVOTHYROXINE SODIUM 25 MCG PO TABS
125.0000 ug | ORAL_TABLET | Freq: Every day | ORAL | Status: DC
  Administered 2024-11-26: 125 ug via ORAL
  Filled 2024-11-25: qty 1

## 2024-11-25 MED ORDER — SODIUM CHLORIDE 0.9 % IV SOLN
500.0000 mg | Freq: Once | INTRAVENOUS | Status: DC
  Filled 2024-11-25: qty 5

## 2024-11-25 MED ORDER — SODIUM CHLORIDE 0.9 % IV SOLN
1.0000 g | INTRAVENOUS | Status: DC
  Filled 2024-11-25: qty 10

## 2024-11-25 MED ORDER — INSULIN ASPART 100 UNIT/ML IJ SOLN: 0.0000 [IU] | Freq: Every day | INTRAMUSCULAR | Status: DC

## 2024-11-25 MED ORDER — INSULIN ASPART 100 UNIT/ML IJ SOLN: 0.0000 [IU] | Freq: Three times a day (TID) | INTRAMUSCULAR | Status: DC

## 2024-11-25 MED ORDER — ONDANSETRON HCL 4 MG/2ML IJ SOLN
4.0000 mg | Freq: Once | INTRAMUSCULAR | Status: AC
  Administered 2024-11-25: 4 mg via INTRAVENOUS
  Filled 2024-11-25: qty 2

## 2024-11-25 MED ORDER — APIXABAN 5 MG PO TABS
5.0000 mg | ORAL_TABLET | Freq: Two times a day (BID) | ORAL | Status: DC
  Administered 2024-11-26: 5 mg via ORAL
  Filled 2024-11-25: qty 1

## 2024-11-25 MED ORDER — SODIUM CHLORIDE 0.9 % IV SOLN
1.0000 g | Freq: Once | INTRAVENOUS | Status: AC
  Administered 2024-11-25: 1 g via INTRAVENOUS
  Filled 2024-11-25: qty 10

## 2024-11-25 MED ORDER — IPRATROPIUM-ALBUTEROL 0.5-2.5 (3) MG/3ML IN SOLN
3.0000 mL | Freq: Once | RESPIRATORY_TRACT | Status: AC
  Administered 2024-11-25: 3 mL via RESPIRATORY_TRACT
  Filled 2024-11-25: qty 3

## 2024-11-25 MED ORDER — SODIUM CHLORIDE 0.9 % IV BOLUS
1000.0000 mL | Freq: Once | INTRAVENOUS | Status: AC
  Administered 2024-11-25: 1000 mL via INTRAVENOUS

## 2024-11-25 NOTE — ED Notes (Signed)
" °   11/25/24 1256  Oxygen Therapy/Pulse Ox  O2 Device (S)  Room Air (RT applied 2 L [place], Sp02 increased to 93%.)  SpO2 (!) 87 %  O2 Therapy Room air    "

## 2024-11-25 NOTE — ED Notes (Signed)
" °   11/25/24 1507  Oxygen Therapy/Pulse Ox  O2 Device Nasal Cannula  O2 Therapy Oxygen  O2 Flow Rate (L/min) 2 L/min (increased to 3 L [place], SP02 increased to 94%)  FiO2 (%) 28 %   89% on 2 L [place], inspiratory, expiratory wheezing bilateral, Duoneb given.  "

## 2024-11-25 NOTE — Assessment & Plan Note (Signed)
 Continue zoloft 

## 2024-11-25 NOTE — Assessment & Plan Note (Signed)
 Has some lingular infiltrates on CT chest. Agree with radiologist that it looks like old disease. Change to prednisone  40 mg daily. Duonebs qid. Po doxycycline . Pt quit smoking 13 years ago. Pt did state she had a cough about 2 days ago but no fevers or chills. Did have some productive green sputum.

## 2024-11-25 NOTE — ED Provider Notes (Signed)
 " [place] EMERGENCY DEPARTMENT AT [facility] Provider Note   CSN: [id number] Arrival date & time: 11/25/24  1207     Patient presents with: Stefanie Carter is a 79 y.o. female.  {Add pertinent medical, surgical, social history, OB history to HPI:32947} 79 yo F history of a fall.  She said she fell yesterday.  She slipped on the ice and that was the cause of her injury.  Complaining of pain mostly to the left side of her body.  She has been coughing and congested for about 48 hours.  No known sick contacts.  No reported fevers.   Fall       Prior to Admission medications  Medication Sig Start Date End Date Taking? Authorizing Provider  acetaminophen  (TYLENOL ) 325 MG tablet Take 325 mg by mouth every 6 (six) hours as needed for moderate pain (pain score 4-6).    [provider]  atorvastatin  (LIPITOR ) 80 MG tablet Take 1 tablet (80 mg total) by mouth daily. 11/10/23   Hongalgi, Anand D, MD  Budeson-Glycopyrrol-Formoterol  (BREZTRI AEROSPHERE) 160-9-4.8 MCG/ACT AERO Inhale 2 puffs into the lungs in the morning and at bedtime.    [provider]  caffeine 200 MG TABS tablet Take 200 mg by mouth daily as needed.    [provider]  clopidogrel  (PLAVIX ) 75 MG tablet 1 tablet Orally Once a day; Duration: 90 days 12/30/23   [provider]  Coenzyme Q10 (CO Q10) 100 MG CAPS Take 1 capsule by mouth daily.    [provider]  diclofenac  Sodium (VOLTAREN  ARTHRITIS PAIN) 1 % GEL Apply 2 g topically 4 (four) times daily as needed (apply to neck and shoulder for headache and pain). 04/27/24   Emeline Joesph BROCKS, DO  famotidine  (PEPCID ) 40 MG tablet Take 1 tablet (40 mg total) by mouth daily. 11/09/23   Hongalgi, Anand D, MD  gabapentin  (NEURONTIN ) 100 MG capsule 1 capsule at bedtime Orally Once a day    [provider]  guaiFENesin (MUCINEX) 600 MG 12 hr tablet Take 300 mg by mouth daily.    [provider]   incobotulinumtoxinA  (XEOMIN) 100 units SOLR injection Inject 100 Units into the muscle every 3 (three) months. 11/23/24   Onita Duos, MD  ipratropium-albuterol  (DUONEB) 0.5-2.5 (3) MG/3ML SOLN Take 3 mLs by nebulization every 6 (six) hours as needed.    [provider]  levothyroxine  (SYNTHROID ) 125 MCG tablet Take 1 tablet (125 mcg total) by mouth daily before breakfast. 11/09/23   Hongalgi, Anand D, MD  loratadine (CLARITIN) 10 MG tablet Take 10 mg by mouth daily as needed for allergies.    [provider]  LORazepam  (ATIVAN ) 0.5 MG tablet Take 0.5 mg by mouth every 8 (eight) hours as needed. 04/09/23   [provider]  Magnesium  200 MG TABS Take 100 mg by mouth daily.    [provider]  meclizine  (ANTIVERT ) 25 MG tablet Take 1 tablet (25 mg total) by mouth 3 (three) times daily as needed for dizziness. 11/09/23   Hongalgi, Anand D, MD  methocarbamol  (ROBAXIN ) 500 MG tablet Take 0.5-1 tablets (250-500 mg total) by mouth every 8 (eight) hours as needed for muscle spasms (neck pain and tightness). 09/07/24   Emeline Joesph C, DO  MULTIPLE VITAMIN PO Take 1 tablet by mouth daily.    [provider]  NURTEC 75 MG TBDP Take 1 tablet by mouth daily as needed. Patient not taking: Reported on 11/02/2024  [provider]  OZEMPIC, 0.25 OR 0.5 MG/DOSE, 2 MG/3ML SOPN INJECT 0.25 MG SUBCUTANEOUSLY ONCE A WEEK FOR 4 WEEKS, THEN 0.5 MG SUBCUTANEOUSLY ONCE A WEEK.    [provider]  senna (SENOKOT) 8.6 MG tablet Take 100 mg by mouth daily.    [provider]  sertraline  (ZOLOFT ) 100 MG tablet Take 100 mg by mouth daily. 06/11/21   [provider]  VITAMIN D, CHOLECALCIFEROL, PO Take 1 tablet by mouth daily.    [provider]  vitamin E 200 UNIT capsule Take 200 Units by mouth daily.    [provider]    Allergies: Other, Penicillins, Metformin hcl, Penicillin g benzathine, Sulfa antibiotics,  Sulfamethoxazole-trimethoprim, and Varenicline    Review of Systems  Updated Vital Signs BP 130/72   Pulse (!) 50   Temp 98.4 F (36.9 C) (Oral)   Resp (!) 24   SpO2 92%   Physical Exam Vitals and nursing note reviewed.  Constitutional:      General: She is not in acute distress.    Appearance: She is well-developed. She is not diaphoretic.     Comments: Chronically ill-appearing.  Barrel chest.  HENT:     Head: Normocephalic and atraumatic.  Eyes:     Pupils: Pupils are equal, round, and reactive to light.  Cardiovascular:     Rate and Rhythm: Normal rate and regular rhythm.     Heart sounds: No murmur heard.    No friction rub. No gallop.  Pulmonary:     Effort: Pulmonary effort is normal.     Breath sounds: No wheezing or rales.     Comments: Coarse breath sounds in all fields. Abdominal:     General: There is no distension.     Palpations: Abdomen is soft.     Tenderness: There is no abdominal tenderness.  Musculoskeletal:        General: No tenderness.     Cervical back: Normal range of motion and neck supple.  Skin:    General: Skin is warm and dry.  Neurological:     Mental Status: She is alert and oriented to person, place, and time.  Psychiatric:        Behavior: Behavior normal.     (all labs ordered are listed, but only abnormal results are displayed) Labs Reviewed  CBC WITH DIFFERENTIAL/PLATELET - Abnormal; Notable for the following components:      Result Value   WBC 14.0 (*)    Platelets 431 (*)    Neutro Abs 10.7 (*)    Monocytes Absolute 1.6 (*)    Abs Immature Granulocytes 0.08 (*)    All other components within normal limits  COMPREHENSIVE METABOLIC PANEL WITH GFR - Abnormal; Notable for the following components:   Sodium 146 (*)    Potassium 3.3 (*)    AST 51 (*)    ALT 50 (*)    All other components within normal limits    EKG: None  Radiology: No results found.  {Document cardiac monitor, telemetry assessment procedure when  appropriate:32947} Procedures   Medications Ordered in the ED  sodium chloride  0.9 % bolus 1,000 mL (1,000 mLs Intravenous New Bag/Given 11/25/24 1339)  morphine  (PF) 2 MG/ML injection 2 mg (2 mg Intravenous Given 11/25/24 1339)  ondansetron  (ZOFRAN ) injection 4 mg (4 mg Intravenous Given 11/25/24 1339)      {Click here for ABCD2, HEART and other calculators REFRESH Note before signing:1}  Medical Decision Making Amount and/or Complexity of Data Reviewed Labs: ordered. Radiology: ordered.  Risk Prescription drug management.   79 yo F with a chief complaint of fall.  This happened yesterday.  She has been sick cough congestion going on for couple days.  She is complaining of diffuse pain and appears very uncomfortable initial exam.  Tachypneic.  {Document critical care time when appropriate  Document review of labs and clinical decision tools ie CHADS2VASC2, etc  Document your independent review of radiology images and any outside records  Document your discussion with family members, caretakers and with consultants  Document social determinants of health affecting pt's care  Document your decision making why or why not admission, treatments were needed:32947:::1}   Final diagnoses:  None    ED Discharge Orders     None        "

## 2024-11-25 NOTE — Assessment & Plan Note (Signed)
 Likely from COPD exacerbation after her fall. Pt does not normally use home O2. Continue with supplemental O2. Wean as tolerated to keep O2 sats >92%

## 2024-11-25 NOTE — ED Triage Notes (Signed)
 Slip fall on ice yesterday around 4pm Hit back of head Denies LOC Pain on left side chest

## 2024-11-25 NOTE — Assessment & Plan Note (Addendum)
 Hold ozempic. Add SSI. Check a1c

## 2024-11-25 NOTE — Assessment & Plan Note (Signed)
 Pt starting on low dose cardizem  for her rapid afib.

## 2024-11-25 NOTE — H&P (Addendum)
 " History and Physical    Stefanie Carter FMW:[id number] DOB: 19-Dec-1945 DOA: 11/25/2024  DOS: the patient was seen and examined on 11/25/2024  PCP: Stefanie Other, MD   Referring Provider: dreama, MD Telemedicine Provider: Camellia Door, DO Provider Location: Stefanie Carter, [place] Patient Location: [facility] ER Referring Diagnosis: COPD exacerbation, hypoxia Patient Name and DOB verified: yes Patient consented to Telemedicine Evaluation:yes RN virtual assistant: Stefanie Cleveland, RN Video encounter time and date:11/25/24 @ 5:30 pm   Patient coming from: Home  I have personally briefly reviewed patient's old medical records in [facility]  CC: fall yesterday, hit head, pain on left side of chest HPI: 79 yo WF with prior hx of COPD but no hx of CHF, presents to ER after a fall yesterday. Pt fell on ice. She hit her head.  Her neighbor Stefanie Carter and his wife came over to check on the patient.  She refused to go to the ER last night.  She was agreeable to coming to the ER today for evaluation.  Came to ER for evaluation. Noted to be hypoxic with RA sats of 87% in ER Triage.  She is noted to be wheezing.  Patient states that she did have a cough about 2 days prior to falling down.  This was occasionally productive.  No fevers.  She does not use home oxygen.  She quit smoking about 13 years ago.  She has never had a history of rapid atrial fibrillation.  She is on Plavix  for prior stroke.  She states that Plavix  makes her skin very thin.  She bruises very easily.  She does not have any chest pain.  She recently started on Ozempic for diabetes.  She is agreeable to coming into the hospital for evaluation of her COPD, hypoxia and rapid A-fib.  [facility] consulted for admission.  CT chest abdomen pelvis was negative for any trauma.  They does not have some lingular infiltrates but this was thought to be chronic.  Labs showed a white count of 14.0, hemoglobin 13.2, platelets of  431  Sodium 146, potassium 3.3, bicarb 31, BUN of 12, creatinine 0.71, glucose 82  Total protein 7.5, albumin 4.0, AST 51, ALT 50, alk phos 116, total 0.5  Significant Events: Admitted 11/25/2024 for acute respiratory failure with hypoxia   Admission Labs: white count of 14.0, hemoglobin 13.2, platelets of 431 Sodium 146, potassium 3.3, bicarb 31, BUN of 12, creatinine 0.71, glucose 82 Total protein 7.5, albumin 4.0, AST 51, ALT 50, alk phos 116, total 0.5  Admission Imaging Studies: CXR Pulmonary vascular congestion without overt edema. 2. Mild enlargement of the cardiac silhouette CT head No acute intracranial process.  CT c-spine No acute cervical spine fracture. 2. Stable severe multil CT chest/abd/pelvis No acute intrathoracic, intra-abdominal, or intrapelvic trauma. 2. Mild bilateral bronchiectasis and bronchial wall thickening, with peripheral areas of consolidation within the lingula and left lower lobe as above. Overall, findings favor sequela of chronic indolent infection, chronic bronchitis, or recurrent aspiration. 3.  Aortic Atherosclerosis  Significant Labs:   Significant Imaging Studies:   Antibiotic Therapy: Anti-infectives (From admission, onward)    Start     Dose/Rate Route Frequency Ordered Stop   11/25/24 1700  doxycycline  (VIBRAMYCIN ) 100 mg in sodium chloride  0.9 % 250 mL IVPB        100 mg 125 mL/hr over 120 Minutes Intravenous  Once 11/25/24 1659     11/25/24 1630  cefTRIAXone  (ROCEPHIN ) 1 g in sodium chloride  0.9 % 100  mL IVPB        1 g 200 mL/hr over 30 Minutes Intravenous  Once 11/25/24 1624     11/25/24 1630  azithromycin  (ZITHROMAX ) 500 mg in sodium chloride  0.9 % 250 mL IVPB  Status:  Discontinued        500 mg 250 mL/hr over 60 Minutes Intravenous  Once 11/25/24 1624 11/25/24 1659       Procedures:   Consultants:     ED Course: CT head negative for fracture/stroke. CT chest/abd/pelvis shows ??lingular infilrate vs old indolent  infection. Started on O2 for RA sats of 87%. Given nebs and IV solumedrol  Review of Systems:  Review of Systems  Constitutional:  Negative for chills and fever.  HENT: Negative.    Eyes: Negative.   Respiratory:  Positive for cough and sputum production.   Cardiovascular: Negative.   Gastrointestinal: Negative.   Genitourinary: Negative.   Musculoskeletal:  Positive for falls.  Skin: Negative.   Neurological:  Positive for headaches.  Endo/Heme/Allergies:  Bruises/bleeds easily.  Psychiatric/Behavioral: Negative.    All Carter systems reviewed and are negative.   Past Medical History:  Diagnosis Date   Acute ischemic stroke (HCC) 11/07/2023   Anxiety    Cerebellar stroke (HCC) 12/01/2023   Diabetes (HCC)    Heart failure (HCC) 10/18/2024   HLD (hyperlipidemia)    HTN (hypertension)    Hypothyroid    Spinal stenosis     Past Surgical History:  Procedure Laterality Date   APPENDECTOMY     SALIVARY GLAND SURGERY     TONSILLECTOMY       reports that she quit smoking about 13 years ago. Her smoking use included cigarettes. She started smoking about 43 years ago. She has a 30 pack-year smoking history. She has never used smokeless tobacco. She reports current alcohol  use. She reports that she does not use drugs.  Allergies[1]  Family History  Problem Relation Age of Onset   Heart disease Mother    COPD Mother    Kidney disease Father    Depression Father    Glaucoma Father    Bladder Cancer Neg Hx    Renal cancer Neg Hx    Uterine cancer Neg Hx     Prior to Admission medications  Medication Sig Start Date End Date Taking? Authorizing Provider  acetaminophen  (TYLENOL ) 325 MG tablet Take 325 mg by mouth every 6 (six) hours as needed for moderate pain (pain score 4-6).    [provider]  atorvastatin  (LIPITOR ) 80 MG tablet Take 1 tablet (80 mg total) by mouth daily. 11/10/23   Hongalgi, Anand D, MD  Budeson-Glycopyrrol-Formoterol  (BREZTRI AEROSPHERE)  160-9-4.8 MCG/ACT AERO Inhale 2 puffs into the lungs in the morning and at bedtime.    [provider]  caffeine 200 MG TABS tablet Take 200 mg by mouth daily as needed.    [provider]  clopidogrel  (PLAVIX ) 75 MG tablet 1 tablet Orally Once a day; Duration: 90 days 12/30/23   [provider]  Coenzyme Q10 (CO Q10) 100 MG CAPS Take 1 capsule by mouth daily.    [provider]  diclofenac  Sodium (VOLTAREN  ARTHRITIS PAIN) 1 % GEL Apply 2 g topically 4 (four) times daily as needed (apply to neck and shoulder for headache and pain). 04/27/24   Emeline Joesph BROCKS, DO  famotidine  (PEPCID ) 40 MG tablet Take 1 tablet (40 mg total) by mouth daily. 11/09/23   Hongalgi, Anand D, MD  gabapentin  (NEURONTIN ) 100 MG capsule  1 capsule at bedtime Orally Once a day    [provider]  guaiFENesin (MUCINEX) 600 MG 12 hr tablet Take 300 mg by mouth daily.    [provider]  incobotulinumtoxinA  NANCI) 100 units SOLR injection Inject 100 Units into the muscle every 3 (three) months. 11/23/24   Onita Duos, MD  ipratropium-albuterol  (DUONEB) 0.5-2.5 (3) MG/3ML SOLN Take 3 mLs by nebulization every 6 (six) hours as needed.    [provider]  levothyroxine  (SYNTHROID ) 125 MCG tablet Take 1 tablet (125 mcg total) by mouth daily before breakfast. 11/09/23   Hongalgi, Anand D, MD  loratadine (CLARITIN) 10 MG tablet Take 10 mg by mouth daily as needed for allergies.    [provider]  LORazepam  (ATIVAN ) 0.5 MG tablet Take 0.5 mg by mouth every 8 (eight) hours as needed. 04/09/23   [provider]  Magnesium  200 MG TABS Take 100 mg by mouth daily.    [provider]  meclizine  (ANTIVERT ) 25 MG tablet Take 1 tablet (25 mg total) by mouth 3 (three) times daily as needed for dizziness. 11/09/23   Hongalgi, Anand D, MD  methocarbamol  (ROBAXIN ) 500 MG tablet Take 0.5-1 tablets (250-500 mg total) by mouth every 8 (eight) hours as needed for muscle  spasms (neck pain and tightness). 09/07/24   Emeline Search C, DO  MULTIPLE VITAMIN PO Take 1 tablet by mouth daily.    [provider]  NURTEC 75 MG TBDP Take 1 tablet by mouth daily as needed. Patient not taking: Reported on 11/02/2024    [provider]  OZEMPIC, 0.25 OR 0.5 MG/DOSE, 2 MG/3ML SOPN INJECT 0.25 MG SUBCUTANEOUSLY ONCE A WEEK FOR 4 WEEKS, THEN 0.5 MG SUBCUTANEOUSLY ONCE A WEEK.    [provider]  senna (SENOKOT) 8.6 MG tablet Take 100 mg by mouth daily.    [provider]  sertraline  (ZOLOFT ) 100 MG tablet Take 100 mg by mouth daily. 06/11/21   [provider]  VITAMIN D, CHOLECALCIFEROL, PO Take 1 tablet by mouth daily.    [provider]  vitamin E 200 UNIT capsule Take 200 Units by mouth daily.    [provider]    Physical Exam: Vitals:   11/25/24 1500 11/25/24 1530 11/25/24 1600 11/25/24 1700  BP: 116/80 127/87 128/76   Pulse: (!) 138 (!) 107 (!) 40   Resp:  (!) 28 18   Temp:    98.4 F (36.9 C)  TempSrc:    Oral  SpO2: (!) 87% 92% 92%    Physical exam performed by RN Stefanie Carter. Recorded in this H&P for clarity and completeness.  Physical Exam Vitals and nursing note reviewed.  Constitutional:      General: She is not in acute distress.    Appearance: She is not toxic-appearing or diaphoretic.  HENT:     Head: Normocephalic and atraumatic.  Eyes:     General: No scleral icterus. Cardiovascular:     Rate and Rhythm: Tachycardia present. Rhythm irregular.  Pulmonary:     Breath sounds: Wheezing present.     Comments: End expiratory wheezing in lower lobes Abdominal:     General: Bowel sounds are normal. There is no distension.     Palpations: Abdomen is soft.  Musculoskeletal:     Right lower leg: No edema.     Left lower leg: No edema.  Neurological:     Mental Status: She is alert and oriented to person, place, and time.  Labs on Admission: I have personally reviewed  following labs and imaging studies  CBC: Recent Labs  Lab 11/25/24 1344  WBC 14.0*  NEUTROABS 10.7*  HGB 13.2  HCT 39.3  MCV 92.5  PLT 431*   Basic Metabolic Panel: Recent Labs  Lab 11/25/24 1344  NA 146*  K 3.3*  CL 101  CO2 31  GLUCOSE 82  BUN 12  CREATININE 0.71  CALCIUM  10.3   GFR: CrCl cannot be calculated (Unknown ideal weight.). Liver Function Tests: Recent Labs  Lab 11/25/24 1344  AST 51*  ALT 50*  ALKPHOS 116  BILITOT 0.5  PROT 7.5  ALBUMIN 4.0    Radiological Exams on Admission: I have personally reviewed images CT CHEST ABDOMEN PELVIS W CONTRAST Result Date: 11/25/2024 CLINICAL DATA:  Clemens, blunt trauma EXAM: CT CHEST, ABDOMEN, AND PELVIS WITH CONTRAST TECHNIQUE: Multidetector CT imaging of the chest, abdomen and pelvis was performed following the standard protocol during bolus administration of intravenous contrast. RADIATION DOSE REDUCTION: This exam was performed according to the departmental dose-optimization program which includes automated exposure control, adjustment of the mA and/or kV according to patient size and/or use of iterative reconstruction technique. CONTRAST:  80mL OMNIPAQUE  IOHEXOL  300 MG/ML  SOLN COMPARISON:  01/02/2022 FINDINGS: CT CHEST FINDINGS Cardiovascular: The heart is unremarkable without pericardial effusion. Moderate calcification of the mitral annulus. No evidence of thoracic aortic aneurysm or dissection. Atherosclerosis of the aorta, great vessels, and coronary vasculature. Mediastinum/Nodes: No enlarged mediastinal, hilar, or axillary lymph nodes. Thyroid  gland, trachea, and esophagus demonstrate no significant findings. Lungs/Pleura: There are scattered areas of peripheral consolidation within the lingula and left lower lobe. There is mild bilateral bronchiectasis and bronchial wall thickening. No effusion or pneumothorax. Central airways are patent. Musculoskeletal: No acute or destructive bony abnormalities. Reconstructed  images demonstrate no additional findings. CT ABDOMEN PELVIS FINDINGS Hepatobiliary: No hepatic injury or perihepatic hematoma. Gallbladder is unremarkable. Pancreas: Unremarkable. No pancreatic ductal dilatation or surrounding inflammatory changes. Spleen: No splenic injury or perisplenic hematoma. Adrenals/Urinary Tract: No adrenal hemorrhage or renal injury identified. Bladder is unremarkable. Stomach/Bowel: No bowel obstruction or ileus. Prior appendectomy. No bowel wall thickening or inflammatory change. Vascular/Lymphatic: Aortic atherosclerosis. No enlarged abdominal or pelvic lymph nodes. Reproductive: The uterus is atrophic, with multiple calcified uterine fibroids identified. A pessary is in place. No adnexal masses. Carter: No free fluid or free intraperitoneal gas. No abdominal wall hernia. Musculoskeletal: No acute or destructive bony abnormalities. Reconstructed images demonstrate no additional findings. IMPRESSION: 1. No acute intrathoracic, intra-abdominal, or intrapelvic trauma. 2. Mild bilateral bronchiectasis and bronchial wall thickening, with peripheral areas of consolidation within the lingula and left lower lobe as above. Overall, findings favor sequela of chronic indolent infection, chronic bronchitis, or recurrent aspiration. 3.  Aortic Atherosclerosis (ICD10-I70.0). Electronically Signed   By: Ozell Daring M.D.   On: 11/25/2024 15:20   CT Head Wo Contrast Result Date: 11/25/2024 CLINICAL DATA:  Clemens, head trauma EXAM: CT HEAD WITHOUT CONTRAST TECHNIQUE: Contiguous axial images were obtained from the base of the skull through the vertex without intravenous contrast. RADIATION DOSE REDUCTION: This exam was performed according to the departmental dose-optimization program which includes automated exposure control, adjustment of the mA and/or kV according to patient size and/or use of iterative reconstruction technique. COMPARISON:  12/07/2023 FINDINGS: Brain: No acute infarct or  hemorrhage. The lateral ventricles and midline structures are unremarkable. No acute extra-axial fluid collections. No mass effect. Vascular: No hyperdense vessel or unexpected calcification. Skull: Normal. Negative for fracture or  focal lesion. Sinuses/Orbits: No acute finding. Carter: None. IMPRESSION: 1. No acute intracranial process. Electronically Signed   By: Ozell Daring M.D.   On: 11/25/2024 15:11   CT Cervical Spine Wo Contrast Result Date: 11/25/2024 CLINICAL DATA:  Neck trauma, fell yesterday EXAM: CT CERVICAL SPINE WITHOUT CONTRAST TECHNIQUE: Multidetector CT imaging of the cervical spine was performed without intravenous contrast. Multiplanar CT image reconstructions were also generated. RADIATION DOSE REDUCTION: This exam was performed according to the departmental dose-optimization program which includes automated exposure control, adjustment of the mA and/or kV according to patient size and/or use of iterative reconstruction technique. COMPARISON:  08/18/2023, 11/07/2023 FINDINGS: Alignment: Degenerative retrolisthesis of C4 relative to C3, and C5 relative to C4 is unchanged since prior study. Skull base and vertebrae: Stable degenerative geode centered within the odontoid process of C2, unchanged since prior exam. There are no acute displaced fractures. No new bony lesions. Soft tissues and spinal canal: No prevertebral fluid or swelling. No visible canal hematoma. Disc levels: Severe degenerative changes at the C1-C2 interval are again noted, with large pannus and cystic degenerative change within the odontoid process stable since prior study. Multilevel spondylosis most pronounced at C4-5, C5-6, and C6-7 unchanged. Diffuse facet hypertrophic changes are stable. Upper chest: Airway is patent.  Lung apices are clear. Carter: Reconstructed images demonstrate no additional findings. IMPRESSION: 1. No acute cervical spine fracture. 2. Stable severe multilevel cervical degenerative changes, most  pronounced at C1-C2. Electronically Signed   By: Ozell Daring M.D.   On: 11/25/2024 15:09   DG Chest Port 1 View Result Date: 11/25/2024 CLINICAL DATA:  Cough, short of breath, fell yesterday EXAM: PORTABLE CHEST 1 VIEW COMPARISON:  11/07/2023 FINDINGS: Single frontal view of the chest demonstrates mild enlargement of the cardiac silhouette. There is increased pulmonary vascular congestion without focal airspace disease, effusion, or pneumothorax. There are no acute displaced fractures. IMPRESSION: 1. Pulmonary vascular congestion without overt edema. 2. Mild enlargement of the cardiac silhouette. Electronically Signed   By: Ozell Daring M.D.   On: 11/25/2024 14:53    EKG: My personal interpretation of EKG shows: rapid afib    Assessment/Plan Principal Problem:   Acute respiratory failure with hypoxia (HCC) Active Problems:   COPD with acute exacerbation (HCC)   Rapid atrial fibrillation (HCC)   Essential hypertension   Hypokalemia   DM2 (diabetes mellitus, type 2) (HCC)   Acquired hypothyroidism   Moderate recurrent major depression (HCC)    Assessment and Plan: * Acute respiratory failure with hypoxia (HCC) Likely from COPD exacerbation after her fall. Pt does not normally use home O2. Continue with supplemental O2. Wean as tolerated to keep O2 sats >92%  Rapid atrial fibrillation (HCC) New onset afib. No prior hx of afib. Pt agreeable to stopping plavix  she was taking for her prior CVA. Change to Eliquis  5 mg bid. Check echo. Check TSH. Start low dose cardizem  30 mg q6h. Her prior echo in Jan 2025 showed LVEF 60-65%  COPD with acute exacerbation (HCC) Has some lingular infiltrates on CT chest. Agree with radiologist that it looks like old disease. Change to prednisone  40 mg daily. Duonebs qid. Po doxycycline . Pt quit smoking 13 years ago. Pt did state she had a cough about 2 days ago but no fevers or chills. Did have some productive green sputum.  Moderate recurrent major  depression (HCC) Continue zoloft .  Acquired hypothyroidism Continue synthroid . Check TSH, FT4.  DM2 (diabetes mellitus, type 2) (HCC) Hold ozempic. Add SSI. Check a1c  Hypokalemia Keep K >4.0 and Mg >2.0  Essential hypertension Pt starting on low dose cardizem  for her rapid afib.   DVT prophylaxis: Eliquis  Code Status: Full Code Family Communication: discussed with pt's neighbor Stefanie Carter via video call  Disposition Plan: home  Consults called: none  Admission status: Observation, Telemetry bed   Stefanie Door, DO [facility] 11/25/2024, 5:46 PM       [1]  Allergies Allergen Reactions   Carter Carter (See Comments)    Substance with sulfonamide structure and antibacterial mechanism of action (substance)   Penicillins Anaphylaxis   Metformin Hcl Carter (See Comments)   Penicillin G Benzathine     Carter Reaction(s): unknown   Sulfa Antibiotics     Had as a baby; doesn't know reaction.   Sulfamethoxazole-Trimethoprim Carter (See Comments)    Carter reaction(s): Unknown  Carter Reaction(s): Not available  sulfamethoxazole / trimethoprim  Carter Reaction(s): unknown   Varenicline Carter (See Comments)    Carter reaction(s): intol  Carter Reaction(s): Not available  varenicline   "

## 2024-11-25 NOTE — Assessment & Plan Note (Signed)
 Keep K >4.0 and Mg >2.0

## 2024-11-25 NOTE — ED Notes (Signed)
 Pts friend asking to be call when pt is transported. Stefanie Carter [phone]

## 2024-11-25 NOTE — ED Provider Notes (Signed)
" °  Physical Exam  BP 127/87   Pulse (!) 107   Temp 98.4 F (36.9 C) (Oral)   Resp (!) 28   SpO2 92%   Physical Exam  Procedures  Procedures  ED Course / MDM    Medical Decision Making Amount and/or Complexity of Data Reviewed Labs: ordered. Radiology: ordered.  Risk Prescription drug management. Decision regarding hospitalization.   79 year old female presents with concern for fall, also notes cough and congestion.  Was found to be hypoxic, tachycardic with atrial fibrillation. No known history of atrial fibrillation.  (Has had hx of CVA, COPD)  Labs completed and personally by interpreted by me show leukocytosis, mild hypokalemia, mild transaminitis.  CT head, cervical spine without acute traumatic findings.  CT chest shows mild bilateral bronchiectasis, bronchial wall thickening, peripheral areas of consolidation within the lingula and left lower lobe favor indolent infection, chronic bronchitis or recurrent aspiration.  Will admit for hypoxia, new onset atrial fibrillation with rapid rate, suspected pneumonia, COPD.       Dreama Longs, MD 11/26/24 1259  "

## 2024-11-25 NOTE — Assessment & Plan Note (Signed)
 New onset afib. No prior hx of afib. Pt agreeable to stopping plavix  she was taking for her prior CVA. Change to Eliquis  5 mg bid. Check echo. Check TSH. Start low dose cardizem  30 mg q6h. Her prior echo in Jan 2025 showed LVEF 60-65%

## 2024-11-25 NOTE — Hospital Course (Signed)
 CC: fall yesterday, hit head, pain on left side of chest HPI: 79 yo WF with prior hx of COPD but no hx of CHF, presents to ER after a fall yesterday. Pt fell on ice. She hit her head.  Her neighbor [name] and his wife came over to check on the patient.  She refused to go to the ER last night.  She was agreeable to coming to the ER today for evaluation.  Came to ER for evaluation. Noted to be hypoxic with RA sats of 87% in ER Triage.  She is noted to be wheezing.  Patient states that she did have a cough about 2 days prior to falling down.  This was occasionally productive.  No fevers.  She does not use home oxygen.  She quit smoking about 13 years ago.  She has never had a history of rapid atrial fibrillation.  She is on Plavix  for prior stroke.  She states that Plavix  makes her skin very thin.  She bruises very easily.  She does not have any chest pain.  She recently started on Ozempic for diabetes.  She is agreeable to coming into the hospital for evaluation of her COPD, hypoxia and rapid A-fib.  [facility] consulted for admission.  CT chest abdomen pelvis was negative for any trauma.  They does not have some lingular infiltrates but this was thought to be chronic.  Labs showed a white count of 14.0, hemoglobin 13.2, platelets of 431  Sodium 146, potassium 3.3, bicarb 31, BUN of 12, creatinine 0.71, glucose 82  Total protein 7.5, albumin 4.0, AST 51, ALT 50, alk phos 116, total 0.5  Significant Events: Admitted 11/25/2024 for acute respiratory failure with hypoxia   Admission Labs: white count of 14.0, hemoglobin 13.2, platelets of 431 Sodium 146, potassium 3.3, bicarb 31, BUN of 12, creatinine 0.71, glucose 82 Total protein 7.5, albumin 4.0, AST 51, ALT 50, alk phos 116, total 0.5  Admission Imaging Studies: CXR Pulmonary vascular congestion without overt edema. 2. Mild enlargement of the cardiac silhouette CT head No acute intracranial process.  CT c-spine No acute  cervical spine fracture. 2. Stable severe multil CT chest/abd/pelvis No acute intrathoracic, intra-abdominal, or intrapelvic trauma. 2. Mild bilateral bronchiectasis and bronchial wall thickening, with peripheral areas of consolidation within the lingula and left lower lobe as above. Overall, findings favor sequela of chronic indolent infection, chronic bronchitis, or recurrent aspiration. 3.  Aortic Atherosclerosis  Significant Labs:   Significant Imaging Studies:   Antibiotic Therapy: Anti-infectives (From admission, onward)    Start     Dose/Rate Route Frequency Ordered Stop   11/25/24 1700  doxycycline  (VIBRAMYCIN ) 100 mg in sodium chloride  0.9 % 250 mL IVPB        100 mg 125 mL/hr over 120 Minutes Intravenous  Once 11/25/24 1659     11/25/24 1630  cefTRIAXone  (ROCEPHIN ) 1 g in sodium chloride  0.9 % 100 mL IVPB        1 g 200 mL/hr over 30 Minutes Intravenous  Once 11/25/24 1624     11/25/24 1630  azithromycin  (ZITHROMAX ) 500 mg in sodium chloride  0.9 % 250 mL IVPB  Status:  Discontinued        500 mg 250 mL/hr over 60 Minutes Intravenous  Once 11/25/24 1624 11/25/24 1659       Procedures:   Consultants:

## 2024-11-25 NOTE — ED Notes (Signed)
 [facility] in ED preparing pt for transfer

## 2024-11-25 NOTE — Assessment & Plan Note (Signed)
 Continue synthroid . Check TSH, FT4.

## 2024-11-26 ENCOUNTER — Observation Stay (HOSPITAL_COMMUNITY)

## 2024-11-26 ENCOUNTER — Other Ambulatory Visit: Payer: Self-pay

## 2024-11-26 ENCOUNTER — Encounter (HOSPITAL_COMMUNITY): Payer: Self-pay

## 2024-11-26 ENCOUNTER — Inpatient Hospital Stay (HOSPITAL_COMMUNITY)

## 2024-11-26 DIAGNOSIS — R0689 Other abnormalities of breathing: Secondary | ICD-10-CM | POA: Diagnosis not present

## 2024-11-26 DIAGNOSIS — J9621 Acute and chronic respiratory failure with hypoxia: Secondary | ICD-10-CM

## 2024-11-26 DIAGNOSIS — J9622 Acute and chronic respiratory failure with hypercapnia: Secondary | ICD-10-CM | POA: Diagnosis not present

## 2024-11-26 DIAGNOSIS — E039 Hypothyroidism, unspecified: Secondary | ICD-10-CM | POA: Diagnosis not present

## 2024-11-26 DIAGNOSIS — F32A Depression, unspecified: Secondary | ICD-10-CM | POA: Diagnosis not present

## 2024-11-26 DIAGNOSIS — Z87891 Personal history of nicotine dependence: Secondary | ICD-10-CM

## 2024-11-26 DIAGNOSIS — G929 Unspecified toxic encephalopathy: Secondary | ICD-10-CM | POA: Diagnosis not present

## 2024-11-26 DIAGNOSIS — J189 Pneumonia, unspecified organism: Secondary | ICD-10-CM | POA: Diagnosis not present

## 2024-11-26 DIAGNOSIS — E119 Type 2 diabetes mellitus without complications: Secondary | ICD-10-CM | POA: Diagnosis not present

## 2024-11-26 DIAGNOSIS — J9601 Acute respiratory failure with hypoxia: Secondary | ICD-10-CM | POA: Diagnosis present

## 2024-11-26 DIAGNOSIS — I10 Essential (primary) hypertension: Secondary | ICD-10-CM | POA: Diagnosis not present

## 2024-11-26 DIAGNOSIS — I4891 Unspecified atrial fibrillation: Secondary | ICD-10-CM | POA: Diagnosis not present

## 2024-11-26 DIAGNOSIS — J441 Chronic obstructive pulmonary disease with (acute) exacerbation: Secondary | ICD-10-CM | POA: Diagnosis not present

## 2024-11-26 LAB — COMPREHENSIVE METABOLIC PANEL WITH GFR
ALT: 78 U/L — ABNORMAL HIGH (ref 0–44)
AST: 84 U/L — ABNORMAL HIGH (ref 15–41)
Albumin: 3.4 g/dL — ABNORMAL LOW (ref 3.5–5.0)
Alkaline Phosphatase: 104 U/L (ref 38–126)
Anion gap: 9 (ref 5–15)
BUN: 14 mg/dL (ref 8–23)
CO2: 28 mmol/L (ref 22–32)
Calcium: 9 mg/dL (ref 8.9–10.3)
Chloride: 106 mmol/L (ref 98–111)
Creatinine, Ser: 0.74 mg/dL (ref 0.44–1.00)
GFR, Estimated: >60 mL/min
Glucose, Bld: 145 mg/dL — ABNORMAL HIGH (ref 70–99)
Potassium: 4.1 mmol/L (ref 3.5–5.1)
Sodium: 143 mmol/L (ref 135–145)
Total Bilirubin: 0.3 mg/dL (ref 0.0–1.2)
Total Protein: 6.3 g/dL — ABNORMAL LOW (ref 6.5–8.1)

## 2024-11-26 LAB — RESPIRATORY PANEL BY PCR

## 2024-11-26 LAB — CBC WITH DIFFERENTIAL/PLATELET
Abs Immature Granulocytes: 0.18 10*3/uL — ABNORMAL HIGH (ref 0.00–0.07)
Basophils Absolute: 0.1 10*3/uL (ref 0.0–0.1)
Basophils Relative: 0 %
Eosinophils Absolute: 0 10*3/uL (ref 0.0–0.5)
Eosinophils Relative: 0 %
HCT: 34.5 % — ABNORMAL LOW (ref 36.0–46.0)
Hemoglobin: 11.6 g/dL — ABNORMAL LOW (ref 12.0–15.0)
Immature Granulocytes: 1 %
Lymphocytes Relative: 7 %
Lymphs Abs: 1 10*3/uL (ref 0.7–4.0)
MCH: 31.3 pg (ref 26.0–34.0)
MCHC: 33.6 g/dL (ref 30.0–36.0)
MCV: 93 fL (ref 80.0–100.0)
Monocytes Absolute: 1.2 10*3/uL — ABNORMAL HIGH (ref 0.1–1.0)
Monocytes Relative: 8 %
Neutro Abs: 12.8 10*3/uL — ABNORMAL HIGH (ref 1.7–7.7)
Neutrophils Relative %: 84 %
Platelets: 382 10*3/uL (ref 150–400)
RBC: 3.71 MIL/uL — ABNORMAL LOW (ref 3.87–5.11)
RDW: 12.6 % (ref 11.5–15.5)
WBC: 15.2 10*3/uL — ABNORMAL HIGH (ref 4.0–10.5)
nRBC: 0 % (ref 0.0–0.2)

## 2024-11-26 LAB — GLUCOSE, CAPILLARY
Glucose-Capillary: 130 mg/dL — ABNORMAL HIGH (ref 70–99)
Glucose-Capillary: 139 mg/dL — ABNORMAL HIGH (ref 70–99)
Glucose-Capillary: 147 mg/dL — ABNORMAL HIGH (ref 70–99)
Glucose-Capillary: 210 mg/dL — ABNORMAL HIGH (ref 70–99)
Glucose-Capillary: 220 mg/dL — ABNORMAL HIGH (ref 70–99)
Glucose-Capillary: 228 mg/dL — ABNORMAL HIGH (ref 70–99)
Glucose-Capillary: 313 mg/dL — ABNORMAL HIGH (ref 70–99)

## 2024-11-26 LAB — POCT I-STAT 7, (LYTES, BLD GAS, ICA,H+H)
Acid-base deficit: 3 mmol/L — ABNORMAL HIGH (ref 0.0–2.0)
Acid-base deficit: 3 mmol/L — ABNORMAL HIGH (ref 0.0–2.0)
Acid-base deficit: 4 mmol/L — ABNORMAL HIGH (ref 0.0–2.0)
Bicarbonate: 26.2 mmol/L (ref 20.0–28.0)
Bicarbonate: 22.7 mmol/L (ref 20.0–28.0)
Bicarbonate: 25.2 mmol/L (ref 20.0–28.0)
Calcium, Ion: 1.25 mmol/L (ref 1.15–1.40)
Calcium, Ion: 1.2 mmol/L (ref 1.15–1.40)
Calcium, Ion: 1.24 mmol/L (ref 1.15–1.40)
HCT: 41 % (ref 36.0–46.0)
HCT: 35 % — ABNORMAL LOW (ref 36.0–46.0)
HCT: 41 % (ref 36.0–46.0)
Hemoglobin: 13.9 g/dL (ref 12.0–15.0)
Hemoglobin: 11.9 g/dL — ABNORMAL LOW (ref 12.0–15.0)
Hemoglobin: 13.9 g/dL (ref 12.0–15.0)
O2 Saturation: 91 %
O2 Saturation: 94 %
O2 Saturation: 86 %
Patient temperature: 96.5
Patient temperature: 97.6
Patient temperature: 97.6
Potassium: 3.9 mmol/L (ref 3.5–5.1)
Potassium: 4 mmol/L (ref 3.5–5.1)
Potassium: 4.2 mmol/L (ref 3.5–5.1)
Sodium: 140 mmol/L (ref 135–145)
Sodium: 141 mmol/L (ref 135–145)
Sodium: 141 mmol/L (ref 135–145)
TCO2: 28 mmol/L (ref 22–32)
TCO2: 24 mmol/L (ref 22–32)
TCO2: 27 mmol/L (ref 22–32)
pCO2 arterial: 59.3 mmHg — ABNORMAL HIGH (ref 32–48)
pCO2 arterial: 43.3 mmHg (ref 32–48)
pCO2 arterial: 63.7 mmHg — ABNORMAL HIGH (ref 32–48)
pH, Arterial: 7.247 — ABNORMAL LOW (ref 7.35–7.45)
pH, Arterial: 7.324 — ABNORMAL LOW (ref 7.35–7.45)
pH, Arterial: 7.201 — ABNORMAL LOW (ref 7.35–7.45)
pO2, Arterial: 67 mmHg — ABNORMAL LOW (ref 83–108)
pO2, Arterial: 77 mmHg — ABNORMAL LOW (ref 83–108)
pO2, Arterial: 62 mmHg — ABNORMAL LOW (ref 83–108)

## 2024-11-26 LAB — BLOOD GAS, ARTERIAL
Acid-base deficit: 7.6 mmol/L — ABNORMAL HIGH (ref 0.0–2.0)
Bicarbonate: 27.1 mmol/L (ref 20.0–28.0)
O2 Saturation: 92.7 %
Patient temperature: 37
pCO2 arterial: 115 mmHg (ref 32–48)
pH, Arterial: 6.98 — CL (ref 7.35–7.45)
pO2, Arterial: 83 mmHg (ref 83–108)

## 2024-11-26 LAB — HEMOGLOBIN A1C
Hgb A1c MFr Bld: 5.9 % — ABNORMAL HIGH (ref 4.8–5.6)
Mean Plasma Glucose: 122.63 mg/dL

## 2024-11-26 LAB — MRSA NEXT GEN BY PCR, NASAL: MRSA by PCR Next Gen: NOT DETECTED

## 2024-11-26 LAB — TSH: TSH: 0.428 u[IU]/mL (ref 0.350–4.500)

## 2024-11-26 LAB — MAGNESIUM
Magnesium: 1.8 mg/dL (ref 1.7–2.4)
Magnesium: 2.6 mg/dL — ABNORMAL HIGH (ref 1.7–2.4)

## 2024-11-26 LAB — PHOSPHORUS: Phosphorus: 5.6 mg/dL — ABNORMAL HIGH (ref 2.5–4.6)

## 2024-11-26 LAB — LACTIC ACID, PLASMA
Lactic Acid, Venous: 1.8 mmol/L (ref 0.5–1.9)
Lactic Acid, Venous: 2.3 mmol/L (ref 0.5–1.9)

## 2024-11-26 LAB — T4, FREE: Free T4: 1.35 ng/dL (ref 0.80–2.00)

## 2024-11-26 LAB — PROCALCITONIN: Procalcitonin: <0.1 ng/mL

## 2024-11-26 MED ORDER — METHYLPREDNISOLONE SODIUM SUCC 125 MG IJ SOLR
80.0000 mg | Freq: Two times a day (BID) | INTRAMUSCULAR | Status: DC
  Administered 2024-11-26 – 2024-11-27 (×2): 80 mg via INTRAVENOUS
  Filled 2024-11-26 (×2): qty 2

## 2024-11-26 MED ORDER — POLYETHYLENE GLYCOL 3350 17 G PO PACK
17.0000 g | PACK | Freq: Every day | ORAL | Status: DC
  Administered 2024-11-26: 17 g
  Filled 2024-11-26: qty 1

## 2024-11-26 MED ORDER — ETOMIDATE 2 MG/ML IV SOLN
INTRAVENOUS | Status: AC
  Administered 2024-11-26: 20 mg via INTRAVENOUS
  Filled 2024-11-26: qty 10

## 2024-11-26 MED ORDER — APIXABAN 5 MG PO TABS
5.0000 mg | ORAL_TABLET | Freq: Two times a day (BID) | ORAL | Status: DC
  Filled 2024-11-26: qty 1

## 2024-11-26 MED ORDER — AMIODARONE HCL IN DEXTROSE 360-4.14 MG/200ML-% IV SOLN
60.0000 mg/h | INTRAVENOUS | Status: AC
  Administered 2024-11-26: 60 mg/h via INTRAVENOUS
  Filled 2024-11-26: qty 200

## 2024-11-26 MED ORDER — DEXMEDETOMIDINE HCL IN NACL 400 MCG/100ML IV SOLN
0.0000 ug/kg/h | INTRAVENOUS | Status: DC
  Administered 2024-11-27 – 2024-11-28 (×2): 1.8 ug/kg/h via INTRAVENOUS
  Administered 2024-11-28: 1.3 ug/kg/h via INTRAVENOUS
  Filled 2024-11-26: qty 200
  Filled 2024-11-26 (×4): qty 100

## 2024-11-26 MED ORDER — SODIUM CHLORIDE 0.9 % IV SOLN
2.0000 g | Freq: Two times a day (BID) | INTRAVENOUS | Status: DC
  Administered 2024-11-26 – 2024-11-28 (×4): 2 g via INTRAVENOUS
  Filled 2024-11-26 (×4): qty 12.5

## 2024-11-26 MED ORDER — ETOMIDATE 2 MG/ML IV SOLN
INTRAVENOUS | Status: AC
  Filled 2024-11-26: qty 20

## 2024-11-26 MED ORDER — FENTANYL CITRATE (PF) 50 MCG/ML IJ SOSY
PREFILLED_SYRINGE | INTRAMUSCULAR | Status: AC
  Administered 2024-11-26: 50 ug via INTRAVENOUS
  Filled 2024-11-26: qty 2

## 2024-11-26 MED ORDER — REVEFENACIN 175 MCG/3ML IN SOLN
175.0000 ug | Freq: Every day | RESPIRATORY_TRACT | Status: DC
  Administered 2024-11-26 – 2024-11-28 (×3): 175 ug via RESPIRATORY_TRACT
  Filled 2024-11-26 (×3): qty 3

## 2024-11-26 MED ORDER — MAGNESIUM SULFATE 2 GM/50ML IV SOLN
2.0000 g | Freq: Once | INTRAVENOUS | Status: AC
  Administered 2024-11-26: 2 g via INTRAVENOUS
  Filled 2024-11-26: qty 50

## 2024-11-26 MED ORDER — NOREPINEPHRINE 4 MG/250ML-% IV SOLN
0.0000 ug/min | INTRAVENOUS | Status: DC
  Administered 2024-11-26: 3 ug/min via INTRAVENOUS
  Administered 2024-11-27: 6 ug/min via INTRAVENOUS
  Administered 2024-11-27: 2 ug/min via INTRAVENOUS
  Filled 2024-11-26 (×2): qty 250

## 2024-11-26 MED ORDER — ZIPRASIDONE MESYLATE 20 MG IM SOLR
20.0000 mg | Freq: Once | INTRAMUSCULAR | Status: AC
  Administered 2024-11-27: 20 mg via INTRAMUSCULAR
  Filled 2024-11-26: qty 20

## 2024-11-26 MED ORDER — FENTANYL 2500MCG IN NS 250ML (10MCG/ML) PREMIX INFUSION
0.0000 ug/h | INTRAVENOUS | Status: DC
  Administered 2024-11-26: 25 ug/h via INTRAVENOUS

## 2024-11-26 MED ORDER — IOHEXOL 350 MG/ML SOLN
100.0000 mL | Freq: Once | INTRAVENOUS | Status: AC | PRN
  Administered 2024-11-26: 100 mL via INTRAVENOUS

## 2024-11-26 MED ORDER — VITAL HP 1.0 CAL PO LIQD
1000.0000 mL | ORAL | Status: DC
  Administered 2024-11-26: 1000 mL

## 2024-11-26 MED ORDER — ETOMIDATE 2 MG/ML IV SOLN: 20.0000 mg | Freq: Once | INTRAVENOUS | Status: AC

## 2024-11-26 MED ORDER — ROCURONIUM BROMIDE 10 MG/ML (PF) SYRINGE
PREFILLED_SYRINGE | INTRAVENOUS | Status: AC
  Filled 2024-11-26: qty 10

## 2024-11-26 MED ORDER — FENTANYL 2500MCG IN NS 250ML (10MCG/ML) PREMIX INFUSION
INTRAVENOUS | Status: AC
  Filled 2024-11-26: qty 250

## 2024-11-26 MED ORDER — LACTATED RINGERS IV SOLN: INTRAVENOUS | Status: AC

## 2024-11-26 MED ORDER — LINEZOLID 600 MG/300ML IV SOLN
600.0000 mg | Freq: Two times a day (BID) | INTRAVENOUS | Status: DC
  Filled 2024-11-26: qty 300

## 2024-11-26 MED ORDER — ROCURONIUM BROMIDE 10 MG/ML (PF) SYRINGE
PREFILLED_SYRINGE | INTRAVENOUS | Status: AC
  Administered 2024-11-26: 100 mg via INTRAVENOUS
  Filled 2024-11-26: qty 10

## 2024-11-26 MED ORDER — PROPOFOL 1000 MG/100ML IV EMUL: 0.0000 ug/kg/min | INTRAVENOUS | Status: DC

## 2024-11-26 MED ORDER — FENTANYL CITRATE (PF) 50 MCG/ML IJ SOSY
PREFILLED_SYRINGE | INTRAMUSCULAR | Status: AC
  Filled 2024-11-26: qty 2

## 2024-11-26 MED ORDER — SODIUM CHLORIDE 0.9 % IV SOLN
250.0000 mL | INTRAVENOUS | Status: AC
  Administered 2024-11-26: 250 mL via INTRAVENOUS

## 2024-11-26 MED ORDER — BUDESONIDE 0.5 MG/2ML IN SUSP
0.5000 mg | Freq: Two times a day (BID) | RESPIRATORY_TRACT | Status: DC
  Administered 2024-11-26 – 2024-11-28 (×4): 0.5 mg via RESPIRATORY_TRACT
  Filled 2024-11-26 (×3): qty 2

## 2024-11-26 MED ORDER — DOCUSATE SODIUM 50 MG/5ML PO LIQD: 100.0000 mg | Freq: Two times a day (BID) | ORAL | Status: DC

## 2024-11-26 MED ORDER — INSULIN GLARGINE-YFGN 100 UNIT/ML ~~LOC~~ SOLN
10.0000 [IU] | Freq: Two times a day (BID) | SUBCUTANEOUS | Status: DC
  Administered 2024-11-26 – 2024-11-27 (×3): 10 [IU] via SUBCUTANEOUS
  Filled 2024-11-26 (×7): qty 0.1

## 2024-11-26 MED ORDER — ALBUTEROL SULFATE (2.5 MG/3ML) 0.083% IN NEBU
2.5000 mg | INHALATION_SOLUTION | RESPIRATORY_TRACT | Status: AC
  Administered 2024-11-26 (×2): 2.5 mg via RESPIRATORY_TRACT
  Filled 2024-11-26 (×2): qty 3

## 2024-11-26 MED ORDER — FAMOTIDINE 20 MG PO TABS: 40.0000 mg | ORAL_TABLET | Freq: Every day | ORAL | Status: DC

## 2024-11-26 MED ORDER — FENTANYL CITRATE (PF) 50 MCG/ML IJ SOSY: 25.0000 ug | PREFILLED_SYRINGE | Freq: Once | INTRAMUSCULAR | Status: DC

## 2024-11-26 MED ORDER — FAMOTIDINE 20 MG PO TABS: 20.0000 mg | ORAL_TABLET | Freq: Every day | ORAL | Status: DC

## 2024-11-26 MED ORDER — FENTANYL CITRATE (PF) 50 MCG/ML IJ SOSY: 50.0000 ug | PREFILLED_SYRINGE | Freq: Once | INTRAMUSCULAR | Status: AC

## 2024-11-26 MED ORDER — AMIODARONE HCL IN DEXTROSE 360-4.14 MG/200ML-% IV SOLN
30.0000 mg/h | INTRAVENOUS | Status: DC
  Administered 2024-11-26 – 2024-11-28 (×3): 30 mg/h via INTRAVENOUS
  Filled 2024-11-26 (×3): qty 200

## 2024-11-26 MED ORDER — PROPOFOL 1000 MG/100ML IV EMUL
INTRAVENOUS | Status: AC
  Filled 2024-11-26: qty 100

## 2024-11-26 MED ORDER — DILTIAZEM HCL-DEXTROSE 125-5 MG/125ML-% IV SOLN (PREMIX)
5.0000 mg/h | INTRAVENOUS | Status: DC
  Filled 2024-11-26: qty 125

## 2024-11-26 MED ORDER — SUCCINYLCHOLINE CHLORIDE 200 MG/10ML IV SOSY
PREFILLED_SYRINGE | INTRAVENOUS | Status: AC
  Filled 2024-11-26: qty 10

## 2024-11-26 MED ORDER — STERILE WATER FOR INJECTION IJ SOLN
INTRAMUSCULAR | Status: AC
  Administered 2024-11-26: 10 mL
  Filled 2024-11-26: qty 10

## 2024-11-26 MED ORDER — DILTIAZEM LOAD VIA INFUSION
10.0000 mg | Freq: Once | INTRAVENOUS | Status: DC
  Filled 2024-11-26 (×3): qty 10

## 2024-11-26 MED ORDER — ARFORMOTEROL TARTRATE 15 MCG/2ML IN NEBU
15.0000 ug | INHALATION_SOLUTION | Freq: Two times a day (BID) | RESPIRATORY_TRACT | Status: DC
  Administered 2024-11-26 – 2024-11-28 (×4): 15 ug via RESPIRATORY_TRACT
  Filled 2024-11-26 (×4): qty 2

## 2024-11-26 MED ORDER — INSULIN ASPART 100 UNIT/ML IJ SOLN
0.0000 [IU] | INTRAMUSCULAR | Status: DC
  Administered 2024-11-26: 5 [IU] via SUBCUTANEOUS
  Administered 2024-11-26 – 2024-11-27 (×3): 2 [IU] via SUBCUTANEOUS
  Administered 2024-11-27: 3 [IU] via SUBCUTANEOUS
  Administered 2024-11-28: 2 [IU] via SUBCUTANEOUS
  Filled 2024-11-26: qty 5
  Filled 2024-11-26 (×2): qty 2
  Filled 2024-11-26: qty 3
  Filled 2024-11-26 (×2): qty 2

## 2024-11-26 MED ORDER — FENTANYL BOLUS VIA INFUSION
25.0000 ug | INTRAVENOUS | Status: DC | PRN
  Administered 2024-11-26: 50 ug via INTRAVENOUS
  Administered 2024-11-26 (×2): 25 ug via INTRAVENOUS
  Administered 2024-11-26: 50 ug via INTRAVENOUS
  Administered 2024-11-26: 25 ug via INTRAVENOUS

## 2024-11-26 MED ORDER — PROSOURCE TF20 ENFIT COMPATIBL EN LIQD
60.0000 mL | Freq: Every day | ENTERAL | Status: DC
  Administered 2024-11-26: 60 mL
  Filled 2024-11-26: qty 60

## 2024-11-26 MED ORDER — ROCURONIUM BROMIDE 10 MG/ML (PF) SYRINGE: 100.0000 mg | PREFILLED_SYRINGE | Freq: Once | INTRAVENOUS | Status: AC

## 2024-11-26 MED ORDER — PROPOFOL 1000 MG/100ML IV EMUL
0.0000 ug/kg/min | INTRAVENOUS | Status: DC
  Administered 2024-11-26: 10 ug/kg/min via INTRAVENOUS

## 2024-11-26 MED ORDER — LEVOTHYROXINE SODIUM 25 MCG PO TABS: 125.0000 ug | ORAL_TABLET | Freq: Every day | ORAL | Status: DC

## 2024-11-26 MED ORDER — SENNA 8.6 MG PO TABS
1.0000 | ORAL_TABLET | Freq: Two times a day (BID) | ORAL | Status: DC
  Administered 2024-11-26: 8.6 mg
  Filled 2024-11-26: qty 1

## 2024-11-26 MED ORDER — ATORVASTATIN CALCIUM 80 MG PO TABS: 80.0000 mg | ORAL_TABLET | Freq: Every day | ORAL | Status: DC

## 2024-11-26 MED ORDER — LACTATED RINGERS IV BOLUS: 500.0000 mL | Freq: Once | INTRAVENOUS | Status: DC

## 2024-11-26 MED ORDER — DEXMEDETOMIDINE HCL IN NACL 400 MCG/100ML IV SOLN
INTRAVENOUS | Status: AC
  Administered 2024-11-26: 0.4 ug/kg/h via INTRAVENOUS
  Filled 2024-11-26: qty 100

## 2024-11-26 MED ORDER — MIDAZOLAM HCL 2 MG/2ML IJ SOLN
INTRAMUSCULAR | Status: AC
  Filled 2024-11-26: qty 2

## 2024-11-26 MED ORDER — CHLORHEXIDINE GLUCONATE CLOTH 2 % EX PADS
6.0000 | MEDICATED_PAD | Freq: Every day | CUTANEOUS | Status: DC
  Administered 2024-11-26 – 2024-11-28 (×3): 6 via TOPICAL

## 2024-11-26 NOTE — Significant Event (Signed)
 Rapid Response Event Note   Reason for Call :  Elevated HR  Initial Focused Assessment:  Patient in respiratory distress upon entering room; per report CCMD called for increased HR, RN entered to find patient brushing hair. O2 was increased and neb was given. Rapid RN and RT to bedside, pt states she is beginning to feel better with the oxygen. Denies CP, flutters, palpitations. Lungs tight bilaterally with wheezes and some crackles at base. Skin warm and dry. HR remains elevated. Patient's only concern at this time is draining bladder (she self caths) and brushing her hair.   165/89 (112) HR 140-160 RR 28 O2 95-97% NRB 15L   Interventions/Plan of Care:  EKG  CXR Wean O2 as able. Attempted [place] briefly  Cardiazem gtt ordered PCU orders: tx 6E20  Event Summary:  MD Notified: Laurence MD Call Time: 1145 Arrival Time: 1147 End Time: 1230  Addendum: 1330 Called back 1257, stating pt unresponsive since arrival to unit. VSS. LKW 1230, pt talking en route to 6E. Patient unresponsive, pupils 7, right sluggish. Lungs remain with wheezes/tight. Abdomen still distended. Skin is now diaphoretic and pallor.  CCM immediately called to bedside, decision to intubate--transferred to 3M11 emergently for intubation. Patient Code stroke also activated.   Tonna Chiquita POUR, RN

## 2024-11-26 NOTE — Progress Notes (Addendum)
 Plan of Care Note for accepted transfer   Patient: Stefanie Carter MRN: [id number]   DOA: 11/25/2024  BP 115/61 (BP Location: Left Arm)   Pulse 93   Temp 98.4 F (36.9 C) (Oral)   Resp 19   SpO2 95% on 10L [place]   79 year old status post fall found to have new onset A-fib and hypoxic respiratory failure due to COPD exacerbation.  She was seen upon arrival to [facility]. The patient denies any pain. She is not feeling short of breath but does continue to have a severe cough. She is talkative and seems to be at baseline.  She reports that she is surprised that she was found to have low O2 sat that she generally does not have that problem. She is worried about going home and the bad weather but otherwise has no complaints.  No change to current plan recommended.  Author: ARTHEA CHILD, MD 11/26/2024  Check [email or web address] for on-call coverage.  Nursing staff, Please call [facility] Admits & Consults System-Wide number on Amion as soon as patient's arrival, so appropriate admitting provider can evaluate the pt.

## 2024-11-26 NOTE — Plan of Care (Signed)
" °  Problem: Education: Goal: Knowledge of General Education information will improve Description: Including pain rating scale, medication(s)/side effects and non-pharmacologic comfort measures Outcome: Progressing   Problem: Nutrition: Goal: Adequate nutrition will be maintained Outcome: Progressing   Problem: Activity: Goal: Ability to tolerate increased activity will improve Outcome: Progressing   "

## 2024-11-26 NOTE — Discharge Instructions (Signed)

## 2024-11-26 NOTE — Consult Note (Signed)
 NEUROLOGY CONSULT NOTE   Date of service: November 26, 2024 Patient Name: Stefanie Carter MRN:  [id number] DOB:  1946-02-17 Chief Complaint: Code stroke-unresponsiveness Requesting Provider: Laurence Locus, DO  History of Present Illness  Stefanie Carter is a 79 y.o. female with hx of COPD, presented to the ER after she fell on ice yesterday.  She hit her head.  Her neighbor checked on her and recommended she come to the ER but she refused initially and then later on did come in to be evaluated in the ER.  She was noted to be hypoxic with room air saturation of 87% in ER triage.  She was also noted to be wheezing.  She has a history of prior stroke and is on Plavix .  No history of chest pain but noted to have new onset atrial fibrillation. She was admitted for management of acute respiratory failure after her fall, maintained on supplemental oxygen, started on Eliquis  5 mg twice daily for new onset atrial fibrillation with RVR and started on low-dose Cardizem .  She also had a COPD exacerbation for which she was started on steroids, DuoNebs. She was doing fine until this afternoon when right around 12:30 PM, she became unresponsive.  Her pupils were both enlarged according to the nurse but reactive.  She did not respond to voice or noxious simulation.  A code stroke was activated.  She was not able to provide any history.  LKW: 12:30 PM Modified rankin score: 0-Completely asymptomatic and back to baseline post- stroke IV Thrombolysis: Eliquis @10 :40 AM EVT: No ELVO  NIHSS components Score: Comment  1a Level of Conscious 0[]  1[]  2[]  3[x]      1b LOC Questions 0[]  1[]  2[x]       1c LOC Commands 0[]  1[]  2[x]       2 Best Gaze 0[x]  1[]  2[]       3 Visual 0[x]  1[]  2[]  3[]      4 Facial Palsy 0[]  1[]  2[]  3[x]      5a Motor Arm - left 0[]  1[]  2[]  3[]  4[x]  UN[]    5b Motor Arm - Right 0[]  1[]  2[]  3[]  4[x]  UN[]    6a Motor Leg - Left 0[]  1[]  2[]  3[]  4[x]  UN[]    6b Motor Leg - Right 0[]  1[]  2[]   3[]  4[x]  UN[]    7 Limb Ataxia 0[x]  1[]  2[]  UN[]      8 Sensory 0[x]  1[]  2[]  UN[]      9 Best Language 0[]  1[]  2[]  3[x]      10 Dysarthria 0[]  1[]  2[x]  UN[]      11 Extinct. and Inattention 0[x]  1[]  2[]       TOTAL: 31      ROS   Past History   Past Medical History:  Diagnosis Date   Acute ischemic stroke (HCC) 11/07/2023   Anxiety    Cerebellar stroke (HCC) 12/01/2023   Diabetes (HCC)    Heart failure (HCC) 10/18/2024   HLD (hyperlipidemia)    HTN (hypertension)    Hypothyroid    Spinal stenosis     Past Surgical History:  Procedure Laterality Date   APPENDECTOMY     SALIVARY GLAND SURGERY     TONSILLECTOMY      Family History: Family History  Problem Relation Age of Onset   Heart disease Mother    COPD Mother    Kidney disease Father    Depression Father    Glaucoma Father    Bladder Cancer Neg Hx    Renal cancer Neg Hx    Uterine cancer Neg  Hx     Social History  reports that she quit smoking about 13 years ago. Her smoking use included cigarettes. She started smoking about 43 years ago. She has a 30 pack-year smoking history. She has never used smokeless tobacco. She reports current alcohol  use. She reports that she does not use drugs.  Allergies[1]  Medications  Current Medications[2]  Vitals   Vitals:   11/26/24 1155 11/26/24 1205 11/26/24 1220 11/26/24 1259  BP: (!) 165/89  (!) 186/99 (!) 161/106  Pulse: (!) 105   (!) 131  Resp: (!) 24  (!) 28 (!) 25  Temp:    (!) 96.5 F (35.8 C)  TempSrc:    Axillary  SpO2: 100% 100% 100% 91%    There is no height or weight on file to calculate BMI.   Physical Exam   General: Completely obtunded with eyes open, unresponsive HEENT: Normocephalic atraumatic Lungs: Wheezing CVS: Irregularly irregular heart rate Neurological exam she is completely unresponsive with eyes open No response to voice or noxious stimulation Cranial nerves: Pupils are 4 mm reactive to 3 sluggishly bilaterally, does not blink to  threat from either side, gaze is midline, face appears symmetric Motor examination: No spontaneous movement.  No movement to noxious simulation Sensory exam: As above Coordination cannot be assessed given her mentation  Labs/Imaging/Neurodiagnostic studies   CBC:  Recent Labs  Lab 12-02-2024 1344 11/26/24 0501  WBC 14.0* 15.2*  NEUTROABS 10.7* 12.8*  HGB 13.2 11.6*  HCT 39.3 34.5*  MCV 92.5 93.0  PLT 431* 382   Basic Metabolic Panel:  Lab Results  Component Value Date   NA 143 11/26/2024   K 4.1 11/26/2024   CO2 28 11/26/2024   GLUCOSE 145 (H) 11/26/2024   BUN 14 11/26/2024   CREATININE 0.74 11/26/2024   CALCIUM  9.0 11/26/2024   GFRNONAA >60 11/26/2024   Lipid Panel:  Lab Results  Component Value Date   LDLCALC 85 02/09/2024   HgbA1c:  Lab Results  Component Value Date   HGBA1C 5.9 (H) 11/26/2024   Urine Drug Screen:     Component Value Date/Time   LABOPIA NONE DETECTED 12/01/2023 0253   COCAINSCRNUR NONE DETECTED 12/01/2023 0253   LABBENZ NONE DETECTED 12/01/2023 0253   AMPHETMU NONE DETECTED 12/01/2023 0253   THCU NONE DETECTED 12/01/2023 0253   LABBARB NONE DETECTED 12/01/2023 0253    Alcohol  Level No results found for: [facility] INR  Lab Results  Component Value Date   INR 0.9 12/01/2023   APTT  Lab Results  Component Value Date   APTT 27 12/01/2023   CT Head without contrast(Personally reviewed): No acute findings  CT angio Head and Neck with contrast(Personally reviewed): No ELVO  CT angio chest PE protocol: No PE.  Diffuse interlobar thickening ground glass attenuation and multifocal patchy areas of consolidation throughout the both lungs most consistent with pulmonary edema/ARDS or multifocal pneumonia.  Small bilateral pleural effusions.   ASSESSMENT   Stefanie Carter is a 79 y.o. female past history of COPD, initially came into the ER after a fall, noted to be hypoxic on room air and wheezing, also noted to be in new onset atrial  fibrillation, was being managed on the floors when she had sudden onset of unresponsiveness at around 12:30 PM. Had a very poor exam with complete unresponsiveness-had high NIH stroke scale with no focality on exam.  She was not protecting her airway First taken to the medical ICU from the floor, intubated and then taken for  imaging. Taken for head CT stat-this was unremarkable CT angio head and neck was done which was also unremarkable.  CT PE study negative for PE but showed pulmonary edema/ARDS or multifocal pneumonia. Her current presentation is likely due to hypercarbia/CO2 narcosis rather than stroke.  Impression: Sudden onset of unresponsiveness-toxic encephalopathy from hypercarbia and CO2 narcosis, low suspicion for stroke   RECOMMENDATIONS  Obtain arterial blood gas Correction of respiratory derangements per primary team If she remains focal on exam in spite of correction of her respiratory derangements, consider MRI   Addendum Arterial blood gas obtained as we were going to the CT scan pH 6.98, pCO2 115, pO2 83, HCO3 27.1 Clinical presentation likely related to hypercarbia I do not see a strong need for an MRI of the brain to be done Again, once she starts to come around and if she has any focality on exam, consider MRI of the brain otherwise I would forego that. Please call neurology with questions as needed Plan discussed with Dr. Kara ______________________________________________________________________    Signed, Eligio Lav, MD [facility]  CRITICAL CARE ATTESTATION Performed by: Eligio Lav, MD Total critical care time: 55 minutes Critical care time was exclusive of separately billable procedures and treating other patients and/or supervising APPs/Residents/Students Critical care was necessary to treat or prevent imminent or life-threatening deterioration. This patient is critically ill and at significant risk for neurological worsening and/or  death and care requires constant monitoring. Critical care was time spent personally by me on the following activities: development of treatment plan with patient and/or surrogate as well as nursing, discussions with consultants, evaluation of patient's response to treatment, examination of patient, obtaining history from patient or surrogate, ordering and performing treatments and interventions, ordering and review of laboratory studies, ordering and review of radiographic studies, pulse oximetry, re-evaluation of patient's condition, participation in multidisciplinary rounds and medical decision making of high complexity in the care of this patient.      [1]  Allergies Allergen Reactions   Bicillin L-A [Penicillin G Benzathine] Anaphylaxis   Other Other (See Comments)    Substances with sulfonamide structure and antibacterial mechanism of action (substance) - unknown reaction   Penicillins Anaphylaxis   Bactrim [Sulfamethoxazole-Trimethoprim] Other (See Comments)    Unknown reaction   Chantix [Varenicline] Other (See Comments)    Unknown reaction   Glucophage [Metformin] Other (See Comments)    Unknown reaction   Sulfa Antibiotics     Unknown reaction during childhood  [2]  Current Facility-Administered Medications:    acetaminophen  (TYLENOL ) tablet 1,000 mg, 1,000 mg, Oral, Q6H PRN **OR** acetaminophen  (TYLENOL ) suppository 650 mg, 650 mg, Rectal, Q6H PRN, Laurence Locus, DO   apixaban  (ELIQUIS ) tablet 5 mg, 5 mg, Oral, BID, Laurence Locus, DO, 5 mg at 11/26/24 1044   atorvastatin  (LIPITOR ) tablet 80 mg, 80 mg, Oral, Daily, Laurence Locus, DO, 80 mg at 11/26/24 1044   cefTRIAXone  (ROCEPHIN ) 1 g in sodium chloride  0.9 % 100 mL IVPB, 1 g, Intravenous, Q24H, Laurence Locus, DO   diltiazem  (CARDIZEM ) 1 mg/mL load via infusion 10 mg, 10 mg, Intravenous, Once **AND** diltiazem  (CARDIZEM ) 125 mg in dextrose  5% 125 mL (1 mg/mL) infusion, 5-15 mg/hr, Intravenous, Continuous, Laurence Locus, DO   doxycycline   (VIBRA -TABS) tablet 100 mg, 100 mg, Oral, Q12H, Laurence Locus, DO   famotidine  (PEPCID ) tablet 40 mg, 40 mg, Oral, Daily, Laurence Locus, DO, 40 mg at 11/26/24 1046   insulin  aspart (novoLOG ) injection 0-5 Units, 0-5 Units, Subcutaneous, QHS, Laurence Locus, DO  insulin  aspart (novoLOG ) injection 0-9 Units, 0-9 Units, Subcutaneous, TID WC, Laurence Locus, DO   ipratropium-albuterol  (DUONEB) 0.5-2.5 (3) MG/3ML nebulizer solution 3 mL, 3 mL, Nebulization, Q6H, Laurence Locus, DO, 3 mL at 11/26/24 0817   levothyroxine  (SYNTHROID ) tablet 125 mcg, 125 mcg, Oral, QAC breakfast, Laurence Locus, DO, 125 mcg at 11/26/24 0618   LORazepam  (ATIVAN ) tablet 0.5 mg, 0.5 mg, Oral, Q8H PRN, Laurence Locus, DO, 0.5 mg at 11/26/24 1046   magnesium  sulfate IVPB 2 g 50 mL, 2 g, Intravenous, Once, Laurence Locus, DO   methylPREDNISolone  sodium succinate (SOLU-MEDROL ) 125 mg/2 mL injection 80 mg, 80 mg, Intravenous, Q12H, Laurence Locus, DO   ondansetron  (ZOFRAN ) tablet 4 mg, 4 mg, Oral, Q6H PRN **OR** ondansetron  (ZOFRAN ) injection 4 mg, 4 mg, Intravenous, Q6H PRN, Laurence Locus, DO   sertraline  (ZOLOFT ) tablet 100 mg, 100 mg, Oral, Daily, Laurence Locus, DO, 100 mg at 11/26/24 1046

## 2024-11-26 NOTE — Progress Notes (Signed)
"   Latest Reference Range & Units 11/26/24 15:44  Sample type  ARTERIAL  pH, Arterial 7.35 - 7.45  7.247 (L)  pCO2 arterial 32 - 48 mmHg 59.3 (H)  pO2, Arterial 83 - 108 mmHg 67 (L)  TCO2 22 - 32 mmol/L 28  Acid-base deficit 0.0 - 2.0 mmol/L 3.0 (H)  Bicarbonate 20.0 - 28.0 mmol/L 26.2  O2 Saturation % 91  Patient temperature  96.5 F  Collection site  RADIAL, ALLEN'S TEST ACCEPTABLE  Sodium 135 - 145 mmol/L 140  Potassium 3.5 - 5.1 mmol/L 3.9  Calcium  Ionized 1.15 - 1.40 mmol/L 1.25  (L): Data is abnormally low (H): Data is abnormally high  CCM notified of results and RT ordered to continue with current ventilator settings. RT to await further orders.  "

## 2024-11-26 NOTE — Progress Notes (Signed)
 There was a consult for placing PIV access. Patient already had 3 PIV access. Patient's nurse stated that it is okay at this time, no need PIV access. HS [employer]

## 2024-11-26 NOTE — Progress Notes (Signed)
 PT Cancellation Note  Patient Details Name: Stefanie Carter MRN: [id number] DOB: May 23, 1946   Cancelled Treatment:    Reason Eval/Treat Not Completed: Medical issues which prohibited therapy. Pt in Afib with RVR, HR 174 bpm, rapid present. Will check back on pt tomorrow.   Richerd Lipoma, PT  Acute Rehab Services Secure chat preferred Office [phone]    Richerd CROME Kimoni Pickerill 11/26/2024, 1:13 PM

## 2024-11-26 NOTE — Progress Notes (Addendum)
 Patient refused all her night meds ,as per her ,she has taken those medicine in morning and she does not have any medicine to be taken at night NP Lavanda horns informed. She also brought some home medicine but she refused to show us  ,nursing charge was informed and educated her not to take those medicine while she is here in hospital and she agreed to it. Patient was also found to be doing self catheterization on her own .When she was asked ,she said' I don't know ,I have been doing this since 3 years and I am incontinent sometime, charge and provider made aware.

## 2024-11-26 NOTE — Progress Notes (Signed)
 RT NOTE: RT transported PT on ventilator from room 3M11 to CT and back to room 3M11 with no complications.

## 2024-11-26 NOTE — Code Documentation (Signed)
 Stroke Response Nurse Documentation Code Documentation  Stefanie Carter is a 80 y.o. female admitted to [facility]  on 11/25/2024 for a fall with past medical hx of COPD. On Eliquis  (apixaban ) daily. Code stroke was activated by Rapid Response RN .   Patient on unit 3E where she was LKW at 1230 and now complaining of unresponsive. Per RN, patient initally admitted for fall. While on the floor patient was in her normal state of health until 1240 when she became unresponsive with pupils enlarged and the Left pupil nonreactive.  Stroke team at the bedside after patient activation. Patient to CT with team. NIHSS 31, see documentation for details and code stroke times. Patient with decreased LOC, disoriented, not following commands, bilateral arm weakness, bilateral leg weakness, Global aphasia , and dysarthria  on exam. The following imaging was completed:  CT Head and CTA. Patient is not a candidate for IV Thrombolytic due to recently starting on Eliquis . Patient is not a candidate for IR due to no LVO noted on imaging per MD.   Care/Plan: VS/NIHSS q2hr x12hr, then q4hr; BP Goal <220/120, NPO until swallow evaluation passed.   Process Delays Noted: Transferred to ICU and intubated  Bedside handoff with RN Millard.    Annabella DELENA Bame  Stroke Response RN

## 2024-11-26 NOTE — Progress Notes (Signed)
 11/26/2024 Hypotensive with sedation Sedation held now she's mad Ground team eval for extubation otherwise need to increase sedation and place CVL  Rolan Sharps MD PCCM

## 2024-11-26 NOTE — Progress Notes (Signed)
 Call from telemetry tech stating that patient's HR was elevated and maintaining at 130-140's and would increase to the 170's.  Assessed patient and she was complaining of difficulty breathing, her face was flushed and she was diaphoretic  .  Notified MD. Called Charge RN to the room to assist with monitoring patient.  Called RR RN and RT.  Administered duoneb per RT. RR reported to bedside, got EKG.  RR RN called MD and notified him of the results.  Transferred patient to 62E20, gave RN report at the bedside.

## 2024-11-26 NOTE — Consult Note (Signed)
 "  NAME:  Stefanie Carter, MRN:  [id number], DOB:  [birth year], LOS: 0 ADMISSION DATE:  11/25/2024, CONSULTATION DATE:  11/26/2024 REFERRING MD:  TRH, CHIEF COMPLAINT:  Unresponsiveness    History of Present Illness:  Stefanie Carter is a 79 y.o. with a past medical history significant for HTN, HLD, type 2 diabetes, prior cerebellar stroke, and hypothyroidism who presented to the ED 1/30 after suffering mechanical fall slipping on ice and trauma.  Patient initially refused transfer to the ER.  She also reported cough and congestion that began 48 hours prior to admission.  On ED arrival patient was seen hypoxic with oxygen saturation 87% ER attempted admission patient.  Patient was admitted per hospitalist for management of acute hypoxic respiratory failure and A-fib RVR.  Med afternoon 1/31 patient seen with development A-fib RVR prompting triage to progressive care.  Quickly upon transfer patient often developed significant change in mentation include unresponsiveness.  This prompted transfer and intubation for airway protection  Pertinent  Medical History  HTN, HLD, type 2 diabetes, prior cerebellar stroke, and hypothyroidism   Significant Hospital Events: Including procedures, antibiotic start and stop dates in addition to other pertinent events   1/30 presented initially for complaints of fall on ED arrival was seen hypoxic and in A-fib RVR 1/31 worsening hemodynamics with A-fib RVR and eventual development of unresponsiveness transferred to ICU and intubated  Interim History / Subjective:  As above  Objective    Blood pressure (!) 161/106, pulse (!) 131, temperature (!) 96.5 F (35.8 C), temperature source Axillary, resp. rate (!) 25, height 5' (1.524 m), SpO2 91%.    FiO2 (%):  [28 %-36 %] 36 %   Intake/Output Summary (Last 24 hours) at 11/26/2024 1331 Last data filed at 11/25/2024 2330 Gross per 24 hour  Intake 503.77 ml  Output --  Net 503.77 ml   There were no  vitals filed for this visit.  Examination: General: Acute on chronic ill-appearing middle-aged female female lying in bed on mechanical ventilation in no acute distress HEENT: ETT, MM pink/moist, PERRL,  Neuro: Unresponsive on vent CV: s1s2 regular rate and rhythm, no murmur, rubs, or gallops,  PULM: Agonal respirations intubation GI: soft, bowel sounds active in all 4 quadrants, non-tender, non-distended Extremities: warm/dry, no edema  Skin: no rashes or lesions  Resolved problem list   Assessment and Plan  Acute on chronic hypoxic and hypercapnic respiratory failure -ABG completed prior to intubation for workup of AMS.  pH 6.97 with pCO2 115 Former smoker COPD with acute exacerbation P: Continue ventilator support with lung protective strategies  Wean PEEP and FiO2 for sats greater than 90%. Head of bed elevated 30 degrees. Plateau pressures less than 30 cm H20.  Follow intermittent chest x-ray and ABG.   SAT/SBT as tolerated, mentation preclude extubation  Ensure adequate pulmonary hygiene  Follow cultures  VAP bundle in place  PAD protocol Continue Pulmicort , Yupelri , and Brovana  Stress dose steroids  New onset A-fib RVR - Since admission, decision made to stop home Plavix  utilized for prior CVA and exchanged for Eliquis . P: Place OG and continue p.o. Eliquis  if unable consider IV heparin CT scan Optimize electrolytes Continuous telemetry Echocardiogram pending  Hypothyroidism P: Continue home Synthroid  per tube   Type 2 diabetes P: SSI CBG goal 140-180 CBG checks every 4  Hypertension P: Continuous telemetry Continue IV Cardizem  As needed IV antihypertensives  Depression P: Hold home Zoloft   Labs   CBC: Recent Labs  Lab 11/25/24 1344 11/26/24  0501  WBC 14.0* 15.2*  NEUTROABS 10.7* 12.8*  HGB 13.2 11.6*  HCT 39.3 34.5*  MCV 92.5 93.0  PLT 431* 382    Basic Metabolic Panel: Recent Labs  Lab 11/25/24 1344 11/26/24 0501  NA 146* 143  K  3.3* 4.1  CL 101 106  CO2 31 28  GLUCOSE 82 145*  BUN 12 14  CREATININE 0.71 0.74  CALCIUM  10.3 9.0  MG  --  1.8   GFR: CrCl cannot be calculated (Unknown ideal weight.). Recent Labs  Lab 11/25/24 1344 11/26/24 0501  WBC 14.0* 15.2*    Liver Function Tests: Recent Labs  Lab 11/25/24 1344 11/26/24 0501  AST 51* 84*  ALT 50* 78*  ALKPHOS 116 104  BILITOT 0.5 0.3  PROT 7.5 6.3*  ALBUMIN 4.0 3.4*   No results for input(s): LIPASE, AMYLASE in the last 168 hours. No results for input(s): AMMONIA in the last 168 hours.  ABG No results found for: PHART, PCO2ART, PO2ART, HCO3, TCO2, ACIDBASEDEF, O2SAT   Coagulation Profile: No results for input(s): INR, PROTIME in the last 168 hours.  Cardiac Enzymes: No results for input(s): CKTOTAL, CKMB, CKMBINDEX, TROPONINI in the last 168 hours.  HbA1C: Hgb A1c MFr Bld  Date/Time Value Ref Range Status  11/26/2024 05:01 AM 5.9 (H) 4.8 - 5.6 % Final    Comment:    (NOTE) Diagnosis of Diabetes The following HbA1c ranges recommended by the American Diabetes Association (ADA) may be used as an aid in the diagnosis of diabetes mellitus.  Hemoglobin             Suggested A1C NGSP%              Diagnosis  <5.7                   Non Diabetic  5.7-6.4                Pre-Diabetic  >6.4                   Diabetic  <7.0                   Glycemic control for                       adults with diabetes.    07/21/2024 03:19 PM 6.7 (H) 4.8 - 5.6 % Final    Comment:             Prediabetes: 5.7 - 6.4          Diabetes: >6.4          Glycemic control for adults with diabetes: <7.0     CBG: Recent Labs  Lab 11/25/24 2111 11/26/24 0815 11/26/24 1151 11/26/24 1300  GLUCAP 175* 130* 210* 313*    Review of Systems:   Unable to assess   Past Medical History:  She,  has a past medical history of Acute ischemic stroke (HCC) (11/07/2023), Anxiety, Cerebellar stroke (HCC) (12/01/2023), Diabetes  (HCC), Heart failure (HCC) (10/18/2024), HLD (hyperlipidemia), HTN (hypertension), Hypothyroid, and Spinal stenosis.   Surgical History:   Past Surgical History:  Procedure Laterality Date   APPENDECTOMY     SALIVARY GLAND SURGERY     TONSILLECTOMY       Social History:   reports that she quit smoking about 13 years ago. Her smoking use included cigarettes. She started smoking about 43 years ago. She has a 30 pack-year smoking  history. She has never used smokeless tobacco. She reports current alcohol  use. She reports that she does not use drugs.   Family History:  Her family history includes COPD in her mother; Depression in her father; Glaucoma in her father; Heart disease in her mother; Kidney disease in her father. There is no history of Bladder Cancer, Renal cancer, or Uterine cancer.   Allergies Allergies[1]   Home Medications  Prior to Admission medications  Medication Sig Start Date End Date Taking? Authorizing Provider  acetaminophen  (TYLENOL ) 325 MG tablet Take 650 mg by mouth 2 (two) times daily as needed for fever or headache (pain).   Yes [provider]  albuterol  (VENTOLIN  HFA) 108 (90 Base) MCG/ACT inhaler Inhale 2 puffs into the lungs every 6 (six) hours as needed for wheezing or shortness of breath.   Yes [provider]  atorvastatin  (LIPITOR ) 80 MG tablet Take 1 tablet (80 mg total) by mouth daily. 11/10/23  Yes Hongalgi, Anand D, MD  caffeine 200 MG TABS tablet Take 200 mg by mouth daily as needed.   Yes [provider]  Cholecalciferol (VITAMIN D-3 PO) Take 1 capsule by mouth daily.   Yes [provider]  clopidogrel  (PLAVIX ) 75 MG tablet Take 75 mg by mouth daily. 12/30/23  Yes [provider]  Coenzyme Q10 (COQ10 PO) Take 1 capsule by mouth daily.   Yes [provider]  docusate sodium  (COLACE) 100 MG capsule Take 100 mg by mouth See admin instructions. Take 1 capsule (100mg ) by mouth daily - take an additional 1  capsule if needed for constipation.   Yes [provider]  famotidine  (PEPCID ) 40 MG tablet Take 1 tablet (40 mg total) by mouth daily. 11/09/23  Yes Hongalgi, Trenda BIRCH, MD  gabapentin  (NEURONTIN ) 100 MG capsule Take 100 mg by mouth at bedtime as needed (restless legs).   Yes [provider]  ipratropium-albuterol  (DUONEB) 0.5-2.5 (3) MG/3ML SOLN Take 3 mLs by nebulization See admin instructions. Inhale 1 vial via nebulizer twice daily when sick, and up to 4 times daily as needed for wheezing or shortness of breath.   Yes [provider]  levothyroxine  (SYNTHROID ) 125 MCG tablet Take 1 tablet (125 mcg total) by mouth daily before breakfast. Patient taking differently: Take 125 mcg by mouth daily. 11/09/23  Yes Hongalgi, Anand D, MD  loratadine (CLARITIN) 10 MG tablet Take 10 mg by mouth daily as needed for allergies.   Yes [provider]  LORazepam  (ATIVAN ) 0.5 MG tablet Take 0.5 mg by mouth See admin instructions. Take 1 tablet (0.5mg ) by mouth daily and up to three times daily as needed for anxiety. 04/09/23  Yes [provider]  MAGNESIUM  PO Take 1 tablet by mouth daily.   Yes [provider]  meclizine  (ANTIVERT ) 25 MG tablet Take 1 tablet (25 mg total) by mouth 3 (three) times daily as needed for dizziness. 11/09/23  Yes Hongalgi, Anand D, MD  methocarbamol  (ROBAXIN ) 500 MG tablet Take 0.5-1 tablets (250-500 mg total) by mouth every 8 (eight) hours as needed for muscle spasms (neck pain and tightness). Patient taking differently: Take 500 mg by mouth See admin instructions. Take 1 tablet (500mg ) by mouth at bedtime, up to three times daily as needed for muscle spasms. 09/07/24  Yes Emeline Search C, DO  Multiple Vitamins-Minerals (MULTIVITAMIN WOMEN 50+) TABS Take 1 tablet by mouth daily.   Yes [provider]  OZEMPIC, 0.25 OR 0.5 MG/DOSE, 2 MG/3ML SOPN Inject 0.5 mg into  the skin every Saturday.   Yes [provider]  Sennosides  25 MG TABS Take 25-100 mg by mouth See admin instructions. Take 1 tablet (25mg ) by mouth daily - up to 4 tablets (100mg ) daily as needed for constipation.   Yes [provider]  sertraline  (ZOLOFT ) 100 MG tablet Take 100 mg by mouth daily. 06/11/21  Yes [provider]  VITAMIN E PO Take 1 capsule by mouth daily.   Yes [provider]  incobotulinumtoxinA  (XEOMIN) 100 units SOLR injection Inject 100 Units into the muscle every 3 (three) months. Patient not taking: Reported on 11/26/2024 11/23/24   Onita Duos, MD     Critical care time:   CRITICAL CARE Performed by: Kannon Granderson D. Harris   Total critical care time: 42 minutes  Critical care time was exclusive of separately billable procedures and treating other patients.  Critical care was necessary to treat or prevent imminent or life-threatening deterioration.  Critical care was time spent personally by me on the following activities: development of treatment plan with patient and/or surrogate as well as nursing, discussions with consultants, evaluation of patient's response to treatment, examination of patient, obtaining history from patient or surrogate, ordering and performing treatments and interventions, ordering and review of laboratory studies, ordering and review of radiographic studies, pulse oximetry and re-evaluation of patient's condition. Wilmot Quevedo D. Harris, NP-C [facility] Personal contact information can be found on Amion  If no contact or response made please call [phone] 11/26/2024, 2:05 PM             [1]  Allergies Allergen Reactions   Bicillin L-A [Penicillin G Benzathine] Anaphylaxis   Other Other (See Comments)    Substances with sulfonamide structure and antibacterial mechanism of action (substance) - unknown reaction   Penicillins Anaphylaxis   Bactrim [Sulfamethoxazole-Trimethoprim] Other (See Comments)    Unknown reaction   Chantix [Varenicline] Other (See  Comments)    Unknown reaction   Glucophage [Metformin] Other (See Comments)    Unknown reaction   Sulfa Antibiotics     Unknown reaction during childhood   "

## 2024-11-27 ENCOUNTER — Inpatient Hospital Stay (HOSPITAL_COMMUNITY)

## 2024-11-27 DIAGNOSIS — Z87891 Personal history of nicotine dependence: Secondary | ICD-10-CM | POA: Diagnosis not present

## 2024-11-27 DIAGNOSIS — J189 Pneumonia, unspecified organism: Secondary | ICD-10-CM | POA: Diagnosis not present

## 2024-11-27 DIAGNOSIS — J9622 Acute and chronic respiratory failure with hypercapnia: Secondary | ICD-10-CM | POA: Diagnosis not present

## 2024-11-27 DIAGNOSIS — I4891 Unspecified atrial fibrillation: Secondary | ICD-10-CM | POA: Diagnosis not present

## 2024-11-27 DIAGNOSIS — J9621 Acute and chronic respiratory failure with hypoxia: Secondary | ICD-10-CM | POA: Diagnosis not present

## 2024-11-27 LAB — COMPREHENSIVE METABOLIC PANEL WITH GFR
ALT: 114 U/L — ABNORMAL HIGH (ref 0–44)
AST: 100 U/L — ABNORMAL HIGH (ref 15–41)
Albumin: 3.1 g/dL — ABNORMAL LOW (ref 3.5–5.0)
Alkaline Phosphatase: 108 U/L (ref 38–126)
Anion gap: 14 (ref 5–15)
BUN: 24 mg/dL — ABNORMAL HIGH (ref 8–23)
CO2: 23 mmol/L (ref 22–32)
Calcium: 8.8 mg/dL — ABNORMAL LOW (ref 8.9–10.3)
Chloride: 106 mmol/L (ref 98–111)
Creatinine, Ser: 1.07 mg/dL — ABNORMAL HIGH (ref 0.44–1.00)
GFR, Estimated: 53 mL/min — ABNORMAL LOW
Glucose, Bld: 170 mg/dL — ABNORMAL HIGH (ref 70–99)
Potassium: 3.8 mmol/L (ref 3.5–5.1)
Sodium: 142 mmol/L (ref 135–145)
Total Bilirubin: 0.2 mg/dL (ref 0.0–1.2)
Total Protein: 5.8 g/dL — ABNORMAL LOW (ref 6.5–8.1)

## 2024-11-27 LAB — ECHOCARDIOGRAM COMPLETE
Area-P 1/2: 4.49 cm2
Calc EF: 38 %
Height: 60 in
MV M vel: 2.64 m/s
MV Peak grad: 27.9 mmHg
S' Lateral: 3.5 cm
Single Plane A2C EF: 34.7 %
Single Plane A4C EF: 37.7 %
Weight: 2116.42 [oz_av]

## 2024-11-27 LAB — POCT I-STAT 7, (LYTES, BLD GAS, ICA,H+H)
Acid-base deficit: 4 mmol/L — ABNORMAL HIGH (ref 0.0–2.0)
Bicarbonate: 24.7 mmol/L (ref 20.0–28.0)
Calcium, Ion: 1.23 mmol/L (ref 1.15–1.40)
HCT: 39 % (ref 36.0–46.0)
Hemoglobin: 13.3 g/dL (ref 12.0–15.0)
O2 Saturation: 93 %
Patient temperature: 96.2
Potassium: 4 mmol/L (ref 3.5–5.1)
Sodium: 139 mmol/L (ref 135–145)
TCO2: 26 mmol/L (ref 22–32)
pCO2 arterial: 54.6 mmHg — ABNORMAL HIGH (ref 32–48)
pH, Arterial: 7.256 — ABNORMAL LOW (ref 7.35–7.45)
pO2, Arterial: 73 mmHg — ABNORMAL LOW (ref 83–108)

## 2024-11-27 LAB — TRIGLYCERIDES: Triglycerides: 79 mg/dL

## 2024-11-27 LAB — CBC
HCT: 35.5 % — ABNORMAL LOW (ref 36.0–46.0)
Hemoglobin: 11.6 g/dL — ABNORMAL LOW (ref 12.0–15.0)
MCH: 31.1 pg (ref 26.0–34.0)
MCHC: 32.7 g/dL (ref 30.0–36.0)
MCV: 95.2 fL (ref 80.0–100.0)
Platelets: 411 10*3/uL — ABNORMAL HIGH (ref 150–400)
RBC: 3.73 MIL/uL — ABNORMAL LOW (ref 3.87–5.11)
RDW: 12.8 % (ref 11.5–15.5)
WBC: 20.5 10*3/uL — ABNORMAL HIGH (ref 4.0–10.5)
nRBC: 0 % (ref 0.0–0.2)

## 2024-11-27 LAB — GLUCOSE, CAPILLARY
Glucose-Capillary: 150 mg/dL — ABNORMAL HIGH (ref 70–99)
Glucose-Capillary: 177 mg/dL — ABNORMAL HIGH (ref 70–99)
Glucose-Capillary: 111 mg/dL — ABNORMAL HIGH (ref 70–99)
Glucose-Capillary: 108 mg/dL — ABNORMAL HIGH (ref 70–99)
Glucose-Capillary: 117 mg/dL — ABNORMAL HIGH (ref 70–99)
Glucose-Capillary: 127 mg/dL — ABNORMAL HIGH (ref 70–99)

## 2024-11-27 LAB — PHOSPHORUS: Phosphorus: 4.7 mg/dL — ABNORMAL HIGH (ref 2.5–4.6)

## 2024-11-27 LAB — MAGNESIUM: Magnesium: 2.4 mg/dL (ref 1.7–2.4)

## 2024-11-27 MED ORDER — IPRATROPIUM-ALBUTEROL 0.5-2.5 (3) MG/3ML IN SOLN
3.0000 mL | Freq: Once | RESPIRATORY_TRACT | Status: AC
  Administered 2024-11-28: 3 mL via RESPIRATORY_TRACT
  Filled 2024-11-27: qty 3

## 2024-11-27 MED ORDER — DOCUSATE SODIUM 100 MG PO CAPS: 100.0000 mg | ORAL_CAPSULE | Freq: Two times a day (BID) | ORAL | Status: DC

## 2024-11-27 MED ORDER — SODIUM CHLORIDE 0.9 % IV SOLN
100.0000 mg | Freq: Two times a day (BID) | INTRAVENOUS | Status: DC
  Administered 2024-11-27 – 2024-11-28 (×3): 100 mg via INTRAVENOUS
  Filled 2024-11-27 (×4): qty 100

## 2024-11-27 MED ORDER — METHYLPREDNISOLONE SODIUM SUCC 40 MG IJ SOLR
40.0000 mg | Freq: Every day | INTRAMUSCULAR | Status: DC
  Administered 2024-11-28: 40 mg via INTRAVENOUS
  Filled 2024-11-27: qty 1

## 2024-11-27 MED ORDER — APIXABAN 5 MG PO TABS: 5.0000 mg | ORAL_TABLET | Freq: Two times a day (BID) | ORAL | Status: DC

## 2024-11-27 MED ORDER — STERILE WATER FOR INJECTION IJ SOLN
INTRAMUSCULAR | Status: AC
  Administered 2024-11-27: 10 mL
  Filled 2024-11-27: qty 10

## 2024-11-27 MED ORDER — LORAZEPAM 2 MG/ML IJ SOLN
INTRAMUSCULAR | Status: AC
  Administered 2024-11-27: 0.5 mg via INTRAVENOUS
  Filled 2024-11-27: qty 1

## 2024-11-27 MED ORDER — ZIPRASIDONE MESYLATE 20 MG IM SOLR
10.0000 mg | INTRAMUSCULAR | Status: AC | PRN
  Administered 2024-11-27 (×2): 10 mg via INTRAMUSCULAR
  Filled 2024-11-27 (×3): qty 20

## 2024-11-27 MED ORDER — IPRATROPIUM-ALBUTEROL 0.5-2.5 (3) MG/3ML IN SOLN
3.0000 mL | Freq: Four times a day (QID) | RESPIRATORY_TRACT | Status: DC | PRN
  Administered 2024-11-27 – 2024-11-28 (×2): 3 mL via RESPIRATORY_TRACT
  Filled 2024-11-27 (×2): qty 3

## 2024-11-27 MED ORDER — LORAZEPAM 2 MG/ML IJ SOLN: 0.5000 mg | Freq: Once | INTRAMUSCULAR | Status: AC

## 2024-11-27 MED ORDER — PERFLUTREN LIPID MICROSPHERE
1.0000 mL | INTRAVENOUS | Status: AC | PRN
  Administered 2024-11-27: 2 mL via INTRAVENOUS

## 2024-11-27 NOTE — Progress Notes (Signed)
 PT Cancellation Note  Patient Details Name: Stefanie Carter MRN: [id number] DOB: Mar 11, 1946   Cancelled Treatment:    Reason Eval/Treat Not Completed: Medical issues which prohibited therapy at this time, pt transferred to ICU and intubated last night, RN recommend therapy hold at this time. Will continue to follow and evaluate as appropriate.   Izetta Call, PT, DPT   Acute Rehabilitation Department Office [phone] Secure Chat Communication Preferred   Izetta JULIANNA Call 11/27/2024, 1:44 PM

## 2024-11-27 NOTE — Procedures (Signed)
 Intubation Procedure Note  Stefanie Carter  [id number]  16-Dec-1945  Date:11/27/24  Time:11:21 AM   Provider Performing:Venissa Nappi B Mavi Un    Procedure: Intubation (31500)  Indication(s) Respiratory Failure  Consent Unable to obtain consent due to emergent nature of procedure.   Anesthesia Etomidate , Fentanyl , and Rocuronium    Time Out Verified patient identification, verified procedure, site/side was marked, verified correct patient position, special equipment/implants available, medications/allergies/relevant history reviewed, required imaging and test results available.   Sterile Technique Usual hand hygeine, masks, and gloves were used   Procedure Description Patient positioned in bed supine.  Sedation given as noted above.  Patient was intubated with endotracheal tube using Glidescope.  View was Grade 1 full glottis .  Number of attempts was 1.  Colorimetric CO2 detector was consistent with tracheal placement.   Complications/Tolerance None; patient tolerated the procedure well. Chest X-ray is ordered to verify placement.   EBL Minimal   Specimen(s) None

## 2024-11-27 NOTE — Progress Notes (Signed)
 Cross covering ICU physician  Called emergently to assess for ett placement. Pt agitated with NIV in place on some non-invasive support. Extubated overnight. Pt has been intermittently agitated and disruptive to care since extubation.   Call placed to pt requested family all neighbors. Jenkins and Joe answered and endorsed that if pt is oriented (which she is to self, place, time, date, and reason for being here) then she is able to make decisions. They state that they have never had formal conversations with pt regarding end of life decisions but feel they can confidently state that should pt state she no longer wants ett and wants to just go on and pass peacefully and naturally, then she has indeed stated that she hoped her neighbors and preacher would allow her to do so as she requested. Neighbors also state pt has refused transport to hospital previously and during this encounter and that she reluctantly agreed after waking up from being decreased responsiveness prior to presentation.   At this time we will honor pt's wish to decline life saving ETT/ventilation (however she does not need it at this time regardless). Jenkins and Larnell will contact other neighbors. We will work to ensure that we are maintaining comfort for pt while also being aggressive with support to NIV to maintain pt's respiratory status per her wishes.   Updated RN and APP at bedside as well.

## 2024-11-27 NOTE — Consult Note (Signed)
 "  NAME:  Stefanie Carter, MRN:  [id number], DOB:  01/16/1946, LOS: 1 ADMISSION DATE:  11/25/2024, CONSULTATION DATE:  11/26/2024 REFERRING MD:  TRH, CHIEF COMPLAINT:  Unresponsiveness    History of Present Illness:  Stefanie Carter is a 79 y.o. with a past medical history significant for HTN, HLD, type 2 diabetes, prior cerebellar stroke, and hypothyroidism who presented to the ED 1/30 after suffering mechanical fall slipping on ice and trauma.  Patient initially refused transfer to the ER.  She also reported cough and congestion that began 48 hours prior to admission.  On ED arrival patient was seen hypoxic with oxygen saturation 87% ER attempted admission patient.  Patient was admitted per hospitalist for management of acute hypoxic respiratory failure and A-fib RVR.  Med afternoon 1/31 patient seen with development A-fib RVR prompting triage to progressive care.  Quickly upon transfer patient often developed significant change in mentation include unresponsiveness.  This prompted transfer and intubation for airway protection  Pertinent  Medical History  HTN, HLD, type 2 diabetes, prior cerebellar stroke, and hypothyroidism   Significant Hospital Events: Including procedures, antibiotic start and stop dates in addition to other pertinent events   1/30 presented initially for complaints of fall on ED arrival was seen hypoxic and in A-fib RVR 1/31 worsening hemodynamics with A-fib RVR and eventual development of unresponsiveness transferred to ICU and intubated  Interim History / Subjective:   Patient extubated overnight as she woke up and agitated with having tube in place She is on bipap, complains of difficulty breathing Having issues with urinary incontinence  Objective    Blood pressure (!) 114/100, pulse (!) 119, temperature 97.7 F (36.5 C), temperature source Axillary, resp. rate (!) 28, height 5' (1.524 m), weight 60 kg, SpO2 94%.    Vent Mode: PRVC FiO2 (%):  [40  %-100 %] 40 % Set Rate:  [28 bmp] 28 bmp Vt Set:  [370 mL] 370 mL PEEP:  [5 cmH20-8 cmH20] 8 cmH20 Plateau Pressure:  [24 cmH20-27 cmH20] 24 cmH20   Intake/Output Summary (Last 24 hours) at 11/27/2024 1148 Last data filed at 11/27/2024 0600 Gross per 24 hour  Intake 2420.91 ml  Output --  Net 2420.91 ml   Filed Weights   11/26/24 1400 11/27/24 0400  Weight: 59 kg 60 kg    Examination: General: ill appearing woman, on bipap, mild distress HEENT: ETT, MM pink/moist  Neuro: alert, moving all extremities CV: tachycardic, no murmur, rubs, or gallops,  PULM: diffuse wheezing GI: soft, bowel sounds active in all 4 quadrants, non-tender, non-distended Extremities: warm/dry, no edema  Skin: no rashes or lesions  Resolved problem list   Assessment and Plan  Acute on chronic hypoxic and hypercapnic respiratory failure Multifocal Pneumonia Former smoker COPD with acute exacerbation P: Continue bipap support Low threshold for re-intubation Follow cultures  Continue Pulmicort , Yupelri , and Brovana  Continue solumedrol 80mg  daily Precedex  as needed Cefepime  and doxycycline  for antibiotic coverage  New onset A-fib RVR - Since admission, decision made to stop home Plavix  utilized for prior CVA and exchanged for Eliquis . P: Unable to take PO eliquis , will hold anticoagulation for now Optimize electrolytes Continuous telemetry Echocardiogram pending Continue amiodarone   Hypothyroidism P: Continue home Synthroid  when able  Type 2 diabetes P: SSI CBG goal 140-180 CBG checks every 4  Hypertension P: As needed IV antihypertensives  Depression P: Hold home Zoloft   Neurogenic Bladder Vaginal Prolapse P Place foley for now while on bipap Return to intermittent cath in future Has  ring pessary in place  Labs   CBC: Recent Labs  Lab 11/25/24 1344 11/26/24 0501 11/26/24 1544 11/26/24 2004 11/26/24 2130 11/27/24 0110 11/27/24 0748  WBC 14.0* 15.2*  --   --   --   20.5*  --   NEUTROABS 10.7* 12.8*  --   --   --   --   --   HGB 13.2 11.6* 13.9 11.9* 13.9 11.6* 13.3  HCT 39.3 34.5* 41.0 35.0* 41.0 35.5* 39.0  MCV 92.5 93.0  --   --   --  95.2  --   PLT 431* 382  --   --   --  411*  --     Basic Metabolic Panel: Recent Labs  Lab 11/25/24 1344 11/26/24 0501 11/26/24 1529 11/26/24 1544 11/26/24 2004 11/26/24 2130 11/27/24 0110 11/27/24 0748  NA 146* 143  --  140 141 141 142 139  K 3.3* 4.1  --  3.9 4.0 4.2 3.8 4.0  CL 101 106  --   --   --   --  106  --   CO2 31 28  --   --   --   --  23  --   GLUCOSE 82 145*  --   --   --   --  170*  --   BUN 12 14  --   --   --   --  24*  --   CREATININE 0.71 0.74  --   --   --   --  1.07*  --   CALCIUM  10.3 9.0  --   --   --   --  8.8*  --   MG  --  1.8 2.6*  --   --   --  2.4  --   PHOS  --   --  5.6*  --   --   --  4.7*  --    GFR: Estimated Creatinine Clearance: 35.7 mL/min (A) (by C-G formula based on SCr of 1.07 mg/dL (H)). Recent Labs  Lab 11/25/24 1344 11/26/24 0501 11/26/24 1529 11/26/24 2151 11/27/24 0110  PROCALCITON  --   --  <0.10  --   --   WBC 14.0* 15.2*  --   --  20.5*  LATICACIDVEN  --   --  1.8 2.3*  --     Liver Function Tests: Recent Labs  Lab 11/25/24 1344 11/26/24 0501 11/27/24 0110  AST 51* 84* 100*  ALT 50* 78* 114*  ALKPHOS 116 104 108  BILITOT 0.5 0.3 0.2  PROT 7.5 6.3* 5.8*  ALBUMIN 4.0 3.4* 3.1*   No results for input(s): LIPASE, AMYLASE in the last 168 hours. No results for input(s): AMMONIA in the last 168 hours.  ABG    Component Value Date/Time   PHART 7.256 (L) 11/27/2024 0748   PCO2ART 54.6 (H) 11/27/2024 0748   PO2ART 73 (L) 11/27/2024 0748   HCO3 24.7 11/27/2024 0748   TCO2 26 11/27/2024 0748   ACIDBASEDEF 4.0 (H) 11/27/2024 0748   O2SAT 93 11/27/2024 0748     Coagulation Profile: No results for input(s): INR, PROTIME in the last 168 hours.  Cardiac Enzymes: No results for input(s): CKTOTAL, CKMB, CKMBINDEX,  TROPONINI in the last 168 hours.  HbA1C: Hgb A1c MFr Bld  Date/Time Value Ref Range Status  11/26/2024 05:01 AM 5.9 (H) 4.8 - 5.6 % Final    Comment:    (NOTE) Diagnosis of Diabetes The following HbA1c ranges recommended by the American Diabetes  Association (ADA) may be used as an aid in the diagnosis of diabetes mellitus.  Hemoglobin             Suggested A1C NGSP%              Diagnosis  <5.7                   Non Diabetic  5.7-6.4                Pre-Diabetic  >6.4                   Diabetic  <7.0                   Glycemic control for                       adults with diabetes.    07/21/2024 03:19 PM 6.7 (H) 4.8 - 5.6 % Final    Comment:             Prediabetes: 5.7 - 6.4          Diabetes: >6.4          Glycemic control for adults with diabetes: <7.0     CBG: Recent Labs  Lab 11/26/24 1503 11/26/24 1937 11/26/24 2357 11/27/24 0433 11/27/24 1042  GLUCAP 220* 139* 147* 150* 177*       Critical care time:   CRITICAL CARE Performed by: Dorn KATHEE Chill   Total critical care time: 35 minutes  Critical care time was exclusive of separately billable procedures and treating other patients.  Critical care was necessary to treat or prevent imminent or life-threatening deterioration.  Critical care was time spent personally by me on the following activities: development of treatment plan with patient and/or surrogate as well as nursing, discussions with consultants, evaluation of patient's response to treatment, examination of patient, obtaining history from patient or surrogate, ordering and performing treatments and interventions, ordering and review of laboratory studies, ordering and review of radiographic studies, pulse oximetry and re-evaluation of patient's condition.  Dorn Chill, MD [facility] Office: [phone]   See Amion for personal pager PCCM on call pager [phone] until 7pm. Please call Elink 7p-7a.  [phone]          "

## 2024-11-27 NOTE — Progress Notes (Signed)
 Cross covering ICU physician    Asked to change code status by [facility] team but due to constrictions on code orders. Unable to change code status. As per my note pt is DNI. NO INTUBATION. All other measures at this time are amendable per conversations. Should further goc be needed we can continue this. However at this time pt remains DNI only.

## 2024-11-27 NOTE — Progress Notes (Signed)
" °  Echocardiogram 2D Echocardiogram has been performed.  Koleen KANDICE Popper, RDCS 11/27/2024, 11:05 AM "

## 2024-11-27 DEATH — deceased

## 2024-11-28 DIAGNOSIS — E039 Hypothyroidism, unspecified: Secondary | ICD-10-CM | POA: Diagnosis not present

## 2024-11-28 DIAGNOSIS — I4891 Unspecified atrial fibrillation: Secondary | ICD-10-CM | POA: Diagnosis not present

## 2024-11-28 DIAGNOSIS — J441 Chronic obstructive pulmonary disease with (acute) exacerbation: Secondary | ICD-10-CM | POA: Diagnosis not present

## 2024-11-28 DIAGNOSIS — I10 Essential (primary) hypertension: Secondary | ICD-10-CM | POA: Diagnosis not present

## 2024-11-28 DIAGNOSIS — Z87891 Personal history of nicotine dependence: Secondary | ICD-10-CM | POA: Diagnosis not present

## 2024-11-28 DIAGNOSIS — J9621 Acute and chronic respiratory failure with hypoxia: Secondary | ICD-10-CM | POA: Diagnosis not present

## 2024-11-28 DIAGNOSIS — J9622 Acute and chronic respiratory failure with hypercapnia: Secondary | ICD-10-CM | POA: Diagnosis not present

## 2024-11-28 DIAGNOSIS — E119 Type 2 diabetes mellitus without complications: Secondary | ICD-10-CM | POA: Diagnosis not present

## 2024-11-28 LAB — POCT I-STAT 7, (LYTES, BLD GAS, ICA,H+H)
Acid-base deficit: 1 mmol/L (ref 0.0–2.0)
Bicarbonate: 26.3 mmol/L (ref 20.0–28.0)
Calcium, Ion: 1.27 mmol/L (ref 1.15–1.40)
HCT: 35 % — ABNORMAL LOW (ref 36.0–46.0)
Hemoglobin: 11.9 g/dL — ABNORMAL LOW (ref 12.0–15.0)
O2 Saturation: 100 %
Potassium: 4.1 mmol/L (ref 3.5–5.1)
Sodium: 143 mmol/L (ref 135–145)
TCO2: 28 mmol/L (ref 22–32)
pCO2 arterial: 55.2 mmHg — ABNORMAL HIGH (ref 32–48)
pH, Arterial: 7.287 — ABNORMAL LOW (ref 7.35–7.45)
pO2, Arterial: 467 mmHg — ABNORMAL HIGH (ref 83–108)

## 2024-11-28 LAB — BASIC METABOLIC PANEL WITH GFR
Anion gap: 15 (ref 5–15)
BUN: 39 mg/dL — ABNORMAL HIGH (ref 8–23)
CO2: 24 mmol/L (ref 22–32)
Calcium: 8.7 mg/dL — ABNORMAL LOW (ref 8.9–10.3)
Chloride: 105 mmol/L (ref 98–111)
Creatinine, Ser: 1.05 mg/dL — ABNORMAL HIGH (ref 0.44–1.00)
GFR, Estimated: 54 mL/min — ABNORMAL LOW
Glucose, Bld: 124 mg/dL — ABNORMAL HIGH (ref 70–99)
Potassium: 3.9 mmol/L (ref 3.5–5.1)
Sodium: 144 mmol/L (ref 135–145)

## 2024-11-28 LAB — GLUCOSE, CAPILLARY
Glucose-Capillary: 82 mg/dL (ref 70–99)
Glucose-Capillary: 49 mg/dL — ABNORMAL LOW (ref 70–99)
Glucose-Capillary: 116 mg/dL — ABNORMAL HIGH (ref 70–99)

## 2024-11-28 LAB — CBC WITH DIFFERENTIAL/PLATELET
Abs Immature Granulocytes: 0.31 10*3/uL — ABNORMAL HIGH (ref 0.00–0.07)
Basophils Absolute: 0.1 10*3/uL (ref 0.0–0.1)
Basophils Relative: 0 %
Eosinophils Absolute: 0 10*3/uL (ref 0.0–0.5)
Eosinophils Relative: 0 %
HCT: 35.8 % — ABNORMAL LOW (ref 36.0–46.0)
Hemoglobin: 11.6 g/dL — ABNORMAL LOW (ref 12.0–15.0)
Immature Granulocytes: 1 %
Lymphocytes Relative: 7 %
Lymphs Abs: 1.7 10*3/uL (ref 0.7–4.0)
MCH: 30.9 pg (ref 26.0–34.0)
MCHC: 32.4 g/dL (ref 30.0–36.0)
MCV: 95.2 fL (ref 80.0–100.0)
Monocytes Absolute: 2 10*3/uL — ABNORMAL HIGH (ref 0.1–1.0)
Monocytes Relative: 8 %
Neutro Abs: 20.1 10*3/uL — ABNORMAL HIGH (ref 1.7–7.7)
Neutrophils Relative %: 84 %
Platelets: 400 10*3/uL (ref 150–400)
RBC: 3.76 MIL/uL — ABNORMAL LOW (ref 3.87–5.11)
RDW: 12.8 % (ref 11.5–15.5)
WBC: 24.2 10*3/uL — ABNORMAL HIGH (ref 4.0–10.5)
nRBC: 0 % (ref 0.0–0.2)

## 2024-11-28 LAB — PHOSPHORUS: Phosphorus: 3.5 mg/dL (ref 2.5–4.6)

## 2024-11-28 LAB — MAGNESIUM: Magnesium: 2.2 mg/dL (ref 1.7–2.4)

## 2024-11-28 MED ORDER — GLYCOPYRROLATE 0.2 MG/ML IJ SOLN: 0.2000 mg | INTRAMUSCULAR | Status: DC | PRN

## 2024-11-28 MED ORDER — POLYVINYL ALCOHOL 1.4 % OP SOLN: 1.0000 [drp] | Freq: Four times a day (QID) | OPHTHALMIC | Status: DC | PRN

## 2024-11-28 MED ORDER — ACETAMINOPHEN 325 MG PO TABS: 650.0000 mg | ORAL_TABLET | Freq: Four times a day (QID) | ORAL | Status: DC | PRN

## 2024-11-28 MED ORDER — MORPHINE BOLUS VIA INFUSION
5.0000 mg | INTRAVENOUS | Status: DC | PRN
  Administered 2024-11-28: 5 mg via INTRAVENOUS

## 2024-11-28 MED ORDER — MIDAZOLAM HCL (PF) 2 MG/2ML IJ SOLN: 2.0000 mg | INTRAMUSCULAR | Status: DC | PRN

## 2024-11-28 MED ORDER — LORAZEPAM 2 MG/ML IJ SOLN
0.5000 mg | Freq: Four times a day (QID) | INTRAMUSCULAR | Status: AC | PRN
  Administered 2024-11-28 (×2): 0.5 mg via INTRAVENOUS
  Filled 2024-11-28 (×2): qty 1

## 2024-11-28 MED ORDER — LORAZEPAM 2 MG/ML IJ SOLN: 0.5000 mg | Freq: Four times a day (QID) | INTRAMUSCULAR | Status: DC | PRN

## 2024-11-28 MED ORDER — MORPHINE 100MG IN NS 100ML (1MG/ML) PREMIX INFUSION
0.0000 mg/h | INTRAVENOUS | Status: DC
  Administered 2024-11-28: 5 mg/h via INTRAVENOUS
  Filled 2024-11-28: qty 100

## 2024-11-28 MED ORDER — LORAZEPAM 2 MG/ML IJ SOLN
0.5000 mg | Freq: Once | INTRAMUSCULAR | Status: AC
  Administered 2024-11-28: 0.5 mg via INTRAVENOUS
  Filled 2024-11-28: qty 1

## 2024-11-28 MED ORDER — MORPHINE SULFATE (PF) 2 MG/ML IV SOLN
2.0000 mg | Freq: Once | INTRAVENOUS | Status: AC
  Administered 2024-11-28: 2 mg via INTRAVENOUS

## 2024-11-28 MED ORDER — ZIPRASIDONE MESYLATE 20 MG IM SOLR
10.0000 mg | Freq: Once | INTRAMUSCULAR | Status: DC
  Filled 2024-11-28: qty 20

## 2024-11-28 MED ORDER — DEXTROSE 10 % IV SOLN: INTRAVENOUS | Status: DC

## 2024-11-28 MED ORDER — GLYCOPYRROLATE 1 MG PO TABS: 1.0000 mg | ORAL_TABLET | ORAL | Status: DC | PRN

## 2024-11-28 MED ORDER — ACETAMINOPHEN 650 MG RE SUPP: 650.0000 mg | Freq: Four times a day (QID) | RECTAL | Status: DC | PRN

## 2024-11-28 MED ORDER — STERILE WATER FOR INJECTION IJ SOLN
INTRAMUSCULAR | Status: AC
  Filled 2024-11-28: qty 10

## 2024-11-28 MED ORDER — MORPHINE SULFATE (PF) 2 MG/ML IV SOLN
INTRAVENOUS | Status: AC
  Filled 2024-11-28: qty 1

## 2024-11-28 MED ORDER — DEXTROSE 50 % IV SOLN
INTRAVENOUS | Status: AC
  Administered 2024-11-28: 25 mL
  Filled 2024-11-28: qty 50

## 2024-11-28 MED ORDER — SODIUM CHLORIDE 0.9 % IV SOLN: INTRAVENOUS | Status: DC

## 2024-11-29 LAB — CULTURE, RESPIRATORY W GRAM STAIN: Culture: NO GROWTH

## 2024-12-25 NOTE — Progress Notes (Signed)
 Pt requested for friend and neighbor to be called to discuss final wishes. Pt requesting to be made comfort care. Md notified and DNR placed. Pt very agitated and struggling to breathe. Visitors arrived and pt asked for medication to be given. IV Morphine  started and IV ativan  given. Pt passed away at 1700 with friends at bedside. Juliene gave funeral home name and support was given to patient and visitors.

## 2024-12-25 NOTE — Plan of Care (Signed)
" °  Problem: Elimination: Goal: Will not experience complications related to urinary retention Outcome: Progressing   Problem: Pain Managment: Goal: General experience of comfort will improve and/or be controlled Outcome: Progressing   Problem: Skin Integrity: Goal: Risk for impaired skin integrity will decrease Outcome: Progressing   Problem: Respiratory: Goal: Ability to maintain a clear airway will improve Outcome: Progressing Goal: Levels of oxygenation will improve Outcome: Progressing   Problem: Coping: Goal: Level of anxiety will decrease Outcome: Not Progressing   "

## 2024-12-25 NOTE — Progress Notes (Signed)
 Heart Failure Navigator Progress Note  Assessed for Heart & Vascular TOC clinic readiness.  Patient does not meet criteria due to per notes patient will be transitioning to comfort Care. No HF TOC. .   Navigator will sign off at this time.   Stephane Haddock, BSN, Scientist, Clinical (histocompatibility And Immunogenetics) Only

## 2024-12-25 NOTE — IPAL (Signed)
" °  Interdisciplinary Goals of Care Family Meeting   Date carried out: 2024/12/13  Location of the meeting: Bedside  Member's involved: Physician and Respiratory Therapist  Durable Power of Attorney or acting medical decision maker: Patient    Discussion: We discussed goals of care for [employer] .  I spoke with patient directly. She does not want to use bipap at this time. Reviewed code status and previously documented was no intubation but she wanted chest compressions. I discussed with her that this is not an option, that if we do CPR that we intubate as well. She reports she is tired of living and does not want on going medical treatment. We discussed transitioning to comfort care and what that would entail. Reviewed that if she does not want intubation that we would not do chest compression, making her a DNR/DNI, which she agreed to. She requested reaching out to her pastor, which I have done and her friends Arlean and [name]. They plan to come in this morning.  Code status:   Code Status: Limited: Do not attempt resuscitation (DNR) -DNR-LIMITED -Do Not Intubate/DNI    Disposition: In-patient comfort care  Time spent for the meeting: 15 minutes.     Dorn KATHEE Chill, MD  12-13-24, 8:16 AM   "

## 2024-12-25 DEATH — deceased

## 2025-01-18 ENCOUNTER — Ambulatory Visit: Admitting: Podiatry

## 2025-05-16 ENCOUNTER — Ambulatory Visit: Admitting: Neurology
